# Patient Record
Sex: Male | Born: 1965 | Race: White | Hispanic: No | Marital: Married | Smoking: Never smoker
Health system: Southern US, Community
[De-identification: ages and names within clinical notes are randomized; demographics above are authoritative.]

## PROBLEM LIST (undated history)

## (undated) DIAGNOSIS — I10 Essential (primary) hypertension: Secondary | ICD-10-CM

## (undated) DIAGNOSIS — C6290 Malignant neoplasm of unspecified testis, unspecified whether descended or undescended: Secondary | ICD-10-CM

## (undated) DIAGNOSIS — N289 Disorder of kidney and ureter, unspecified: Secondary | ICD-10-CM

## (undated) DIAGNOSIS — I639 Cerebral infarction, unspecified: Secondary | ICD-10-CM

## (undated) DIAGNOSIS — I509 Heart failure, unspecified: Secondary | ICD-10-CM

## (undated) DIAGNOSIS — I4891 Unspecified atrial fibrillation: Secondary | ICD-10-CM

## (undated) HISTORY — PX: OTHER SURGICAL HISTORY: SHX169

## (undated) HISTORY — PX: CARDIAC ELECTROPHYSIOLOGY MAPPING AND ABLATION: SHX1292

---

## 2017-08-26 ENCOUNTER — Other Ambulatory Visit: Payer: Self-pay

## 2017-08-26 ENCOUNTER — Emergency Department
Admission: EM | Admit: 2017-08-26 | Discharge: 2017-08-26 | Disposition: A | Discharge status: Home / Self Care | Payer: No Typology Code available for payment source | Attending: Emergency Medicine | Admitting: Emergency Medicine

## 2017-08-26 ENCOUNTER — Emergency Department: Payer: No Typology Code available for payment source

## 2017-08-26 DIAGNOSIS — I482 Chronic atrial fibrillation: Secondary | ICD-10-CM | POA: Insufficient documentation

## 2017-08-26 DIAGNOSIS — I4821 Permanent atrial fibrillation: Secondary | ICD-10-CM

## 2017-08-26 DIAGNOSIS — Z8547 Personal history of malignant neoplasm of testis: Secondary | ICD-10-CM | POA: Insufficient documentation

## 2017-08-26 DIAGNOSIS — Z8673 Personal history of transient ischemic attack (TIA), and cerebral infarction without residual deficits: Secondary | ICD-10-CM | POA: Insufficient documentation

## 2017-08-26 DIAGNOSIS — I509 Heart failure, unspecified: Secondary | ICD-10-CM | POA: Diagnosis not present

## 2017-08-26 DIAGNOSIS — R110 Nausea: Secondary | ICD-10-CM | POA: Insufficient documentation

## 2017-08-26 DIAGNOSIS — R0602 Shortness of breath: Secondary | ICD-10-CM | POA: Diagnosis present

## 2017-08-26 DIAGNOSIS — R002 Palpitations: Secondary | ICD-10-CM

## 2017-08-26 DIAGNOSIS — I110 Hypertensive heart disease with heart failure: Secondary | ICD-10-CM | POA: Insufficient documentation

## 2017-08-26 DIAGNOSIS — R52 Pain, unspecified: Secondary | ICD-10-CM | POA: Diagnosis not present

## 2017-08-26 HISTORY — DX: Heart failure, unspecified: I50.9

## 2017-08-26 HISTORY — DX: Essential (primary) hypertension: I10

## 2017-08-26 HISTORY — DX: Malignant neoplasm of unspecified testis, unspecified whether descended or undescended: C62.90

## 2017-08-26 HISTORY — DX: Cerebral infarction, unspecified: I63.9

## 2017-08-26 HISTORY — DX: Unspecified atrial fibrillation: I48.91

## 2017-08-26 HISTORY — DX: Disorder of kidney and ureter, unspecified: N28.9

## 2017-08-26 LAB — CBC WITH DIFFERENTIAL/PLATELET
BASOS ABS: 0 10*3/uL (ref 0–0.1)
Basophils Relative: 1 %
EOS ABS: 0.1 10*3/uL (ref 0–0.7)
EOS PCT: 2 %
HCT: 45.3 % (ref 40.0–52.0)
HEMOGLOBIN: 15.1 g/dL (ref 13.0–18.0)
LYMPHS ABS: 0.9 10*3/uL — AB (ref 1.0–3.6)
Lymphocytes Relative: 19 %
MCH: 31 pg (ref 26.0–34.0)
MCHC: 33.4 g/dL (ref 32.0–36.0)
MCV: 92.8 fL (ref 80.0–100.0)
Monocytes Absolute: 0.5 10*3/uL (ref 0.2–1.0)
Monocytes Relative: 10 %
NEUTROS PCT: 68 %
Neutro Abs: 3.2 10*3/uL (ref 1.4–6.5)
Platelets: 137 10*3/uL — ABNORMAL LOW (ref 150–440)
RBC: 4.88 MIL/uL (ref 4.40–5.90)
RDW: 16.4 % — ABNORMAL HIGH (ref 11.5–14.5)
WBC: 4.6 10*3/uL (ref 3.8–10.6)

## 2017-08-26 LAB — COMPREHENSIVE METABOLIC PANEL
ALT: 62 U/L (ref 17–63)
ANION GAP: 12 (ref 5–15)
AST: 83 U/L — AB (ref 15–41)
Albumin: 4.6 g/dL (ref 3.5–5.0)
Alkaline Phosphatase: 87 U/L (ref 38–126)
BILIRUBIN TOTAL: 1.8 mg/dL — AB (ref 0.3–1.2)
BUN: 16 mg/dL (ref 6–20)
CHLORIDE: 106 mmol/L (ref 101–111)
CO2: 17 mmol/L — ABNORMAL LOW (ref 22–32)
Calcium: 9.6 mg/dL (ref 8.9–10.3)
Creatinine, Ser: 1.38 mg/dL — ABNORMAL HIGH (ref 0.61–1.24)
GFR calc Af Amer: >60 mL/min (ref >60)
GFR calc non Af Amer: 59 mL/min — ABNORMAL LOW (ref >60)
Glucose, Bld: 105 mg/dL — ABNORMAL HIGH (ref 65–99)
POTASSIUM: 5.2 mmol/L — AB (ref 3.5–5.1)
Sodium: 135 mmol/L (ref 135–145)
TOTAL PROTEIN: 8.1 g/dL (ref 6.5–8.1)

## 2017-08-26 LAB — BRAIN NATRIURETIC PEPTIDE: B NATRIURETIC PEPTIDE 5: 38 pg/mL (ref 0.0–100.0)

## 2017-08-26 LAB — TROPONIN I: Troponin I: <0.03 ng/mL (ref <0.03)

## 2017-08-26 LAB — LIPASE, BLOOD: Lipase: 34 U/L (ref 11–51)

## 2017-08-26 MED ORDER — ASPIRIN 81 MG PO CHEW
162.0000 mg | CHEWABLE_TABLET | Freq: Once | ORAL | Status: AC
Start: 2017-08-26 — End: 2017-08-26
  Administered 2017-08-26: 162 mg via ORAL
  Filled 2017-08-26: qty 2

## 2017-08-26 MED ORDER — PROCHLORPERAZINE EDISYLATE 10 MG/2ML IJ SOLN
10.0000 mg | Freq: Once | INTRAMUSCULAR | Status: AC
  Administered 2017-08-26: 10 mg via INTRAVENOUS
  Filled 2017-08-26: qty 2

## 2017-08-26 NOTE — ED Notes (Signed)
Pt stating he is feeling better, states "I'm ready to go home." MD informed. Pt appears more relaxed at this time.

## 2017-08-26 NOTE — ED Provider Notes (Signed)
[facility] Emergency Department Provider Note  ____________________________________________   First MD Initiated Contact with Patient 08/26/17 [phone]     (approximate)  I have reviewed the triage vital signs and the nursing notes.   HISTORY  Chief Complaint Dizziness and Nausea   HPI Dustin Wells is a 52 y.o. male who comes to the emergency department via EMS with shortness of breath, nausea, several episodes of emesis, and lightheadedness for the past several days.  Symptoms came on gradually and have been intermittent.  His dizziness is not "room spinning".  It is not completely constant.  It is somewhat worse when getting up and somewhat improved with sitting down and resting.  He has a long-standing history of atrial fibrillation and CHF and today he took his last dose of Eliquis and he took flecainide most recently 3 days ago.  He denies syncope.  His symptoms are intermittent and moderate in severity when they occur.  Past Medical History:  Diagnosis Date  . Atrial fibrillation ([place])   . CHF (congestive heart failure) ([place])   . Hypertension   . Renal disorder   . Stroke ([place])   . Testicular cancer ([place])     There are no active problems to display for this patient.   Past Surgical History:  Procedure Laterality Date  . cardiac albation    . CARDIAC ELECTROPHYSIOLOGY MAPPING AND ABLATION      Prior to Admission medications   Not on File    Allergies Patient has no known allergies.  History reviewed. No pertinent family history.  Social History Social History   Tobacco Use  . Smoking status: Never Smoker  Substance Use Topics  . Alcohol use: Yes  . Drug use: Not on file    Review of Systems Constitutional: No fever/chills Eyes: No visual changes. ENT: No sore throat. Cardiovascular: Denies chest pain. Respiratory: Positive for shortness of breath. Gastrointestinal: No abdominal pain.  Positive for nausea, positive for vomiting.   No diarrhea.  No constipation. Genitourinary: Negative for dysuria. Musculoskeletal: Negative for back pain. Skin: Negative for rash. Neurological: Negative for headaches, focal weakness or numbness.   ____________________________________________   PHYSICAL EXAM:  VITAL SIGNS: ED Triage Vitals [08/26/17 0944]  Enc Vitals Group     BP (!) 160/98     Pulse Rate 78     Resp 16     Temp 98 F (36.7 C)     Temp Source Oral     SpO2 95 %     Weight      Height      Head Circumference      Peak Flow      Pain Score      Pain Loc      Pain Edu?      Excl. in [place]?     Constitutional: Alert and oriented x4 although somewhat uncomfortable appearing Eyes: PERRL EOMI. no nystagmus Head: Atraumatic. Nose: No congestion/rhinnorhea. Mouth/Throat: No trismus Neck: No stridor.   Cardiovascular: Irregularly irregular and slightly bradycardic Respiratory: Lightly increased respiratory effort.  No retractions. Lungs CTAB and moving good air Gastrointestinal: Soft nondistended nontender no rebound or guarding no peritonitis Musculoskeletal: No lower extremity edema   Neurologic:  Normal speech and language. No gross focal neurologic deficits are appreciated. Skin:  Skin is warm, dry and intact. No rash noted. Psychiatric: Anxious appearing    ____________________________________________   DIFFERENTIAL includes but not limited to  Atrial fibrillation, rapid ventricular response, SVT, metabolic arrangement, ventricular  tachycardia, acute coronary syndrome, panic attack ____________________________________________   LABS (all labs ordered are listed, but only abnormal results are displayed)  Labs Reviewed  COMPREHENSIVE METABOLIC PANEL - Abnormal; Notable for the following components:      Result Value   Potassium 5.2 (*)    CO2 17 (*)    Glucose, Bld 105 (*)    Creatinine, Ser 1.38 (*)    AST 83 (*)    Total Bilirubin 1.8 (*)    GFR calc non Af Amer 59 (*)    All other  components within normal limits  CBC WITH DIFFERENTIAL/PLATELET - Abnormal; Notable for the following components:   RDW 16.4 (*)    Platelets 137 (*)    Lymphs Abs 0.9 (*)    All other components within normal limits  TROPONIN I  LIPASE, BLOOD  BRAIN NATRIURETIC PEPTIDE    Lab work reviewed by me with no acute disease and no signs of acute ischemia __________________________________________  EKG  ED ECG REPORT I, Darel Hong, the attending physician, personally viewed and interpreted this ECG.  Date: 08/26/2017 EKG Time:  Rate: 68 Rhythm: Atrial fibrillation QRS Axis: normal Intervals: normal ST/T Wave abnormalities: normal Narrative Interpretation: no evidence of acute ischemia  ____________________________________________  RADIOLOGY  Chest x-ray reviewed by me with no acute disease ____________________________________________   PROCEDURES  Procedure(s) performed: no  Procedures  Critical Care performed: no  Observation: no ____________________________________________   INITIAL IMPRESSION / ASSESSMENT AND PLAN / ED COURSE  Pertinent labs & imaging results that were available during my care of the patient were reviewed by me and considered in my medical decision making (see chart for details).     ----------------------------------------- 9:45 AM on 08/26/2017 -----------------------------------------  The patient arrives uncomfortable appearing with elevated respiratory rate although with clear lungs.  He is in atrial fibrillation although rate controlled at this point.  Broad work-up is pending including chest x-ray cardiac enzymes, electrolytes.  Will reevaluate thereafter.  The patient's work-up is fortunately reassuring.  He does not need refills of any of his medications as they are waiting for him at the pharmacy.  On further discussion the patient states he has had significant anxiety and he feels like he was having a panic attack earlier which  actually sounds about right.  At this point he is medically stable for outpatient management verbalizes understanding agreement the plan.  ____________________________________________   FINAL CLINICAL IMPRESSION(S) / ED DIAGNOSES  Final diagnoses:  Pain  Nausea  Palpitations  Permanent atrial fibrillation ([place])      NEW MEDICATIONS STARTED DURING THIS VISIT:  There are no discharge medications for this patient.    Note:  This document was prepared using Dragon voice recognition software and may include unintentional dictation errors.     Darel Hong, MD 08/28/17 1351

## 2017-08-26 NOTE — Discharge Instructions (Signed)
It was a pleasure to take care of you today, and thank you for coming to our emergency department.  If you have any questions or concerns before leaving please ask the nurse to grab me and I'm more than happy to go through your aftercare instructions again.  If you were prescribed any opioid pain medication today such as Norco, Vicodin, Percocet, morphine, hydrocodone, or oxycodone please make sure you do not drive when you are taking this medication as it can alter your ability to drive safely.  If you have any concerns once you are home that you are not improving or are in fact getting worse before you can make it to your follow-up appointment, please do not hesitate to call 911 and come back for further evaluation.  Darel Hong, MD  Results for orders placed or performed during the hospital encounter of 08/26/17  Comprehensive metabolic panel  Result Value Ref Range   Sodium 135 135 - 145 mmol/L   Potassium 5.2 (H) 3.5 - 5.1 mmol/L   Chloride 106 101 - 111 mmol/L   CO2 17 (L) 22 - 32 mmol/L   Glucose, Bld 105 (H) 65 - 99 mg/dL   BUN 16 6 - 20 mg/dL   Creatinine, Ser 1.38 (H) 0.61 - 1.24 mg/dL   Calcium 9.6 8.9 - 10.3 mg/dL   Total Protein 8.1 6.5 - 8.1 g/dL   Albumin 4.6 3.5 - 5.0 g/dL   AST 83 (H) 15 - 41 U/L   ALT 62 17 - 63 U/L   Alkaline Phosphatase 87 38 - 126 U/L   Total Bilirubin 1.8 (H) 0.3 - 1.2 mg/dL   GFR calc non Af Amer 59 (L) >60 mL/min   GFR calc Af Amer >60 >60 mL/min   Anion gap 12 5 - 15  Troponin I  Result Value Ref Range   Troponin I <0.03 <0.03 ng/mL  Lipase, blood  Result Value Ref Range   Lipase 34 11 - 51 U/L  Brain natriuretic peptide  Result Value Ref Range   B Natriuretic Peptide 38.0 0.0 - 100.0 pg/mL  CBC with Differential  Result Value Ref Range   WBC 4.6 3.8 - 10.6 K/uL   RBC 4.88 4.40 - 5.90 MIL/uL   Hemoglobin 15.1 13.0 - 18.0 g/dL   HCT 45.3 40.0 - 52.0 %   MCV 92.8 80.0 - 100.0 fL   MCH 31.0 26.0 - 34.0 pg   MCHC 33.4 32.0 - 36.0  g/dL   RDW 16.4 (H) 11.5 - 14.5 %   Platelets 137 (L) 150 - 440 K/uL   Neutrophils Relative % 68 %   Neutro Abs 3.2 1.4 - 6.5 K/uL   Lymphocytes Relative 19 %   Lymphs Abs 0.9 (L) 1.0 - 3.6 K/uL   Monocytes Relative 10 %   Monocytes Absolute 0.5 0.2 - 1.0 K/uL   Eosinophils Relative 2 %   Eosinophils Absolute 0.1 0 - 0.7 K/uL   Basophils Relative 1 %   Basophils Absolute 0.0 0 - 0.1 K/uL   Dg Chest Port 1 View  Result Date: 08/26/2017 CLINICAL DATA:  Shortness of breath EXAM: PORTABLE CHEST 1 VIEW COMPARISON:  None. FINDINGS: Mild elevation of the left diaphragm. The lungs are clear. Normal heart size and mediastinal contours. IMPRESSION: 1. No evidence of active disease. 2. Mild elevation of the left diaphragm. Electronically Signed   By: Monte Fantasia M.D.   On: 08/26/2017 10:54   US Abdomen Limited Ruq  Result Date: 08/26/2017  CLINICAL DATA:  Abdominal pain with nausea vomiting for 1 day EXAM: ULTRASOUND ABDOMEN LIMITED RIGHT UPPER QUADRANT COMPARISON:  None. FINDINGS: Gallbladder: No gallstones or wall thickening visualized. No sonographic Murphy sign noted by sonographer. Common bile duct: Diameter: 4 mm Liver: No focal lesion identified. Within normal limits in parenchymal echogenicity. Portal vein is patent on color Doppler imaging with normal direction of blood flow towards the liver. IMPRESSION: Negative for gallstones or biliary dilatation. No acute finding by ultrasound. Electronically Signed   By: Jerilynn Mages.  Shick M.D.   On: 08/26/2017 11:46

## 2017-08-26 NOTE — ED Notes (Signed)
Pt taken to US

## 2017-08-26 NOTE — ED Triage Notes (Signed)
Pt arrives to ED via [facility] for dizziness, SOB, N&V. Pt states hx of a fib and states he has been out of flecanide x few days. Hx stroke, takes eliquis. Denies taking ASA. Alert, oriented, shaking upon arrival. States symptoms began yesterday. Took ativan at home, when he woke up was feeling dizzy. EMS gave 4mg  zofran en route. Arrives 20 L hand. No pain per EMS. Pt had ablation at [facility] April 1st.

## 2019-06-11 ENCOUNTER — Telehealth (HOSPITAL_COMMUNITY): Payer: Self-pay | Admitting: Psychiatry

## 2019-06-11 NOTE — Telephone Encounter (Signed)
D:  Pt referred to CD-IOP per referral coordinator Jonelle Sidle).  A:  Placed call to pt to discuss CD-IOP.  Pt states he would prefer individual counseling instead.  Informed Jonelle Sidle.

## 2019-07-01 ENCOUNTER — Telehealth (HOSPITAL_COMMUNITY): Payer: Self-pay | Admitting: Licensed Clinical Social Worker

## 2019-07-01 ENCOUNTER — Encounter (HOSPITAL_COMMUNITY): Payer: Self-pay | Admitting: Licensed Clinical Social Worker

## 2019-07-01 ENCOUNTER — Other Ambulatory Visit: Payer: Self-pay

## 2019-07-01 ENCOUNTER — Ambulatory Visit (INDEPENDENT_AMBULATORY_CARE_PROVIDER_SITE_OTHER): Payer: 59 | Admitting: Licensed Clinical Social Worker

## 2019-07-01 DIAGNOSIS — F411 Generalized anxiety disorder: Secondary | ICD-10-CM

## 2019-07-01 DIAGNOSIS — F331 Major depressive disorder, recurrent, moderate: Secondary | ICD-10-CM | POA: Diagnosis not present

## 2019-07-01 DIAGNOSIS — F1020 Alcohol dependence, uncomplicated: Secondary | ICD-10-CM | POA: Diagnosis not present
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
## 2019-07-01 NOTE — Telephone Encounter (Signed)
Called patient suggesting he go to detox at [facility] and the social worker there will find him a cd-iop program. CD iop program at [facility] is full and cannot begin until 4/21 or 4/26 per K. Brone, facilitator. lisbeth mackenzie, LCAS

## 2019-07-02 ENCOUNTER — Telehealth (HOSPITAL_COMMUNITY): Payer: Self-pay | Admitting: Licensed Clinical Social Worker

## 2019-07-02 NOTE — Telephone Encounter (Signed)
See notes

## 2019-07-10 ENCOUNTER — Emergency Department
Admission: EM | Admit: 2019-07-10 | Discharge: 2019-07-10 | Disposition: A | Discharge status: Home / Self Care | Payer: 59 | Attending: Student | Admitting: Student

## 2019-07-10 ENCOUNTER — Other Ambulatory Visit: Payer: Self-pay

## 2019-07-10 DIAGNOSIS — I509 Heart failure, unspecified: Secondary | ICD-10-CM | POA: Insufficient documentation

## 2019-07-10 DIAGNOSIS — F10129 Alcohol abuse with intoxication, unspecified: Secondary | ICD-10-CM | POA: Insufficient documentation

## 2019-07-10 DIAGNOSIS — Z79899 Other long term (current) drug therapy: Secondary | ICD-10-CM | POA: Insufficient documentation

## 2019-07-10 DIAGNOSIS — F1010 Alcohol abuse, uncomplicated: Secondary | ICD-10-CM

## 2019-07-10 DIAGNOSIS — I110 Hypertensive heart disease with heart failure: Secondary | ICD-10-CM | POA: Insufficient documentation

## 2019-07-10 DIAGNOSIS — Y905 Blood alcohol level of 100-119 mg/100 ml: Secondary | ICD-10-CM | POA: Diagnosis not present

## 2019-07-10 LAB — CBC
HCT: 42 % (ref 39.0–52.0)
Hemoglobin: 14.2 g/dL (ref 13.0–17.0)
MCH: 34.6 pg — ABNORMAL HIGH (ref 26.0–34.0)
MCHC: 33.8 g/dL (ref 30.0–36.0)
MCV: 102.4 fL — ABNORMAL HIGH (ref 80.0–100.0)
Platelets: 172 10*3/uL (ref 150–400)
RBC: 4.1 MIL/uL — ABNORMAL LOW (ref 4.22–5.81)
RDW: 13.2 % (ref 11.5–15.5)
WBC: 3.5 10*3/uL — ABNORMAL LOW (ref 4.0–10.5)
nRBC: 0 % (ref 0.0–0.2)

## 2019-07-10 LAB — COMPREHENSIVE METABOLIC PANEL
ALT: 74 U/L — ABNORMAL HIGH (ref 0–44)
AST: 106 U/L — ABNORMAL HIGH (ref 15–41)
Albumin: 4 g/dL (ref 3.5–5.0)
Alkaline Phosphatase: 100 U/L (ref 38–126)
Anion gap: 12 (ref 5–15)
BUN: 18 mg/dL (ref 6–20)
CO2: 21 mmol/L — ABNORMAL LOW (ref 22–32)
Calcium: 9.4 mg/dL (ref 8.9–10.3)
Chloride: 105 mmol/L (ref 98–111)
Creatinine, Ser: 1.37 mg/dL — ABNORMAL HIGH (ref 0.61–1.24)
GFR calc Af Amer: >60 mL/min (ref >60)
GFR calc non Af Amer: 59 mL/min — ABNORMAL LOW (ref >60)
Glucose, Bld: 110 mg/dL — ABNORMAL HIGH (ref 70–99)
Potassium: 3.9 mmol/L (ref 3.5–5.1)
Sodium: 138 mmol/L (ref 135–145)
Total Bilirubin: 0.7 mg/dL (ref 0.3–1.2)
Total Protein: 7.9 g/dL (ref 6.5–8.1)

## 2019-07-10 LAB — URINE DRUG SCREEN, QUALITATIVE (ARMC ONLY)
Amphetamines, Ur Screen: NOT DETECTED
Barbiturates, Ur Screen: NOT DETECTED
Benzodiazepine, Ur Scrn: POSITIVE — AB
Cannabinoid 50 Ng, Ur ~~LOC~~: NOT DETECTED
Cocaine Metabolite,Ur ~~LOC~~: NOT DETECTED
MDMA (Ecstasy)Ur Screen: NOT DETECTED
Methadone Scn, Ur: NOT DETECTED
Opiate, Ur Screen: NOT DETECTED
Phencyclidine (PCP) Ur S: NOT DETECTED
Tricyclic, Ur Screen: POSITIVE — AB

## 2019-07-10 LAB — ETHANOL: Alcohol, Ethyl (B): 102 mg/dL — ABNORMAL HIGH (ref <10)

## 2019-07-10 MED ORDER — LORAZEPAM 2 MG PO TABS: 0.0000 mg | ORAL_TABLET | Freq: Four times a day (QID) | ORAL | Status: DC

## 2019-07-10 MED ORDER — FOLIC ACID 1 MG PO TABS
1.0000 mg | ORAL_TABLET | Freq: Once | ORAL | Status: AC
  Administered 2019-07-10: 1 mg via ORAL
  Filled 2019-07-10: qty 1

## 2019-07-10 MED ORDER — LACTATED RINGERS IV BOLUS
1000.0000 mL | Freq: Once | INTRAVENOUS | Status: AC
  Administered 2019-07-10: 1000 mL via INTRAVENOUS

## 2019-07-10 MED ORDER — LORAZEPAM 2 MG/ML IJ SOLN: 0.0000 mg | Freq: Two times a day (BID) | INTRAMUSCULAR | Status: DC

## 2019-07-10 MED ORDER — THIAMINE HCL 100 MG PO TABS
100.0000 mg | ORAL_TABLET | Freq: Every day | ORAL | Status: DC
  Administered 2019-07-10: 100 mg via ORAL
  Filled 2019-07-10: qty 1

## 2019-07-10 MED ORDER — LORAZEPAM 2 MG/ML IJ SOLN: 0.0000 mg | Freq: Four times a day (QID) | INTRAMUSCULAR | Status: DC

## 2019-07-10 MED ORDER — LORAZEPAM 2 MG PO TABS: 0.0000 mg | ORAL_TABLET | Freq: Two times a day (BID) | ORAL | Status: DC

## 2019-07-10 MED ORDER — THIAMINE HCL 100 MG/ML IJ SOLN: 100.0000 mg | Freq: Every day | INTRAMUSCULAR | Status: DC

## 2019-07-10 MED ORDER — ONDANSETRON HCL 4 MG/2ML IJ SOLN
4.0000 mg | Freq: Once | INTRAMUSCULAR | Status: AC
  Administered 2019-07-10: 4 mg via INTRAVENOUS
  Filled 2019-07-10: qty 2

## 2019-07-10 MED ORDER — CHLORDIAZEPOXIDE HCL 25 MG PO CAPS: ORAL_CAPSULE | ORAL | 0 refills | Status: DC

## 2019-07-10 MED ORDER — ADULT MULTIVITAMIN W/MINERALS CH
1.0000 | ORAL_TABLET | Freq: Once | ORAL | Status: AC
  Administered 2019-07-10: 1 via ORAL
  Filled 2019-07-10: qty 1

## 2019-07-10 NOTE — Discharge Instructions (Signed)
Thank you for letting us take care of you in the emergency department today.   New medications we have prescribed:  Librium - if/when you are ready to stop drinking alcohol please fill and take this prescription to help with your symptoms.  As we discussed, if you return to drinking alcohol it is extremely important that you stop taking this medication.  Please follow up with: The resources provided to you for outpatient detox.  Good luck!  Please return to the ER for any new or worsening symptoms.

## 2019-07-10 NOTE — ED Notes (Signed)
Pt reports last drink at 1900 yesterday. Pt states that he drinks 4-5 drinks of tequila a day. Pt seeking help for detox.

## 2019-07-10 NOTE — ED Triage Notes (Signed)
Pt states he is her for alcohol detox ,states he drink about 5-6 tequilla drinks a day. Last drink was yesterday at 7pm.

## 2019-07-10 NOTE — BH Assessment (Addendum)
Assessment Note  Dustin Wells is an 54 y.o. male who presented to [facility] ED voluntarily for treatment. Per triage note, Pt stated he is here for alcohol detox, states he drink about 5-6 tequila drinks a day and his last drink to be yesterday at 7pm.  During TTS assessment today, pt presented oriented x 3. Pt was observed to have tremors and shakes with a coherent thought process. Pt confirmed the information provided in the triage note and identified his primary complaint to be detox. Pt reported a history of substance abuse (Alcohol) due to depression and anxiety. During attempt to explore pt triggers for depression and anxiety pt stated "No specific trigger I'm just an alcoholic". Pt reported drinking 5-6 drinks of Tequila "On the rocks" daily and denied using any other substances. Pt reported his first drink for the day to be 7am and stated "I just drink whatever is in sight for the rest of the day". Pt reported his last drink of tequila to be last night around 7pm. Pt reports an inpatient and outpatient history of substance abuse treatment through [facility] and [place] stated "as soon as I am done I go right back to drinking".  Pt denies any SI/HI or hallucinations. Pt reports plans to follow up with [facility] detox program after he is released today and was also provided other resources to explore as well. Pt was able to confirm his safety upon discharge and provided his wife as a collateral contact.   Collateral contact with wife Dustin Wells, [phone]): Dustin Wells identified her main concern to be pt withdraws from alcohol and his need for professional support to stop drinking safely. Dustin Wells denied any concerns with pt endorsing any SI/HI or hallucinations. Dustin Wells confirmed pt ability to return home and was provided his current disposition.   TTS provided Pt with SA/MH resources with the recommendation to follow up with resources upon discharge   Diagnosis: Alcohol use disorder,  severe   Past Medical History:  Past Medical History:  Diagnosis Date  . Atrial fibrillation ([place])   . CHF (congestive heart failure) ([place])   . Hypertension   . Renal disorder   . Stroke ([place])   . Testicular cancer [facility])     Past Surgical History:  Procedure Laterality Date  . cardiac albation    . CARDIAC ELECTROPHYSIOLOGY MAPPING AND ABLATION      Family History: No family history on file.  Social History:  reports that he has never smoked. He has never used smokeless tobacco. He reports current alcohol use of about 28.0 standard drinks of alcohol per week. He reports previous drug use.  Additional Social History:  Alcohol / Drug Use Pain Medications: see mar Prescriptions: see mar Over the Counter: see mar History of alcohol / drug use?: Yes Substance #1 Name of Substance 1: Alcohol 1 - Amount (size/oz): 5-6 drinks of tequlia straight 1 - Frequency: daily 1 - Last Use / Amount: 7pm last night  CIWA: CIWA-Ar BP: (!) 160/108 Pulse Rate: (!) 103 Nausea and Vomiting: no nausea and no vomiting Tactile Disturbances: none Tremor: no tremor Auditory Disturbances: not present Paroxysmal Sweats: no sweat visible Visual Disturbances: not present Anxiety: no anxiety, at ease Headache, Fullness in Head: none present Agitation: normal activity Orientation and Clouding of Sensorium: oriented and can do serial additions CIWA-Ar Total: 0 COWS:    Allergies: No Known Allergies  Home Medications: (Not in a hospital admission)   OB/GYN Status:  No LMP for male  patient.  General Assessment Data Location of Assessment: [facility] ED TTS Assessment: In system Is this a Tele or Face-to-Face Assessment?: Face-to-Face Is this an Initial Assessment or a Re-assessment for this encounter?: Initial Assessment Patient Accompanied by:: N/A Language Other than English: No Living Arrangements: Other (Comment)(Private Home ) What gender do you identify as?: Male Marital status:  Married Living Arrangements: Spouse/significant other Can pt return to current living arrangement?: Yes Admission Status: Voluntary Is patient capable of signing voluntary admission?: Yes Referral Source: Self/Family/Friend Insurance type: Landscape [employer] Exam ([place]) Medical Exam completed: Yes  Crisis Care Plan Living Arrangements: Spouse/significant other  Education Status Is patient currently in school?: No  Risk to self with the past 6 months Suicidal Ideation: No Has patient been a risk to self within the past 6 months prior to admission? : No Suicidal Intent: No Has patient had any suicidal intent within the past 6 months prior to admission? : No Is patient at risk for suicide?: No Suicidal Plan?: No Has patient had any suicidal plan within the past 6 months prior to admission? : No Access to Means: No What has been your use of drugs/alcohol within the last 12 months?: Alcohol  Previous Attempts/Gestures: No Triggers for Past Attempts: None known Intentional Self Injurious Behavior: None Family Suicide History: Unknown Recent stressful life event(s): Other (Comment)(DUI ) Persecutory voices/beliefs?: No Depression: Yes Depression Symptoms: Feeling worthless/self pity, Guilt Substance abuse history and/or treatment for substance abuse?: Yes Suicide prevention information given to non-admitted patients: Yes  Risk to Others within the past 6 months Homicidal Ideation: No Does patient have any lifetime risk of violence toward others beyond the six months prior to admission? : No Thoughts of Harm to Others: No Current Homicidal Intent: No Current Homicidal Plan: No Access to Homicidal Means: No History of harm to others?: No Assessment of Violence: None Noted Does patient have access to weapons?: No Criminal Charges Pending?: Yes(DUI) Describe Pending Criminal Charges: DUI Does patient have a court date: Yes Court Date: 10/14/19 Is patient  on probation?: Unknown  Psychosis Hallucinations: None noted Delusions: None noted  Mental Status Report Appearance/Hygiene: In scrubs Eye Contact: Good Motor Activity: Unremarkable Speech: Logical/coherent Level of Consciousness: Alert Mood: Depressed, Anxious Affect: Anxious, Depressed Anxiety Level: Moderate Thought Processes: Coherent Judgement: Partial Orientation: Person, Place, Time, Situation     ADLScreening [facility] Assessment Services) Patient's cognitive ability adequate to safely complete daily activities?: Yes Patient able to express need for assistance with ADLs?: Yes Independently performs ADLs?: Yes (appropriate for developmental age)  Prior Inpatient Therapy Prior Inpatient Therapy: Yes Prior Therapy Dates: May 2019 Prior Therapy Facilty/Provider(s): [facility] Reason for Treatment: alcohol   Prior Outpatient Therapy Prior Outpatient Therapy: Yes Prior Therapy Dates: June-july 2020 Prior Therapy Facilty/Provider(s): [facility]  Reason for Treatment: alcohol Does patient have an ACCT team?: No Does patient have Intensive In-House Services?  : No Does patient have [facility] services? : No Does patient have P4CC services?: No  ADL Screening (condition at time of admission) Patient's cognitive ability adequate to safely complete daily activities?: Yes Is the patient deaf or have difficulty hearing?: No Does the patient have difficulty seeing, even when wearing glasses/contacts?: No Does the patient have difficulty concentrating, remembering, or making decisions?: No Patient able to express need for assistance with ADLs?: Yes Does the patient have difficulty dressing or bathing?: No Independently performs ADLs?: Yes (appropriate for developmental age) Does the patient have difficulty walking or  climbing stairs?: No Weakness of Legs: None Weakness of Arms/Hands: None  Home Assistive Devices/Equipment Home Assistive  Devices/Equipment: None  Therapy Consults (therapy consults require a physician order) PT Evaluation Needed: No OT Evalulation Needed: No SLP Evaluation Needed: No Abuse/Neglect Assessment (Assessment to be complete while patient is alone) Abuse/Neglect Assessment Can Be Completed: Yes Physical Abuse: Denies Verbal Abuse: Denies Sexual Abuse: Denies Exploitation of patient/patient's resources: Denies Self-Neglect: Denies Values / Beliefs Cultural Requests During Hospitalization: None Spiritual Requests During Hospitalization: None Consults Spiritual Care Consult Needed: No Transition of Care Team Consult Needed: No Advance Directives (For Healthcare) Does Patient Have a Medical Advance Directive?: No Would patient like information on creating a medical advance directive?: No - Patient declined          Disposition:  Disposition Initial Assessment Completed for this Encounter: Yes Patient referred to: Other (Comment)(provided resources for substance abuse )  On Site Evaluation by:   Reviewed with Physician:    Shanon Ace 07/10/2019 1:51 PM

## 2019-07-10 NOTE — TOC Initial Note (Addendum)
Transition of Care [facility]) - Initial/Assessment Note    Patient Details  Name: Krieg Busic MRN: [id number] Date of Birth: 04-Nov-1965  Transition [facility]) CM/SW Contact:    Ova Freshwater Phone Number: [phone] 07/10/2019, 9:43 AM  Clinical Narrative:                  Patient presents in the [facility]/ED seeking alcohol detox.  TOC is aware of this consult. Reaching out to in-patient and out patient rehab facilities for availability.       Patient Goals and CMS Choice        Expected Discharge Plan and Services                                                Prior Living Arrangements/Services                       Activities of Daily Living      Permission Sought/Granted                  Emotional Assessment              Admission diagnosis:  detox Patient Active Problem List   Diagnosis Date Noted  . Alcohol use disorder, severe, dependence ([place]) 07/01/2019  . MDD (major depressive disorder), recurrent episode, moderate ([place]) 07/01/2019  . GAD (generalized anxiety disorder) 07/01/2019   PCP:  Leonel Ramsay, MD Pharmacy:  No Pharmacies Listed    Social Determinants of Health (SDOH) Interventions    Readmission Risk Interventions No flowsheet data found.

## 2019-07-10 NOTE — ED Provider Notes (Signed)
[facility] Emergency Department Provider Note  ____________________________________________   First MD Initiated Contact with Patient 07/10/19 [phone]     (approximate)  I have reviewed the triage vital signs and the nursing notes.  History  Chief Complaint Alcohol Problem    HPI Dustin Wells is a 54 y.o. male with hx of AF, HF, HTN, stroke, testicular cancer, alcohol use disorder, depression, GAD who presents for alcohol detox.   Patient states he typically drinks 4-5 tequila drinks daily. He denies any history of withdrawal seizures, but does state in the mornings he does get a little bit shaky, clammy, and nauseous. At present he feels slightly shaky since waking up this AM but otherwise denies any other symptoms. Last drink was last night around 1900.  He was recently seen by behavioral health outpatient therapy at [facility], who recommend he present to the emergency department for clearance and social work referral for an intensive outpatient program.  Confirmed this on chart review.   Past Medical Hx Past Medical History:  Diagnosis Date  . Atrial fibrillation ([place])   . CHF (congestive heart failure) ([place])   . Hypertension   . Renal disorder   . Stroke ([place])   . Testicular cancer [facility])     Problem List Patient Active Problem List   Diagnosis Date Noted  . Alcohol use disorder, severe, dependence ([place]) 07/01/2019  . MDD (major depressive disorder), recurrent episode, moderate ([place]) 07/01/2019  . GAD (generalized anxiety disorder) 07/01/2019    Past Surgical Hx Past Surgical History:  Procedure Laterality Date  . cardiac albation    . CARDIAC ELECTROPHYSIOLOGY MAPPING AND ABLATION      Medications Prior to Admission medications   Medication Sig Start Date End Date Taking? Authorizing Provider  escitalopram (LEXAPRO) 10 MG tablet Take 10 mg by mouth daily.    [provider]  LORazepam (ATIVAN) 0.5 MG tablet Take 0.5 mg by mouth  every 8 (eight) hours.    [provider]    Allergies Patient has no known allergies.  Family Hx No family history on file.  Social Hx Social History   Tobacco Use  . Smoking status: Never Smoker  . Smokeless tobacco: Never Used  Substance Use Topics  . Alcohol use: Yes    Alcohol/week: 28.0 standard drinks    Types: 28 Standard drinks or equivalent per week  . Drug use: Not Currently     Review of Systems  Constitutional: Negative for fever. Negative for chills. + shaky Eyes: Negative for visual changes. ENT: Negative for sore throat. Cardiovascular: Negative for chest pain. Respiratory: Negative for shortness of breath. Gastrointestinal: Negative for nausea. Negative for vomiting.  Genitourinary: Negative for dysuria. Musculoskeletal: Negative for leg swelling. Skin: Negative for rash. Neurological: Negative for headaches.   Physical Exam  Vital Signs: ED Triage Vitals  Enc Vitals Group     BP 07/10/19 0713 (!) 148/119     Pulse Rate 07/10/19 0713 (!) 115     Resp 07/10/19 0713 18     Temp 07/10/19 0717 98 F (36.7 C)     Temp Source 07/10/19 0713 Oral     SpO2 07/10/19 0713 94 %     Weight 07/10/19 0714 235 lb (106.6 kg)     Height 07/10/19 0714 6' (1.829 m)     Head Circumference --      Peak Flow --      Pain Score 07/10/19 0714 0     Pain Loc --  Pain Edu? --      Excl. in [place]? --     Constitutional: Alert and oriented. Well appearing. NAD.  Head: Normocephalic. Atraumatic. Eyes: Conjunctivae clear. Sclera anicteric. Pupils equal and symmetric. Nose: No masses or lesions. No congestion or rhinorrhea. Mouth/Throat: Wearing mask.  Neck: No stridor. Trachea midline.  Cardiovascular: Tachycardic, regular rhythm. Extremities well perfused. Respiratory: Normal respiratory effort.  Lungs CTAB. Gastrointestinal: Soft. Non-distended. Non-tender.  Genitourinary: Deferred. Musculoskeletal: No lower extremity edema. No  deformities. Neurologic:  Normal speech and language. No gross focal or lateralizing neurologic deficits are appreciated. Very mild tremors of hands.  Skin: Skin is warm, dry and intact. No rash noted. Psychiatric: Mood and affect are appropriate for situation.  EKG  N/A    Radiology  N/A   Procedures  Procedure(s) performed (including critical care):  Procedures   Initial Impression / Assessment and Plan / MDM / ED Course  54 y.o. male who presents to the ED for assistance with alcohol detox and intensive outpatient program referral  Ddx: alcohol abuse disorder, alcohol dependence, alcohol withdrawal.   Will plan for labs, fluids, thiamine/folic acid, CIWA protocol, and consult TTS and SW/CM for assistance.  AST and ALT mildly elevated and pattern consistent with alcohol use.  MCV mildly elevated also consistent with alcohol use.  Alcohol 102.  Patient has been seen by TTS and provided outpatient resources.  He has remained stable throughout his observation.  In the ED.  Has not required any Ativan.  Will provide an Rx for Librium taper once/if the patient elects to commit to alcohol cessation.  Discussed strict adherence to the taper as well as strict avoidance of alcohol while on the taper.  If he resumes drinking he should stop taking the Librium.  He voices clear understanding of this.  Advise follow-up with the resources provided and given return precautions.  _______________________________   As part of my medical decision making I have reviewed available labs, reviewed old records/performed chart review, and discussed with consultants (TS/case management).     Final Clinical Impression(s) / ED Diagnosis  Final diagnoses:  Alcohol abuse       Note:  This document was prepared using Dragon voice recognition software and may include unintentional dictation errors.   Lilia Pro., MD 07/11/19 [phone]

## 2019-07-11 ENCOUNTER — Telehealth (HOSPITAL_COMMUNITY): Payer: Self-pay | Admitting: Licensed Clinical Social Worker

## 2019-07-19 ENCOUNTER — Other Ambulatory Visit: Payer: Self-pay

## 2019-07-19 ENCOUNTER — Ambulatory Visit (INDEPENDENT_AMBULATORY_CARE_PROVIDER_SITE_OTHER): Payer: 59 | Admitting: Licensed Clinical Social Worker

## 2019-07-19 DIAGNOSIS — F330 Major depressive disorder, recurrent, mild: Secondary | ICD-10-CM

## 2019-07-19 DIAGNOSIS — F1010 Alcohol abuse, uncomplicated: Secondary | ICD-10-CM

## 2019-07-19 DIAGNOSIS — F1994 Other psychoactive substance use, unspecified with psychoactive substance-induced mood disorder: Secondary | ICD-10-CM
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
## 2019-10-03 IMAGING — DX DG CHEST 1V PORT
1 series · 1 of 1 positions shown · non-contrast
Comparison: None.

CLINICAL DATA: Shortness of breath

EXAM:
PORTABLE CHEST 1 VIEW

[chest ap]
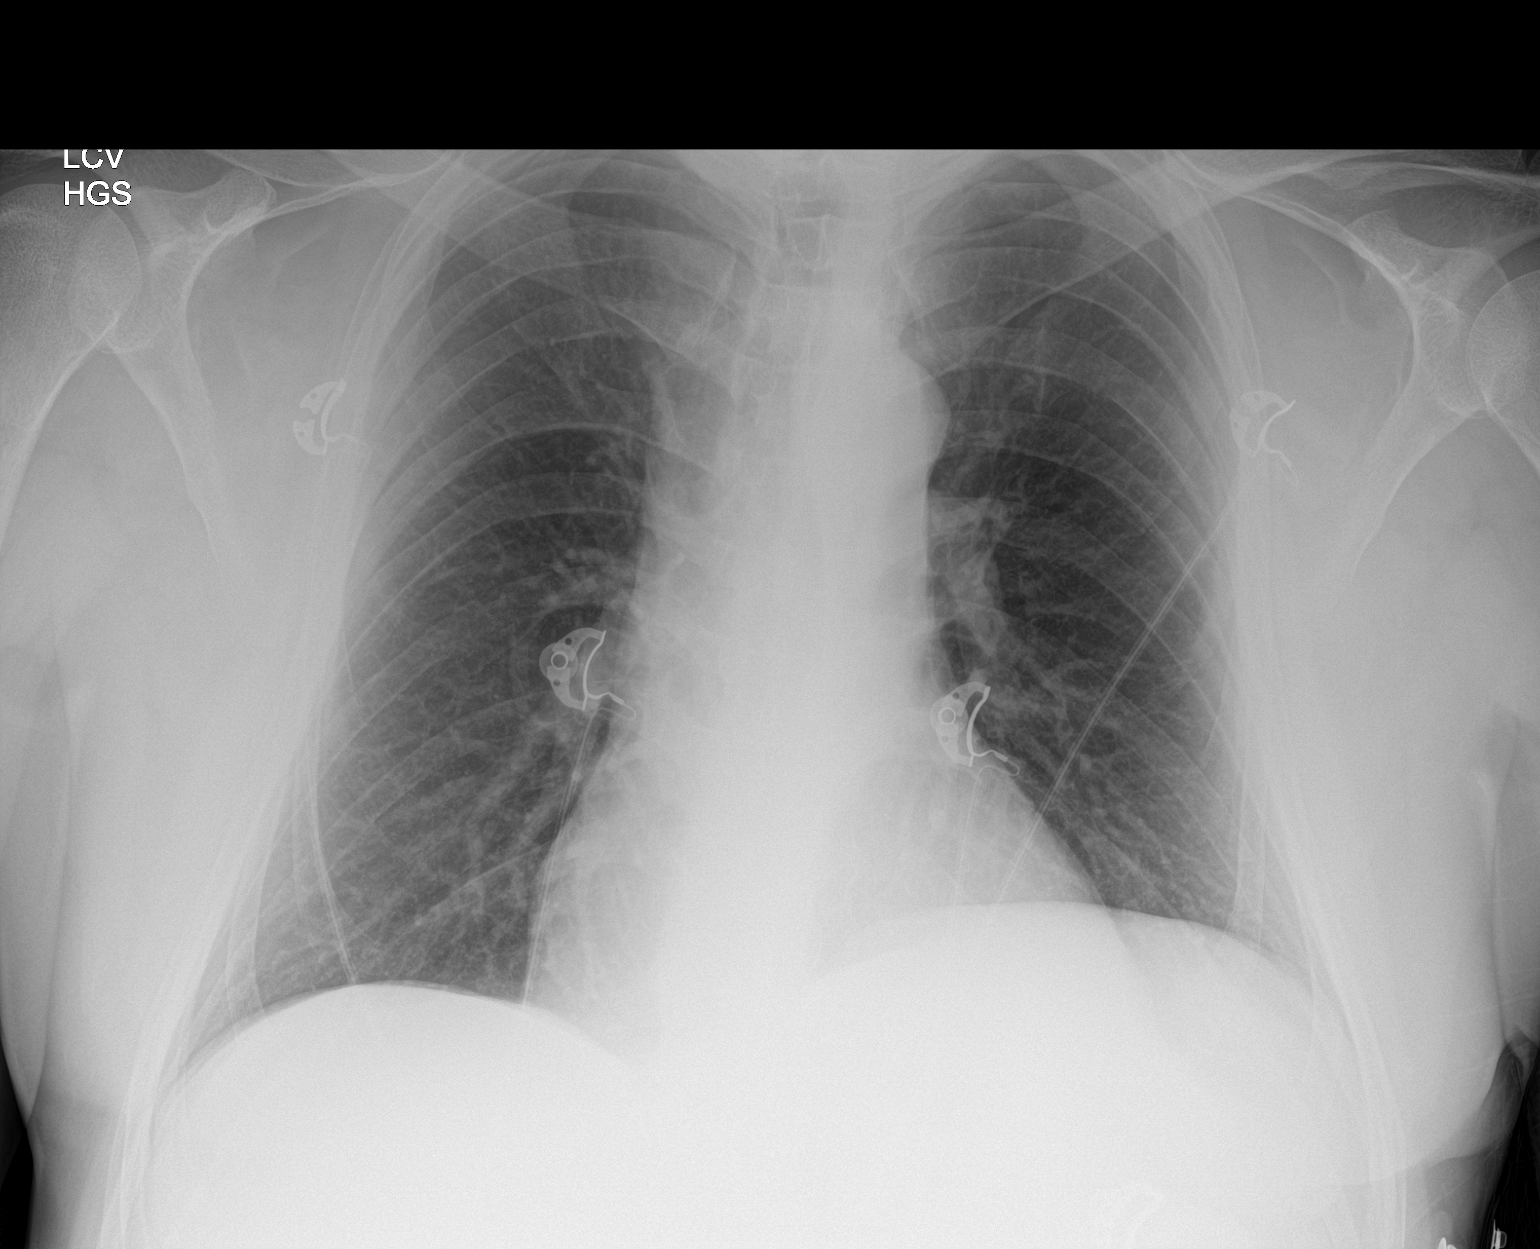

[1 of 1 positions shown; findings below may reference images not displayed]

FINDINGS: Mild elevation of the left diaphragm. The lungs are clear. Normal
heart size and mediastinal contours.
IMPRESSION: 1. No evidence of active disease.
2. Mild elevation of the left diaphragm.

## 2020-01-16 ENCOUNTER — Other Ambulatory Visit: Payer: Self-pay | Admitting: Acute Care

## 2020-01-16 DIAGNOSIS — I639 Cerebral infarction, unspecified: Secondary | ICD-10-CM

## 2020-01-31 ENCOUNTER — Other Ambulatory Visit: Payer: Self-pay

## 2020-01-31 ENCOUNTER — Ambulatory Visit
Admission: RE | Admit: 2020-01-31 | Discharge: 2020-01-31 | Disposition: A | Discharge status: Home / Self Care | Payer: 59 | Source: Ambulatory Visit | Attending: Acute Care | Admitting: Acute Care

## 2020-01-31 DIAGNOSIS — I639 Cerebral infarction, unspecified: Secondary | ICD-10-CM | POA: Diagnosis not present

## 2021-01-08 ENCOUNTER — Encounter: Payer: 59 | Attending: Infectious Diseases | Admitting: Dietician

## 2021-01-08 ENCOUNTER — Encounter: Payer: Self-pay | Admitting: Dietician

## 2021-01-08 ENCOUNTER — Other Ambulatory Visit: Payer: Self-pay

## 2021-01-08 VITALS — Ht 72.0 in | Wt 279.0 lb

## 2021-01-08 DIAGNOSIS — R7303 Prediabetes: Secondary | ICD-10-CM | POA: Diagnosis present

## 2021-01-08 NOTE — Progress Notes (Signed)
Medical Nutrition Therapy: Visit start time: 0845  end time: 0955  Assessment:  Diagnosis: obesity Past medical history: hypertension, hyperlipidemia, CHF, Afib, CVA, testicular CA, GERD, CKD stage 3, pre-diabetes Psychosocial issues/ stress concerns: alcohol use disorder, anxiety, depression  Preferred learning method:  Hands-on   Current weight: 279.0lbs with shoes  Height: 6'0" BMI: 37.84 Medications, supplements: reconciled list in medical record  Progress and evaluation:  Patient reports gradual weight gain for past year; since participating in detox program. His personal goal is to lose at least 40lbs.  Wife is participating in [employer], so meals at home are healthier, balanced Reports intolerance to milk, GI distress along with strong dislike. Does eat cheese. Renal lab results indicate GFR 46 (10/07/20); 53 04/28/20; sodium, potassium normal Recent HbA1C at 5.9% (10/07/20)  Physical activity: no regular exercise; light activity throughout work shift 3 days a week  Dietary Intake:  Usual eating pattern includes 2-3 meals and 2-3 snacks per day. Dining out frequency: 3 meals per week.  Breakfast: eats 1-2x a week (depends on work schedule) eggs,bacon, toast Snack: chips; cheese; salami; nuts almonds, cashews Lunch: if at work -- [employer]; occ salad; food from [employer] ie fried chicken, salad, variety Snack: same as am Supper: 10/20 brisket mac and cheese, collard greens; lasagna; meat loaf; spaghetti; fish once a week Snack: same as am, or ice cream sandwich or drumstick Beverages: water, occ soda, unsweetened tea, alcohol -- 3 shots Tequila daily  Nutrition Care Education: Topics covered:  Basic nutrition: basic food groups, appropriate nutrient balance, appropriate meal and snack schedule, general nutrition guidelines    Weight control: importance of low sugar and low fat choices, portion control strategies including slow eating, using smaller plates,  increasing low carb veg and/or fruits to fill plate, increasing fluid intake during meals, choosing high fiber foods; estimated energy needs for weight loss at 1700-1800 kcal, provided guidance for 45% CHO, 25% pro, 30% fat Pre-Diabetes:  appropriate meal and snack schedule; appropriate carb intake and balance, healthy carb choices Hypertension: identifying high sodium foods, identifying food sources of potassium, magnesium Other lifestyle changes:  alcohol use, caloric content of [place]   Nutritional Diagnosis:  [place]-2.2 Altered nutrition-related laboratory As related to CKD, pre-diabetes.  As evidenced by low GFR, elevated HbA1C. [place]-3.3 Overweight/obesity As related to excess calories, inadequate physical activity.  As evidenced by patient with current BMI of 37.84. NI-4.3 Excessive alcohol intake As related to alcohol use disorder.  As evidenced by consumption of 3 shots hard liquor daily.  Intervention:  Instruction and discussion as noted above. Patient has been working on dietary and lifestyle improvements, with support from spouse. He is motivated to continue.  Established nutrition goals with direction from patient. He would like a visit with [facility] counselor as part of the MNT program. Counseling staff will contact patient to schedule.  Education Materials given:  Museum/gallery conservator with food lists, sample meal pattern Sample menus Visit summary with goals/ instructions   Learner/ who was taught:  Patient    Level of understanding: Verbalizes/ demonstrates competency   Demonstrated degree of understanding via:   Teach back Learning barriers: None  Willingness to learn/ readiness for change: Eager, change in progress   Monitoring and Evaluation:  Dietary intake, exercise, alcohol intake, and body weight      follow up:  03/05/21 at 9:15am

## 2021-01-08 NOTE — Patient Instructions (Addendum)
Decrease portions of starchy foods and meats; increase veg and fruits to feel full enough after meals. Keep starch portions to the size of a fist, meat portions to palm-size (average) Incorporate "tricks" as needed to help with eating less and feeling full -- use smaller plates, eat slowly, drink water before and/or during meals. Eat a meal or snack about avery 3-5 hours in the day. Wait at least 2-3 hours after a meal before eating a snack; avoid stretches longer than 5-6 hours without any food.

## 2021-03-05 ENCOUNTER — Ambulatory Visit: Payer: 59 | Admitting: Dietician

## 2021-04-19 ENCOUNTER — Encounter: Payer: Self-pay | Admitting: Dietician

## 2021-04-19 NOTE — Progress Notes (Signed)
Have not heard back from patient to reschedule his cancelled appointment from 03/05/21. Sent notification to referring provider.

## 2021-06-29 ENCOUNTER — Other Ambulatory Visit: Payer: Self-pay | Admitting: Gastroenterology

## 2021-06-29 DIAGNOSIS — R748 Abnormal levels of other serum enzymes: Secondary | ICD-10-CM

## 2021-07-07 ENCOUNTER — Ambulatory Visit
Admission: RE | Admit: 2021-07-07 | Discharge: 2021-07-07 | Disposition: A | Discharge status: Home / Self Care | Payer: 59 | Source: Ambulatory Visit | Attending: Gastroenterology | Admitting: Gastroenterology

## 2021-07-07 DIAGNOSIS — R748 Abnormal levels of other serum enzymes: Secondary | ICD-10-CM | POA: Insufficient documentation

## 2021-07-08 ENCOUNTER — Other Ambulatory Visit: Payer: 59

## 2021-09-29 ENCOUNTER — Encounter: Payer: Self-pay | Admitting: Urology

## 2021-09-29 ENCOUNTER — Ambulatory Visit (INDEPENDENT_AMBULATORY_CARE_PROVIDER_SITE_OTHER): Payer: 59 | Admitting: Urology

## 2021-09-29 VITALS — BP 126/90 | HR 73 | Ht 72.0 in | Wt 265.0 lb

## 2021-09-29 DIAGNOSIS — Z8547 Personal history of malignant neoplasm of testis: Secondary | ICD-10-CM | POA: Diagnosis not present

## 2021-09-29 DIAGNOSIS — E291 Testicular hypofunction: Secondary | ICD-10-CM

## 2021-09-29 DIAGNOSIS — N529 Male erectile dysfunction, unspecified: Secondary | ICD-10-CM

## 2021-09-29 MED ORDER — CLOMIPHENE CITRATE 50 MG PO TABS: ORAL_TABLET | ORAL | 3 refills | Status: DC

## 2021-09-29 MED ORDER — SILDENAFIL CITRATE 20 MG PO TABS: ORAL_TABLET | ORAL | 3 refills | Status: DC

## 2021-09-29 NOTE — Progress Notes (Signed)
09/29/21 10:12 AM   Dustin Wells March 28, 1965 [id number]  Referring provider:  Leonel Ramsay, MD [place],  [place] [address] Chief Complaint  Patient presents with   Hypogonadism      HPI: Dustin Wells is a 56 y.o.male who presents today for further evaluation of testicular hypofunction.   He  has a personal history of hypertensive chronic kidney disease, benign, with chronic kidney disease stage I through stage IV, or unspecified  and is followed by nephrology. He also has a personal history of testicular cancer in 2013.   His most recent PSA was 0.60 on 07/05/2021. He had a testosterone checked on 08/06/2021 that was 246.2.   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  This was in [place], records are being requested today for documentation purposes.  He reports that his erections ans testosterone symptoms have been ongoing for the last 5 years. He reports that his symptoms consist of lack of energy. He has not had an adequate erection and has not tried any medications for this.  He was noted to have a borderline low testosterone of 242 on 08/06/2021.  PSA 0.6 on 06/2021.  Labs reviewed in care everywhere.  He reports that he has two alcoholic beverages a day.    Androgen Deficiency in the Aging Male     Dustin Wells 09/29/21 0900         Androgen Deficiency in the Aging Male   Do you have a decrease in libido (sex drive) Yes     Do you have lack of energy Yes     Do you have a decrease in strength and/or endurance Yes     Have you lost height No     Have you noticed a decreased "enjoyment of life" Yes     Are you sad and/or grumpy Yes     Are your erections less strong Yes     Have you noticed a recent deterioration in your ability to play sports No     Are you falling asleep after dinner Yes     Has there been a recent deterioration in your work performance No               PMH: Past  Medical History:  Diagnosis Date   Atrial fibrillation ([place])    CHF (congestive heart failure) ([place])    Hypertension    Renal disorder    Stroke [facility])    Testicular cancer [facility])     Surgical History: Past Surgical History:  Procedure Laterality Date   cardiac albation     CARDIAC ELECTROPHYSIOLOGY MAPPING AND ABLATION      Home Medications:  Allergies as of 09/29/2021       Reactions   Rivaroxaban Diarrhea, Nausea Only, Other (See Comments)   Nausea, diarrhea, abdominal pain Nausea, diarrhea, abdominal pain Nausea, diarrhea, abdominal pain   Sulfamethoxazole-trimethoprim Rash        Medication List        Accurate as of September 29, 2021 10:12 AM. If you have any questions, ask your nurse or doctor.          acetaminophen 325 MG tablet Commonly known as: TYLENOL Take by mouth.   allopurinol 300 MG tablet Commonly known as: ZYLOPRIM Take 300 mg by mouth daily.   amLODipine 5 MG tablet Commonly known as: NORVASC Take by mouth.   atorvastatin 40 MG tablet Commonly known as: LIPITOR  Take by mouth.   chlordiazePOXIDE 25 MG capsule Commonly known as: LIBRIUM Day 1: take 50 mg (2 capsules) in AM, afternoon, and evening.  Day 2: take 50 mg (2 capsules) AM and PM.  Day 3: take 50 mg (2 capsules) in AM   Cholecalciferol 25 MCG (1000 UT) tablet Take by mouth.   clomiPHENE 50 MG tablet Commonly known as: CLOMID Take 1/2 tablet daily Started by: Hollice Espy, MD   cyanocobalamin 1000 MCG tablet Take by mouth.   diclofenac Sodium 1 % Gel Commonly known as: VOLTAREN Apply topically.   doxepin 50 MG capsule Commonly known as: SINEQUAN TAKE 1 CAPSULE BY MOUTH EVERY DAY NIGHTLY   Eliquis 5 MG Tabs tablet Generic drug: apixaban 1 tab(s)   ergocalciferol 1.25 MG (50000 UT) capsule Commonly known as: VITAMIN D2 Take by mouth.   escitalopram 10 MG tablet Commonly known as: LEXAPRO Take 10 mg by mouth daily.   flecainide 100 MG tablet Commonly known  as: TAMBOCOR Take 1 tablet by mouth every 12 (twelve) hours.   folic acid 1 MG tablet Commonly known as: FOLVITE Take by mouth.   gabapentin 300 MG capsule Commonly known as: NEURONTIN Take 1-3 tabs at night   LORazepam 0.5 MG tablet Commonly known as: ATIVAN Take 0.5 mg by mouth every 8 (eight) hours.   losartan 100 MG tablet Commonly known as: COZAAR Take by mouth.   magnesium oxide 400 MG tablet Commonly known as: MAG-OX Take 1 tablet by mouth 2 (two) times daily.   metoprolol succinate 100 MG 24 hr tablet Commonly known as: TOPROL-XL Take by mouth.   pantoprazole 40 MG tablet Commonly known as: PROTONIX Take 1 tablet by mouth daily.   sildenafil 20 MG tablet Commonly known as: REVATIO Take 3-5 tablets 1 hr prior to intercourse as needed Started by: Hollice Espy, MD   sodium bicarbonate 650 MG tablet 1 tablet by mouth 2 times a day   torsemide 10 MG tablet Commonly known as: DEMADEX Take by mouth.        Allergies:  Allergies  Allergen Reactions   Rivaroxaban Diarrhea, Nausea Only and Other (See Comments)    Nausea, diarrhea, abdominal pain Nausea, diarrhea, abdominal pain Nausea, diarrhea, abdominal pain    Sulfamethoxazole-Trimethoprim Rash    Family History: No family history on file.  Social History:  reports that he has never smoked. He has never used smokeless tobacco. He reports current alcohol use of about 21.0 standard drinks of alcohol per week. He reports that he does not currently use drugs.   Physical Exam: BP 126/90   Pulse 73   Ht 6' (1.829 m)   Wt 265 lb (120.2 kg)   BMI 35.94 kg/m   Constitutional:  Alert and oriented, No acute distress. HEENT: [place] AT, moist mucus membranes.  Trachea midline, no masses. Cardiovascular: No clubbing, cyanosis, or edema. Respiratory: Normal respiratory effort, no increased work of breathing. GU: Left surgically absent; right testicle is slightly atrophic   Rectal: Normal sphincter tone,  30   CC prostate, smooth no nodules Skin: No rashes, bruises or suspicious lesions. Neurologic: Grossly intact, no focal deficits, moving all 4 extremities. Psychiatric: Normal mood and affect.  Laboratory Data:  Lab Results  Component Value Date   CREATININE 1.37 (H) 07/10/2019     Assessment & Plan:    Hypogonadism  - We discussed the pathophysiology of hypogonadism. We discussed signs of hypogonadism including low libido and fatigue.  - We discussed the risk and  benefits of treating low testosterone. Benefits include improving symptoms discussed today. We discussed treatments with gels versus injections versus Clomid and the side effects such as testicular atrophy, fertility, and if he decided to stop testosterone he would have a period of very low testosterone. Discussed how it can take months to years for the body to replenish testosterone on its own after testosterone therapy.  - He is most interested in Clomid; prescribed - Will have him undergo labs; LH, FSH, prolactin, and testosterone.  -Recheck symptoms, labs, and LFTs in 3 months  2. Erectile dysfunction  - We discussed the pathophysiology of erectile dysfunction today along with possible contributing factors. Discussed possible treatment options including PDE 5 inhibitors, vacuum erectile device, intracavernosal injection, MUSE, and placement of the inflatable or malleable penile prosthesis for refractory cases. - In terms of PDE 5 inhibitors, we discussed contraindications for this medication as well as common side effects. Patient was counseled on optimal use. All of his questions were answered in detail. - Sildenafil; prescribed   3. History of testicular cancer  - Records release signed for records in [place].   Return in about 3 months (around 12/30/2021) for With Labs prior .  Conley Rolls as a Education administrator for Hollice Espy, MD.,have documented all relevant documentation on the behalf of Hollice Espy,  MD,as directed by  Hollice Espy, MD while in the presence of Hollice Espy, MD.  I have reviewed the above documentation for accuracy and completeness, and I agree with the above.   Hollice Espy, MD    [facility] [address], [place], [place] [address] [phone]

## 2021-09-30 LAB — TESTOSTERONE: Testosterone: 189 ng/dL — ABNORMAL LOW (ref 264–916)

## 2021-09-30 LAB — HEPATIC FUNCTION PANEL
ALT: 30 IU/L (ref 0–44)
AST: 31 IU/L (ref 0–40)
Albumin: 4.8 g/dL (ref 3.8–4.9)
Alkaline Phosphatase: 158 IU/L — ABNORMAL HIGH (ref 44–121)
Bilirubin Total: 0.9 mg/dL (ref 0.0–1.2)
Bilirubin, Direct: 0.24 mg/dL (ref 0.00–0.40)
Total Protein: 7.6 g/dL (ref 6.0–8.5)

## 2021-09-30 LAB — URINALYSIS, COMPLETE
Bilirubin, UA: NEGATIVE
Glucose, UA: NEGATIVE
Ketones, UA: NEGATIVE
Leukocytes,UA: NEGATIVE
Nitrite, UA: NEGATIVE
Protein,UA: NEGATIVE
Specific Gravity, UA: 1.015 (ref 1.005–1.030)
Urobilinogen, Ur: 0.2 mg/dL (ref 0.2–1.0)
pH, UA: 5.5 (ref 5.0–7.5)

## 2021-09-30 LAB — MICROSCOPIC EXAMINATION: Bacteria, UA: NONE SEEN

## 2021-09-30 LAB — PROLACTIN: Prolactin: 17.3 ng/mL — ABNORMAL HIGH (ref 4.0–15.2)

## 2021-09-30 LAB — FSH/LH
FSH: 18.3 m[IU]/mL — ABNORMAL HIGH (ref 1.5–12.4)
LH: 11.9 m[IU]/mL — ABNORMAL HIGH (ref 1.7–8.6)

## 2021-10-13 ENCOUNTER — Emergency Department: Payer: 59

## 2021-10-13 ENCOUNTER — Observation Stay (HOSPITAL_BASED_OUTPATIENT_CLINIC_OR_DEPARTMENT_OTHER)
Admission: EM | Admit: 2021-10-13 | Discharge: 2021-10-14 | Disposition: A | Discharge status: Home / Self Care | Payer: 59 | Source: Home / Self Care | Attending: Internal Medicine | Admitting: Internal Medicine

## 2021-10-13 ENCOUNTER — Encounter: Payer: Self-pay | Admitting: Internal Medicine

## 2021-10-13 ENCOUNTER — Other Ambulatory Visit: Payer: Self-pay

## 2021-10-13 DIAGNOSIS — Z20822 Contact with and (suspected) exposure to covid-19: Secondary | ICD-10-CM | POA: Insufficient documentation

## 2021-10-13 DIAGNOSIS — F411 Generalized anxiety disorder: Secondary | ICD-10-CM

## 2021-10-13 DIAGNOSIS — A4189 Other specified sepsis: Secondary | ICD-10-CM | POA: Diagnosis not present

## 2021-10-13 DIAGNOSIS — Z8673 Personal history of transient ischemic attack (TIA), and cerebral infarction without residual deficits: Secondary | ICD-10-CM | POA: Insufficient documentation

## 2021-10-13 DIAGNOSIS — F331 Major depressive disorder, recurrent, moderate: Secondary | ICD-10-CM

## 2021-10-13 DIAGNOSIS — Z8547 Personal history of malignant neoplasm of testis: Secondary | ICD-10-CM | POA: Insufficient documentation

## 2021-10-13 DIAGNOSIS — G4733 Obstructive sleep apnea (adult) (pediatric): Secondary | ICD-10-CM | POA: Diagnosis present

## 2021-10-13 DIAGNOSIS — Z7901 Long term (current) use of anticoagulants: Secondary | ICD-10-CM | POA: Insufficient documentation

## 2021-10-13 DIAGNOSIS — N179 Acute kidney failure, unspecified: Secondary | ICD-10-CM | POA: Insufficient documentation

## 2021-10-13 DIAGNOSIS — I4891 Unspecified atrial fibrillation: Secondary | ICD-10-CM | POA: Diagnosis present

## 2021-10-13 DIAGNOSIS — F419 Anxiety disorder, unspecified: Secondary | ICD-10-CM | POA: Insufficient documentation

## 2021-10-13 DIAGNOSIS — F1020 Alcohol dependence, uncomplicated: Secondary | ICD-10-CM | POA: Diagnosis present

## 2021-10-13 DIAGNOSIS — G9341 Metabolic encephalopathy: Secondary | ICD-10-CM | POA: Diagnosis not present

## 2021-10-13 DIAGNOSIS — I130 Hypertensive heart and chronic kidney disease with heart failure and stage 1 through stage 4 chronic kidney disease, or unspecified chronic kidney disease: Secondary | ICD-10-CM | POA: Insufficient documentation

## 2021-10-13 DIAGNOSIS — I959 Hypotension, unspecified: Secondary | ICD-10-CM | POA: Insufficient documentation

## 2021-10-13 DIAGNOSIS — F321 Major depressive disorder, single episode, moderate: Secondary | ICD-10-CM | POA: Insufficient documentation

## 2021-10-13 DIAGNOSIS — Z79899 Other long term (current) drug therapy: Secondary | ICD-10-CM | POA: Insufficient documentation

## 2021-10-13 DIAGNOSIS — R55 Syncope and collapse: Secondary | ICD-10-CM

## 2021-10-13 DIAGNOSIS — I509 Heart failure, unspecified: Secondary | ICD-10-CM | POA: Insufficient documentation

## 2021-10-13 DIAGNOSIS — N1832 Chronic kidney disease, stage 3b: Secondary | ICD-10-CM | POA: Insufficient documentation

## 2021-10-13 LAB — URINALYSIS, ROUTINE W REFLEX MICROSCOPIC
Bilirubin Urine: NEGATIVE
Glucose, UA: NEGATIVE mg/dL
Ketones, ur: NEGATIVE mg/dL
Leukocytes,Ua: NEGATIVE
Nitrite: NEGATIVE
Protein, ur: NEGATIVE mg/dL
Specific Gravity, Urine: 1.009 (ref 1.005–1.030)
pH: 5 (ref 5.0–8.0)

## 2021-10-13 LAB — CBC WITH DIFFERENTIAL/PLATELET
Abs Immature Granulocytes: 0.03 10*3/uL (ref 0.00–0.07)
Basophils Absolute: 0 10*3/uL (ref 0.0–0.1)
Basophils Relative: 0 %
Eosinophils Absolute: 0 10*3/uL (ref 0.0–0.5)
Eosinophils Relative: 1 %
HCT: 43.7 % (ref 39.0–52.0)
Hemoglobin: 14.5 g/dL (ref 13.0–17.0)
Immature Granulocytes: 1 %
Lymphocytes Relative: 11 %
Lymphs Abs: 0.7 10*3/uL (ref 0.7–4.0)
MCH: 31.1 pg (ref 26.0–34.0)
MCHC: 33.2 g/dL (ref 30.0–36.0)
MCV: 93.8 fL (ref 80.0–100.0)
Monocytes Absolute: 0.7 10*3/uL (ref 0.1–1.0)
Monocytes Relative: 12 %
Neutro Abs: 4.4 10*3/uL (ref 1.7–7.7)
Neutrophils Relative %: 75 %
Platelets: 101 10*3/uL — ABNORMAL LOW (ref 150–400)
RBC: 4.66 MIL/uL (ref 4.22–5.81)
RDW: 14 % (ref 11.5–15.5)
WBC: 5.9 10*3/uL (ref 4.0–10.5)
nRBC: 0 % (ref 0.0–0.2)

## 2021-10-13 LAB — COMPREHENSIVE METABOLIC PANEL
ALT: 41 U/L (ref 0–44)
AST: 62 U/L — ABNORMAL HIGH (ref 15–41)
Albumin: 3.7 g/dL (ref 3.5–5.0)
Alkaline Phosphatase: 78 U/L (ref 38–126)
Anion gap: 13 (ref 5–15)
BUN: 47 mg/dL — ABNORMAL HIGH (ref 6–20)
CO2: 24 mmol/L (ref 22–32)
Calcium: 9.2 mg/dL (ref 8.9–10.3)
Chloride: 99 mmol/L (ref 98–111)
Creatinine, Ser: 4.17 mg/dL — ABNORMAL HIGH (ref 0.61–1.24)
GFR, Estimated: 16 mL/min — ABNORMAL LOW (ref >60)
Glucose, Bld: 138 mg/dL — ABNORMAL HIGH (ref 70–99)
Potassium: 3.8 mmol/L (ref 3.5–5.1)
Sodium: 136 mmol/L (ref 135–145)
Total Bilirubin: 1.2 mg/dL (ref 0.3–1.2)
Total Protein: 7.8 g/dL (ref 6.5–8.1)

## 2021-10-13 LAB — ETHANOL: Alcohol, Ethyl (B): <10 mg/dL (ref <10)

## 2021-10-13 LAB — MAGNESIUM: Magnesium: 1.9 mg/dL (ref 1.7–2.4)

## 2021-10-13 LAB — SARS CORONAVIRUS 2 BY RT PCR: SARS Coronavirus 2 by RT PCR: NEGATIVE

## 2021-10-13 LAB — CK: Total CK: 381 U/L (ref 49–397)

## 2021-10-13 LAB — TROPONIN I (HIGH SENSITIVITY)
Troponin I (High Sensitivity): 21 ng/L — ABNORMAL HIGH (ref <18)
Troponin I (High Sensitivity): 17 ng/L (ref <18)

## 2021-10-13 LAB — LACTIC ACID, PLASMA
Lactic Acid, Venous: 2.3 mmol/L (ref 0.5–1.9)
Lactic Acid, Venous: 1.1 mmol/L (ref 0.5–1.9)

## 2021-10-13 LAB — LIPASE, BLOOD: Lipase: 88 U/L — ABNORMAL HIGH (ref 11–51)

## 2021-10-13 MED ORDER — LORAZEPAM 1 MG PO TABS
1.0000 mg | ORAL_TABLET | ORAL | Status: DC | PRN
  Administered 2021-10-14: 1 mg via ORAL
  Filled 2021-10-13: qty 1

## 2021-10-13 MED ORDER — FLECAINIDE ACETATE 100 MG PO TABS
100.0000 mg | ORAL_TABLET | Freq: Two times a day (BID) | ORAL | Status: DC
  Administered 2021-10-13 – 2021-10-14 (×2): 100 mg via ORAL
  Filled 2021-10-13 (×2): qty 1

## 2021-10-13 MED ORDER — GADOBUTROL 1 MMOL/ML IV SOLN
10.0000 mL | Freq: Once | INTRAVENOUS | Status: AC | PRN
  Administered 2021-10-13: 10 mL via INTRAVENOUS

## 2021-10-13 MED ORDER — LORAZEPAM 2 MG/ML IJ SOLN: 1.0000 mg | INTRAMUSCULAR | Status: DC | PRN

## 2021-10-13 MED ORDER — ACETAMINOPHEN 650 MG RE SUPP: 650.0000 mg | Freq: Four times a day (QID) | RECTAL | Status: DC | PRN

## 2021-10-13 MED ORDER — SODIUM CHLORIDE 0.9% FLUSH: 3.0000 mL | Freq: Two times a day (BID) | INTRAVENOUS | Status: DC

## 2021-10-13 MED ORDER — SODIUM CHLORIDE 0.9 % IV SOLN: INTRAVENOUS | Status: DC

## 2021-10-13 MED ORDER — LORAZEPAM 0.5 MG PO TABS: 0.5000 mg | ORAL_TABLET | Freq: Three times a day (TID) | ORAL | Status: DC | PRN

## 2021-10-13 MED ORDER — LACTATED RINGERS IV BOLUS
1000.0000 mL | Freq: Once | INTRAVENOUS | Status: AC
  Administered 2021-10-13: 1000 mL via INTRAVENOUS

## 2021-10-13 MED ORDER — THIAMINE HCL 100 MG/ML IJ SOLN
100.0000 mg | Freq: Every day | INTRAMUSCULAR | Status: DC
Start: 2021-10-13 — End: 2021-10-14

## 2021-10-13 MED ORDER — LORAZEPAM 2 MG/ML IJ SOLN
2.0000 mg | INTRAMUSCULAR | Status: DC | PRN
Start: 2021-10-13 — End: 2021-10-13

## 2021-10-13 MED ORDER — PANTOPRAZOLE SODIUM 40 MG PO TBEC
40.0000 mg | DELAYED_RELEASE_TABLET | Freq: Every day | ORAL | Status: DC
Start: 2021-10-13 — End: 2021-10-14
  Administered 2021-10-13 – 2021-10-14 (×2): 40 mg via ORAL
  Filled 2021-10-13 (×2): qty 1

## 2021-10-13 MED ORDER — THIAMINE HCL 100 MG PO TABS
100.0000 mg | ORAL_TABLET | Freq: Every day | ORAL | Status: DC
Start: 2021-10-13 — End: 2021-10-14
  Administered 2021-10-13 – 2021-10-14 (×2): 100 mg via ORAL
  Filled 2021-10-13 (×2): qty 1

## 2021-10-13 MED ORDER — ONDANSETRON HCL 4 MG/2ML IJ SOLN: 4.0000 mg | Freq: Four times a day (QID) | INTRAMUSCULAR | Status: DC | PRN

## 2021-10-13 MED ORDER — APIXABAN 5 MG PO TABS
5.0000 mg | ORAL_TABLET | Freq: Two times a day (BID) | ORAL | Status: DC
  Administered 2021-10-13 – 2021-10-14 (×2): 5 mg via ORAL
  Filled 2021-10-13 (×2): qty 1

## 2021-10-13 MED ORDER — ESCITALOPRAM OXALATE 10 MG PO TABS: 10.0000 mg | ORAL_TABLET | Freq: Every day | ORAL | Status: DC

## 2021-10-13 MED ORDER — SENNOSIDES-DOCUSATE SODIUM 8.6-50 MG PO TABS: 1.0000 | ORAL_TABLET | Freq: Every evening | ORAL | Status: DC | PRN

## 2021-10-13 MED ORDER — ATORVASTATIN CALCIUM 20 MG PO TABS
40.0000 mg | ORAL_TABLET | Freq: Every day | ORAL | Status: DC
  Administered 2021-10-14: 40 mg via ORAL
  Filled 2021-10-13: qty 2

## 2021-10-13 MED ORDER — ACETAMINOPHEN 325 MG PO TABS
650.0000 mg | ORAL_TABLET | Freq: Four times a day (QID) | ORAL | Status: DC | PRN
  Administered 2021-10-14: 650 mg via ORAL
  Filled 2021-10-13: qty 2

## 2021-10-13 MED ORDER — ADULT MULTIVITAMIN W/MINERALS CH
1.0000 | ORAL_TABLET | Freq: Every day | ORAL | Status: DC
  Administered 2021-10-13 – 2021-10-14 (×2): 1 via ORAL
  Filled 2021-10-13 (×2): qty 1

## 2021-10-13 MED ORDER — ONDANSETRON HCL 4 MG PO TABS: 4.0000 mg | ORAL_TABLET | Freq: Four times a day (QID) | ORAL | Status: DC | PRN

## 2021-10-13 MED ORDER — METOPROLOL TARTRATE 5 MG/5ML IV SOLN: 5.0000 mg | INTRAVENOUS | Status: DC | PRN

## 2021-10-13 MED ORDER — SODIUM CHLORIDE 0.9 % IV BOLUS
1000.0000 mL | Freq: Once | INTRAVENOUS | Status: AC
  Administered 2021-10-13: 1000 mL via INTRAVENOUS

## 2021-10-13 MED ORDER — GABAPENTIN 100 MG PO CAPS
100.0000 mg | ORAL_CAPSULE | Freq: Every day | ORAL | Status: DC
  Administered 2021-10-13: 100 mg via ORAL
  Filled 2021-10-13: qty 1

## 2021-10-13 MED ORDER — FOLIC ACID 1 MG PO TABS
1.0000 mg | ORAL_TABLET | Freq: Every day | ORAL | Status: DC
Start: 2021-10-13 — End: 2021-10-14
  Administered 2021-10-13 – 2021-10-14 (×2): 1 mg via ORAL
  Filled 2021-10-13 (×2): qty 1

## 2021-10-13 NOTE — Assessment & Plan Note (Signed)
-   Resumed home flecainide and apixaban 5 mg p.o. twice daily

## 2021-10-13 NOTE — ED Triage Notes (Signed)
First Nurse Note:  Pt via EMS from [facility]. Pt c/o syncopal episode. Pt was working states he had a pain to the back of his neck and back. Then states he started to feel lightheadedness and felt like he was about to pass out. Pt states he started a new heart medication. Pt is A&Ox4 and NAD.   20G L hand LBBB per EMS  95/57 initial, EMS gave NS bolus  96/67  70 HR  95% on RA

## 2021-10-13 NOTE — H&P (Addendum)
History and Physical   Fabricio Wells ZOX:[id number] DOB: Mar 16, 1966 DOA: 10/13/2021  PCP: Leonel Ramsay, MD  Outpatient Specialists: Dr. Debbe Mounts, [facility] Patient coming from: [facility] via EMS  I have personally briefly reviewed patient's old medical records in [place].  Chief Concern: Syncope  HPI: Mr. Dustin Wells is a 56 year old male with history of hyperlipidemia, hypertension, alcohol use disorder, obesity, OSA, who presents emergency department from work for chief concerns of syncope.  Initial vitals in the emergency department show temperature of 97.9, respiration rate of 16, heart rate of 70, initial blood pressure 70/39, improved to 106/72, SPO2 100% on room air.  Serum sodium is 136, potassium 3.8, chloride 99, bicarb 24, BUN of 47, serum creatinine of 4.17, GFR 16, nonfasting blood glucose 138, WBC 5.9, hemoglobin 14.5, platelets of 101.  Lactic acid was initially elevated at 2.3 and improved to 1.1.  High sensitive troponin was 21 and improved to 17.  COVID PCR was negative.  UA was negative for leukocytes and nitrates.  ED treatment: LR 2 L bolus, sodium chloride 1 L bolus.  At bedside, he is able to tell me his name, age, current year and current location.   He reports he did not loose consciousness at work. He states he felt weak and his legs gave out on him and he slid downwards hitting the back of his head lightly.  He reports that on Friday, he felt right sided flank pain that has since resolved. He reprots that on Saturday, he had urinary incontinence and couldn't make it to the bathroom and dribbled on himself and the floor on his way to the toilet. He also had an accident at work.  He denies chest pain, shortness of breath, abdominal pain, diarrhea.  He endorses   He is currently not wearing his cpap mask. He has been referred to pulmonologist/sleep specialisit for a sleep study as the last study was 10 years ago.   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  He reports he has not been out in the sun while at work.  He reports that he last had water a.m. on day of presentation enough to just wash down his medications.  He reports that he had approximately 20 to 24 ounces of water overnight and infrequently drinks water during the day.  He reports that he just drinks enough water during the day to take his medications.-  Social history: He lives at home with his wife. Patient drinks EtOH daily.  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  ROS: Constitutional: no weight change, no fever ENT/Mouth: no sore throat, no rhinorrhea Eyes: no eye pain, no vision changes Cardiovascular: no chest pain, no dyspnea,  no edema, no palpitations Respiratory: no cough, no sputum, no wheezing Gastrointestinal: no nausea, no vomiting, no diarrhea, no constipation Genitourinary: no urinary incontinence, + dysuria, no hematuria Musculoskeletal: no arthralgias, no myalgias Skin: no skin lesions, no pruritus, Neuro: + weakness, no loss of consciousness, no syncope Psych: no anxiety, no depression, no decrease appetite Heme/Lymph: no bruising, no bleeding  ED Course: Discussed with emergency medicine provider, patient requiring hospitalization for chief concerns of syncope.  Assessment/Plan  Principal Problem:   Syncope Active Problems:   Alcohol use disorder, severe, dependence (HCC)   MDD (major depressive disorder), recurrent episode, moderate (HCC)   GAD (generalized anxiety disorder)   Morbid obesity (HCC)   OSA (obstructive sleep apnea)  AKI (acute kidney injury) ([place])   Stage 3b chronic kidney disease (CKD) (HCC)   A-fib (HCC)   Assessment and Plan:  * Syncope - Etiology work-up in progress, query hypovolemia with hypotension and patient response to aggressive IV fluid - Status post lactated ringer 2 L bolus and sodium chloride 1 L bolus  per EDP - MRI of the brain without contrast has been ordered however patient received contrast, therefore MRI of the brain without contrast has been deferred for 24 hrs - Sodium chloride 150 mL/h, 1 day ordered - We will check TSH, complete echo - Neurovascular checks every 4 hours - Admit to telemetry cardiac, observation  A-fib ([place]) - Resumed home flecainide and apixaban 5 mg p.o. twice daily  Stage 3b chronic kidney disease (CKD) ([place]) - Baseline serum creatinine is 2.1-2.2/ eGFR 31-33, over the last 6 months - BMP in the a.m.  AKI (acute kidney injury) ([place]) - On CKD 3B - Check stat/urgent CK level  OSA (obstructive sleep apnea) - CPAP mask qhs ordered - Continue outpatient follow-up with sleep specialist for outpatient sleep study  Morbid obesity ([place]) - This meets criteria for morbid obesity based on the presence of 1 or more chronic comorbidities. Patient has hypertension. This complicates overall care and prognosis.   GAD (generalized anxiety disorder) - Resumed home Ativan 0.5 mg p.o. every 8 hours as needed for anxiety - Ativan 2 mg IV as needed for anxiety, seizure, 2 doses ordered  MDD (major depressive disorder), recurrent episode, moderate ([place]) - Patient reports that he is no longer taking home escitalopram  Alcohol use disorder, severe, dependence ([place]) - CIWA precaution ordered - Patient is unlikely to be within the window for withdrawal - Patient's last drink was evening of 10/12/2021  Chart reviewed.   DVT prophylaxis: Apixaban Code Status: Full code Diet: Heart healthy Family Communication: A phone call was offered, patient declined stating his wife already knows he is here Disposition Plan: Pending clinical course Consults called: None at this time Admission status: Telemetry cardiac, observation  Past Medical History:  Diagnosis Date   Atrial fibrillation ([place])    CHF (congestive heart failure) ([place])    Hypertension    Renal disorder     Stroke [facility])    Testicular cancer [facility])    Past Surgical History:  Procedure Laterality Date   cardiac albation     CARDIAC ELECTROPHYSIOLOGY MAPPING AND ABLATION     Social History:  reports that he has never smoked. He has never used smokeless tobacco. He reports current alcohol use of about 21.0 standard drinks of alcohol per week. He reports that he does not currently use drugs.  Allergies  Allergen Reactions   Rivaroxaban Diarrhea, Nausea Only and Other (See Comments)    Nausea, diarrhea, abdominal pain Nausea, diarrhea, abdominal pain Nausea, diarrhea, abdominal pain    Sulfamethoxazole-Trimethoprim Rash   History reviewed. No pertinent family history. Family history: Family history reviewed and not pertinent  Prior to Admission medications   Medication Sig Start Date End Date Taking? Authorizing Provider  acetaminophen (TYLENOL) 325 MG tablet Take by mouth.    [provider]  allopurinol (ZYLOPRIM) 300 MG tablet Take 300 mg by mouth daily.    [provider]  amLODipine (NORVASC) 5 MG tablet Take by mouth. 08/04/20   [provider]  apixaban (ELIQUIS) 5 MG TABS tablet 1 tab(s) 03/27/19   [provider]  atorvastatin (LIPITOR) 40 MG tablet Take by mouth. 02/05/19   [provider]  chlordiazePOXIDE (LIBRIUM) 25 MG capsule Day 1: take 50 mg (2 capsules) in AM, afternoon, and evening.  Day 2: take 50 mg (2 capsules) AM and PM.  Day 3: take 50 mg (2 capsules) in AM 07/10/19   Lilia Pro., MD  Cholecalciferol 25 MCG (1000 UT) tablet Take by mouth.    [provider]  clomiPHENE (CLOMID) 50 MG tablet Take 1/2 tablet daily 09/29/21   Hollice Espy, MD  cyanocobalamin 1000 MCG tablet Take by mouth.    [provider]  diclofenac Sodium (VOLTAREN) 1 % GEL Apply topically.    [provider]  doxepin (SINEQUAN) 50 MG capsule TAKE 1 CAPSULE BY MOUTH EVERY DAY NIGHTLY 02/27/19   [provider]   ergocalciferol (VITAMIN D2) 1.25 MG (50000 UT) capsule Take by mouth. 07/24/20   [provider]  escitalopram (LEXAPRO) 10 MG tablet Take 10 mg by mouth daily.    [provider]  flecainide (TAMBOCOR) 100 MG tablet Take 1 tablet by mouth every 12 (twelve) hours. 02/27/19   [provider]  folic acid (FOLVITE) 1 MG tablet Take by mouth. 03/12/19   [provider]  gabapentin (NEURONTIN) 300 MG capsule Take 1-3 tabs at night 01/05/21   [provider]  LORazepam (ATIVAN) 0.5 MG tablet Take 0.5 mg by mouth every 8 (eight) hours.    [provider]  losartan (COZAAR) 100 MG tablet Take by mouth. 03/25/19   [provider]  magnesium oxide (MAG-OX) 400 MG tablet Take 1 tablet by mouth 2 (two) times daily. 04/30/19   [provider]  metoprolol succinate (TOPROL-XL) 100 MG 24 hr tablet Take by mouth. 04/30/19   [provider]  pantoprazole (PROTONIX) 40 MG tablet Take 1 tablet by mouth daily. 11/17/20   [provider]  sildenafil (REVATIO) 20 MG tablet Take 3-5 tablets 1 hr prior to intercourse as needed 09/29/21   Hollice Espy, MD  sodium bicarbonate 650 MG tablet 1 tablet by mouth 2 times a day 05/15/19   [provider]  torsemide (DEMADEX) 10 MG tablet Take by mouth. 12/24/19   [provider]   Physical Exam: Vitals:   10/13/21 1100 10/13/21 1115 10/13/21 1613 10/13/21 1909  BP: (!) 70/39 (!) 78/50 106/72   Pulse: 70 71    Resp: 16 20    Temp:    98 F (36.7 C)  TempSrc:    Oral  SpO2: 100% 96%    Weight:      Height:       Constitutional: appears older than chronological age, NAD, calm, comfortable Eyes: PERRL, lids and conjunctivae normal ENMT: Mucous membranes are moist. Posterior pharynx clear of any exudate or lesions. Age-appropriate dentition. Hearing appropriate Neck: normal, supple, no masses, no thyromegaly Respiratory: clear to auscultation bilaterally, no wheezing, no  crackles. Normal respiratory effort. No accessory muscle use.  Cardiovascular: Regular rate and rhythm, no murmurs / rubs / gallops. No extremity edema. 2+ pedal pulses. No carotid bruits.  Abdomen: Morbidly obese abdomen, no tenderness, no masses palpated, no hepatosplenomegaly. Bowel sounds positive.  Musculoskeletal: no clubbing / cyanosis. No joint deformity upper and lower extremities. Good ROM, no contractures, no atrophy. Normal muscle tone.  Skin: no rashes, lesions, ulcers. No induration Neurologic: Sensation intact. Strength 5/5 in all 4.  Psychiatric: Normal judgment and insight. Alert and oriented x 3. Normal mood.   EKG: independently reviewed, showing sinus rhythm with rate of 72, QTc 567  Chest x-ray  on Admission: I personally reviewed and I agree with radiologist reading as below.  MR ANGIO CHEST W WO CONTRAST  Result Date: 10/13/2021 CLINICAL DATA:  Syncope, chest pain.  Evaluate for dissection EXAM: MRA CHEST WITH OR WITHOUT CONTRAST TECHNIQUE: Angiographic images of the chest were obtained using MRA technique without and with intravenous contrast. CONTRAST:  36m GADAVIST GADOBUTROL 1 MMOL/ML IV SOLN COMPARISON:  None Available. FINDINGS: VASCULAR Aorta: Conventional 3 vessel arch anatomy. No evidence dissection aneurysm. Heart: Normal in size.  No pericardial effusion. Pulmonary Arteries: Normal in size. No signal abnormality to suggest pulmonary embolism. Other: None. NON-VASCULAR No focal signal abnormality or abnormal enhancement within the lungs, mediastinum, visualized upper abdomen or musculoskeletal soft tissues. IMPRESSION: 1. No evidence of aortic dissection, aneurysm or other acute arterial abnormality. 2. No acute cardiopulmonary process. Electronically Signed   By: HJacqulynn CadetM.D.   On: 10/13/2021 12:57   CT HEAD WO CONTRAST (5MM)  Result Date: 10/13/2021 CLINICAL DATA:  Woke up with weakness and dizziness and fell. EXAM: CT HEAD WITHOUT CONTRAST TECHNIQUE:  Contiguous axial images were obtained from the base of the skull through the vertex without intravenous contrast. RADIATION DOSE REDUCTION: This exam was performed according to the departmental dose-optimization program which includes automated exposure control, adjustment of the mA and/or kV according to patient size and/or use of iterative reconstruction technique. 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 No acute intracranial findings such as hemispheric infarction or intracranial hemorrhage. Stable patchy periventricular white matter disease. No extra-axial fluid collections are identified. No mass lesions. The brainstem and cerebellum are grossly normal. Vascular: Scattered vascular calcifications. No aneurysm or hyperdense vessels. Skull: No skull fracture or bone lesions. Sinuses/Orbits: The paranasal sinuses and mastoid air cells are clear. The globes are intact. Other: No scalp lesions or scalp hematoma. IMPRESSION: 1. 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 2. No acute intracranial findings or mass lesions. Electronically Signed   By: PMarijo SanesM.D.   On: 10/13/2021 11:13   CT Cervical Spine Wo Contrast  Result Date: 10/13/2021 CLINICAL DATA:  Neck trauma. Dangers injury mechanism. Weakness and dizziness. Trauma to the back of the head. EXAM: CT CERVICAL SPINE WITHOUT CONTRAST TECHNIQUE: Multidetector CT imaging of the cervical spine was performed without intravenous contrast. Multiplanar CT image reconstructions were also generated. RADIATION DOSE REDUCTION: This exam was performed according to the departmental dose-optimization program which includes automated exposure control, adjustment of the mA and/or kV according to patient size and/or use of iterative reconstruction technique. COMPARISON:  None FINDINGS:  Alignment: Normal Skull base and vertebrae: No fracture or focal bone lesion. Soft tissues and spinal canal: No traumatic soft tissue finding. Disc levels: Ordinary spondylosis at C6-7 with small endplate osteophytes but no bony stenosis of the canal or foramina. Facet osteoarthritis on the left at C7-T1. Upper chest: Negative Other: None IMPRESSION: No acute or traumatic finding.  Mild cervical degenerative changes. Electronically Signed   By: MNelson ChimesM.D.   On: 10/13/2021 11:03   DG Chest Portable 1 View  Result Date: 10/13/2021 CLINICAL DATA:  Syncopal episode. EXAM: PORTABLE CHEST 1 VIEW COMPARISON:  Radiographs 08/26/2017. FINDINGS: 1038 hours. The heart size appears stable at the upper limits of normal for portable AP technique. Probable mild atelectasis or scarring at both lung bases. No confluent airspace opacity, edema, pneumothorax  or significant pleural effusion. No acute osseous findings are evident. Telemetry leads overlie the chest. IMPRESSION: No evidence of active cardiopulmonary process. Electronically Signed   By: Richardean Sale M.D.   On: 10/13/2021 10:47    Labs on Admission: I have personally reviewed following labs  CBC: Recent Labs  Lab 10/13/21 1026  WBC 5.9  NEUTROABS 4.4  HGB 14.5  HCT 43.7  MCV 93.8  PLT 629*   Basic Metabolic Panel: Recent Labs  Lab 10/13/21 1026  NA 136  K 3.8  CL 99  CO2 24  GLUCOSE 138*  BUN 47*  CREATININE 4.17*  CALCIUM 9.2  MG 1.9   GFR: Estimated Creatinine Clearance: 27.4 mL/min (A) (by C-G formula based on SCr of 4.17 mg/dL (H)).  Liver Function Tests: Recent Labs  Lab 10/13/21 1026  AST 62*  ALT 41  ALKPHOS 78  BILITOT 1.2  PROT 7.8  ALBUMIN 3.7   Recent Labs  Lab 10/13/21 1026  LIPASE 88*   Cardiac Enzymes: Recent Labs  Lab 10/13/21 1026  CKTOTAL 381   Urine analysis:    Component Value Date/Time   COLORURINE YELLOW (A) 10/13/2021 1629   APPEARANCEUR HAZY (A) 10/13/2021 1629   APPEARANCEUR  Clear 09/29/2021 0912   LABSPEC 1.009 10/13/2021 1629   PHURINE 5.0 10/13/2021 1629   GLUCOSEU NEGATIVE 10/13/2021 1629   HGBUR SMALL (A) 10/13/2021 1629   BILIRUBINUR NEGATIVE 10/13/2021 1629   BILIRUBINUR Negative 09/29/2021 0912   KETONESUR NEGATIVE 10/13/2021 1629   PROTEINUR NEGATIVE 10/13/2021 1629   NITRITE NEGATIVE 10/13/2021 1629   LEUKOCYTESUR NEGATIVE 10/13/2021 1629   Dr. Tobie Poet [facility]  If 7PM-7AM, please contact overnight-coverage provider If 7AM-7PM, please contact day coverage provider [email or web address]  10/13/2021, 8:06 PM

## 2021-10-13 NOTE — Assessment & Plan Note (Addendum)
-   Resumed home Ativan 0.5 mg p.o. every 8 hours as needed for anxiety - Ativan 2 mg IV as needed for anxiety, seizure, 2 doses ordered

## 2021-10-13 NOTE — Assessment & Plan Note (Addendum)
-   CPAP mask qhs ordered - Continue outpatient follow-up with sleep specialist for outpatient sleep study

## 2021-10-13 NOTE — Assessment & Plan Note (Signed)
-   On CKD 3B - Check stat/urgent CK level

## 2021-10-13 NOTE — Assessment & Plan Note (Addendum)
-   CIWA precaution ordered - Patient is unlikely to be within the window for withdrawal - Patient's last drink was evening of 10/12/2021

## 2021-10-13 NOTE — ED Provider Notes (Signed)
[facility] Provider Note    Event Date/Time   First MD Initiated Contact with Patient 10/13/21 1011     (approximate)   History   Back Pain   HPI  Dustin Wells is a 56 y.o. male with atrial fibrillation status post multiple cardioversions previously on amiodarone status post catheter ablation in 2019 on flecainide, metoprolol and Eliquis, CKD who comes in with back pain.  Patient reportedly had a syncopal episode when at [employer].  Patient was working stating he had a lot of pain in the back of his neck and back and he felt lightheaded like he was going to pass out.  Patient was hypotensive and given fluids with EMS.  Patient reports that he was started on a new medication for concern for low testosterone called clomid but is not on any testosterone pills.  He reports that after starting that he did feel little bit off the past few days.  Patient does think he hit his head from the fall.  He is on a blood thinner and reports being compliant with it.  He reports he did not have a headache before the fall it was just that he was having pain in his neck that went down to the mid back.  Physical Exam   Triage Vital Signs: ED Triage Vitals  Enc Vitals Group     BP 10/13/21 1001 (!) 66/38     Pulse Rate 10/13/21 1001 71     Resp 10/13/21 1001 18     Temp 10/13/21 1001 97.9 F (36.6 C)     Temp Source 10/13/21 1001 Oral     SpO2 10/13/21 1001 96 %     Weight 10/13/21 1008 265 lb (120.2 kg)     Height 10/13/21 1008 6' (1.829 m)     Head Circumference --      Peak Flow --      Pain Score 10/13/21 1008 8     Pain Loc --      Pain Edu? --      Excl. in [place]? --     Most recent vital signs: Vitals:   10/13/21 1001  BP: (!) 66/38  Pulse: 71  Resp: 18  Temp: 97.9 F (36.6 C)  SpO2: 96%     General: Awake, no distress.  CV:  Good peripheral perfusion.  Resp:  Normal effort.  Abd:  No distention.  Other:  Upper back pain from the cervical area down to the  mid thoracic area.  Good distal pulses although patient does feel little cold and clammy.   ED Results / Procedures / Treatments   Labs (all labs ordered are listed, but only abnormal results are displayed) Labs Reviewed  CBC WITH DIFFERENTIAL/PLATELET - Abnormal; Notable for the following components:      Result Value   Platelets 101 (*)    All other components within normal limits  COMPREHENSIVE METABOLIC PANEL - Abnormal; Notable for the following components:   Glucose, Bld 138 (*)    BUN 47 (*)    Creatinine, Ser 4.17 (*)    AST 62 (*)    GFR, Estimated 16 (*)    All other components within normal limits  LIPASE, BLOOD - Abnormal; Notable for the following components:   Lipase 88 (*)    All other components within normal limits  TROPONIN I (HIGH SENSITIVITY) - Abnormal; Notable for the following components:   Troponin I (High Sensitivity) 21 (*)    All other  components within normal limits  MAGNESIUM  ETHANOL     EKG  My interpretation of EKG:  Normal sinus rate of 77 without any ST elevation or T wave inversions, normal intervals  RADIOLOGY I have reviewed the CT head personally interpreted and there is an old infarct   PROCEDURES:  Critical Care performed: No  .1-3 Lead EKG Interpretation  Performed by: Vanessa Byars, MD Authorized by: Vanessa [place], MD     Interpretation: normal     ECG rate:  70   ECG rate assessment: normal     Rhythm: sinus rhythm     Ectopy: none     Conduction: normal      MEDICATIONS ORDERED IN ED: Medications  sodium chloride 0.9 % bolus 1,000 mL (1,000 mLs Intravenous New Bag/Given 10/13/21 1230)  gadobutrol (GADAVIST) 1 MMOL/ML injection 10 mL (10 mLs Intravenous Contrast Given 10/13/21 1222)  lactated ringers bolus 1,000 mL (0 mLs Intravenous Stopped 10/13/21 1239)     IMPRESSION / MDM / ASSESSMENT AND PLAN / ED COURSE  I reviewed the triage vital signs and the nursing notes.   Patient's presentation is most  consistent with acute presentation with potential threat to life or bodily function.   Differential includes hypotension from dehydration, intracranial hemorrhage, cervical fracture and concern about possible dissection patient has known kidney disease so we will hold off on CTA to get his kidney function back and we will start off with fluid hydration.  CBC shows stable hemoglobin.  CMP shows creatinine up to 4.17.  Troponin is slightly elevated mag normal EtOH negative.  Patient does reportedly stop drinking alcohol a few days ago and has not been eating and drinking as much this could just all be from dehydration but given the continued upper back pain I am also concerned about possible dissection  CT head and neck are negative.  Reevaluated patient he continues to have pain in his upper back down,.  His abdomen is soft and nontender I have lower suspicion for abdominal aneurysm.  If he has any type of dissection it would be in the upper part.  I discussed with MRI negative and MRI angio of the chest and if they notice an abnormality extend down into the abdomen.  I discussed with patient the concerns of doing the CTA with with his kidney function versus doing the MRI angio of the chest and they would like to proceed with the MRI angio of the chest.  We will hang another liter of fluid and patient goes down he is mentating well over the past 2 hours.  MRI is reassuring  Patient's postvoid bladder scan is normal  Patient still hypotensive on his third liter of fluid.  Discussed the ICU team wanted me to add on a lactate and COVID and they will come evaluate patient.  Patient is mentating well and otherwise looks well.  I do not want him to start pressors unless necessary.  I do not feel that he is septic he has no fever no other infectious symptoms so holding off on antibiotics at this point.  I suspect the hypotension could be related to his medications with his worsening AKI.  Patient off to  oncoming team pending the rest of results and discussion with ICU   The patient is on the cardiac monitor to evaluate for evidence of arrhythmia and/or significant heart rate changes.     FINAL CLINICAL IMPRESSION(S) / ED DIAGNOSES   Final diagnoses:  AKI (acute kidney  injury) ([place])  Hypotension, unspecified hypotension type     Rx / DC Orders   ED Discharge Orders     None        Note:  This document was prepared using Dragon voice recognition software and may include unintentional dictation errors.   Vanessa [place], MD 10/13/21 [phone]

## 2021-10-13 NOTE — Hospital Course (Signed)
Mr. Morley Gaumer is a 56 year old male with history of hyperlipidemia, hypertension, alcohol use disorder, obesity, OSA, who presents emergency department from work for chief concerns of syncope.  Initial vitals in the emergency department show temperature of 97.9, respiration rate of 16, heart rate of 70, initial blood pressure 70/39, improved to 106/72, SPO2 100% on room air.  Serum sodium is 136, potassium 3.8, chloride 99, bicarb 24, BUN of 47, serum creatinine of 4.17, GFR 16, nonfasting blood glucose 138, WBC 5.9, hemoglobin 14.5, platelets of 101.  Lactic acid was initially elevated at 2.3 and improved to 1.1.  High sensitive troponin was 21 and improved to 17.  COVID PCR was negative.  UA was negative for leukocytes and nitrates.  ED treatment: LR 2 L bolus, sodium chloride 1 L bolus.  All imaging which included CT head and MRI brain was negative.  CTA was negative for any acute abnormality or PE. Patient was also found to have AKI with history of CKD stage IIIb, renal functions with some improvement with IV fluid. Blood pressure responded well to fluid resuscitation. Most likely hypovolemic and he continues to take losartan and torsemide. We held his home losartan and torsemide until he sees his nephrologist and they can restart once renal function at baseline.  Echocardiogram was normal.  Patient is being discharged as he wants to go home and he will follow-up with his doctors.  Patient will continue with rest of his home medications and instructed to keep himself well-hydrated.  Patient need to have a repeat renal function in next 2 to 3 days. Patient need to follow-up with his providers for further management.

## 2021-10-13 NOTE — ED Notes (Signed)
Pt ate a dinner plate.

## 2021-10-13 NOTE — ED Notes (Signed)
Bladder scan shows 67m.

## 2021-10-13 NOTE — ED Triage Notes (Signed)
Pt  woke up with co weakness and dizziness. He stood up prior to coming in and fell due to weakness, no loc. States did hit back of head. Co neck pain that radiates to to back.

## 2021-10-13 NOTE — Assessment & Plan Note (Signed)
-   This meets criteria for morbid obesity based on the presence of 1 or more chronic comorbidities. Patient has hypertension. This complicates overall care and prognosis.  

## 2021-10-13 NOTE — Assessment & Plan Note (Addendum)
-  Baseline serum creatinine is 2.1-2.2/ eGFR 31-33, over the last 6 months - BMP in the a.m.

## 2021-10-13 NOTE — Assessment & Plan Note (Signed)
-   Patient reports that he is no longer taking home escitalopram

## 2021-10-13 NOTE — Assessment & Plan Note (Addendum)
-   Etiology work-up in progress, query hypovolemia with hypotension and patient response to aggressive IV fluid - Status post lactated ringer 2 L bolus and sodium chloride 1 L bolus per EDP - MRI of the brain without contrast has been ordered however patient received contrast, therefore MRI of the brain without contrast has been deferred for 24 hrs - Sodium chloride 150 mL/h, 1 day ordered - We will check TSH, complete echo - Neurovascular checks every 4 hours - Admit to telemetry cardiac, observation

## 2021-10-14 ENCOUNTER — Observation Stay: Payer: 59

## 2021-10-14 ENCOUNTER — Observation Stay (HOSPITAL_BASED_OUTPATIENT_CLINIC_OR_DEPARTMENT_OTHER)
Admit: 2021-10-14 | Discharge: 2021-10-14 | Disposition: A | Discharge status: Home / Self Care | Payer: 59 | Attending: Internal Medicine | Admitting: Internal Medicine

## 2021-10-14 DIAGNOSIS — R55 Syncope and collapse: Secondary | ICD-10-CM | POA: Diagnosis not present

## 2021-10-14 LAB — BASIC METABOLIC PANEL
Anion gap: 10 (ref 5–15)
BUN: 45 mg/dL — ABNORMAL HIGH (ref 6–20)
CO2: 22 mmol/L (ref 22–32)
Calcium: 9.2 mg/dL (ref 8.9–10.3)
Chloride: 105 mmol/L (ref 98–111)
Creatinine, Ser: 3.19 mg/dL — ABNORMAL HIGH (ref 0.61–1.24)
GFR, Estimated: 22 mL/min — ABNORMAL LOW (ref >60)
Glucose, Bld: 105 mg/dL — ABNORMAL HIGH (ref 70–99)
Potassium: 3.8 mmol/L (ref 3.5–5.1)
Sodium: 137 mmol/L (ref 135–145)

## 2021-10-14 LAB — ECHOCARDIOGRAM COMPLETE
AR max vel: 2.2 cm2
AV Area VTI: 2.23 cm2
AV Area mean vel: 2.19 cm2
AV Mean grad: 6 mmHg
AV Peak grad: 9.5 mmHg
Ao pk vel: 1.54 m/s
Area-P 1/2: 4.15 cm2
Height: 72 in
S' Lateral: 3.3 cm
Weight: 4240 oz

## 2021-10-14 LAB — CBC
HCT: 37.4 % — ABNORMAL LOW (ref 39.0–52.0)
Hemoglobin: 12.7 g/dL — ABNORMAL LOW (ref 13.0–17.0)
MCH: 31.3 pg (ref 26.0–34.0)
MCHC: 34 g/dL (ref 30.0–36.0)
MCV: 92.1 fL (ref 80.0–100.0)
Platelets: 91 10*3/uL — ABNORMAL LOW (ref 150–400)
RBC: 4.06 MIL/uL — ABNORMAL LOW (ref 4.22–5.81)
RDW: 14 % (ref 11.5–15.5)
WBC: 4.4 10*3/uL (ref 4.0–10.5)
nRBC: 0 % (ref 0.0–0.2)

## 2021-10-14 LAB — TSH: TSH: 1.752 u[IU]/mL (ref 0.350–4.500)

## 2021-10-14 LAB — HIV ANTIBODY (ROUTINE TESTING W REFLEX): HIV Screen 4th Generation wRfx: NONREACTIVE

## 2021-10-14 LAB — CK: Total CK: 303 U/L (ref 49–397)

## 2021-10-14 MED ORDER — TORSEMIDE 10 MG PO TABS: 10.0000 mg | ORAL_TABLET | Freq: Every day | ORAL | Status: DC

## 2021-10-14 MED ORDER — LOSARTAN POTASSIUM 100 MG PO TABS: 100.0000 mg | ORAL_TABLET | Freq: Every day | ORAL | Status: DC

## 2021-10-14 NOTE — Progress Notes (Signed)
IV removed from patient. Discharge instructions given to patient. Verbalized understanding. No acute distress at this time. Wife to transport patient home.

## 2021-10-14 NOTE — Progress Notes (Signed)
Lab states they will be here after inpatient floors to collect CPOD labs

## 2021-10-14 NOTE — Progress Notes (Signed)
*  PRELIMINARY RESULTS* Echocardiogram 2D Echocardiogram has been performed.  Dustin Wells 10/14/2021, 1:43 PM

## 2021-10-14 NOTE — Progress Notes (Signed)
Admission profile updated. ?

## 2021-10-14 NOTE — Discharge Summary (Signed)
Physician Discharge Summary   Patient: Dustin Wells MRN: [id number] DOB: 1966/03/13  Admit date:     10/13/2021  Discharge date: 10/14/21  Discharge Physician: Lorella Nimrod   PCP: Leonel Ramsay, MD   Recommendations at discharge:  Please obtain renal function in the next 2 to 3 days Restart losartan and torsemide if renal function at baseline and clinically appropriate. Follow-up with nephrology within the next few days Follow-up with PCP within a week  Discharge Diagnoses: Principal Problem:   Syncope Active Problems:   Alcohol use disorder, severe, dependence (HCC)   MDD (major depressive disorder), recurrent episode, moderate (HCC)   GAD (generalized anxiety disorder)   Morbid obesity ([place])   OSA (obstructive sleep apnea)   AKI (acute kidney injury) ([place])   Stage 3b chronic kidney disease (CKD) ([place])   A-fib [facility])   Hospital Course: Mr. Dustin Wells is a 56 year old male with history of hyperlipidemia, hypertension, alcohol use disorder, obesity, OSA, who presents emergency department from work for chief concerns of syncope.  Initial vitals in the emergency department show temperature of 97.9, respiration rate of 16, heart rate of 70, initial blood pressure 70/39, improved to 106/72, SPO2 100% on room air.  Serum sodium is 136, potassium 3.8, chloride 99, bicarb 24, BUN of 47, serum creatinine of 4.17, GFR 16, nonfasting blood glucose 138, WBC 5.9, hemoglobin 14.5, platelets of 101.  Lactic acid was initially elevated at 2.3 and improved to 1.1.  High sensitive troponin was 21 and improved to 17.  COVID PCR was negative.  UA was negative for leukocytes and nitrates.  ED treatment: LR 2 L bolus, sodium chloride 1 L bolus.  All imaging which included CT head and MRI brain was negative.  CTA was negative for any acute abnormality or PE. Patient was also found to have AKI with history of CKD stage IIIb, renal functions with some improvement with IV fluid. Blood pressure  responded well to fluid resuscitation. Most likely hypovolemic and he continues to take losartan and torsemide. We held his home losartan and torsemide until he sees his nephrologist and they can restart once renal function at baseline.  Echocardiogram was normal.  Patient is being discharged as he wants to go home and he will follow-up with his doctors.  Patient will continue with rest of his home medications and instructed to keep himself well-hydrated.  Patient need to have a repeat renal function in next 2 to 3 days. Patient need to follow-up with his providers for further management.  Assessment and Plan: * Syncope - Etiology work-up in progress, query hypovolemia with hypotension and patient response to aggressive IV fluid - Status post lactated ringer 2 L bolus and sodium chloride 1 L bolus per EDP - MRI of the brain without contrast has been ordered however patient received contrast, therefore MRI of the brain without contrast has been deferred for 24 hrs - Sodium chloride 150 mL/h, 1 day ordered - We will check TSH, complete echo - Neurovascular checks every 4 hours - Admit to telemetry cardiac, observation  A-fib ([place]) - Resumed home flecainide and apixaban 5 mg p.o. twice daily  Stage 3b chronic kidney disease (CKD) ([place]) - Baseline serum creatinine is 2.1-2.2/ eGFR 31-33, over the last 6 months - BMP in the a.m.  AKI (acute kidney injury) ([place]) - On CKD 3B - Check stat/urgent CK level  OSA (obstructive sleep apnea) - CPAP mask qhs ordered - Continue outpatient follow-up with sleep specialist for outpatient sleep study  Morbid obesity ([place]) - This meets criteria for morbid obesity based on the presence of 1 or more chronic comorbidities. Patient has hypertension. This complicates overall care and prognosis.   GAD (generalized anxiety disorder) - Resumed home Ativan 0.5 mg p.o. every 8 hours as needed for anxiety - Ativan 2 mg IV as needed for anxiety, seizure, 2  doses ordered  MDD (major depressive disorder), recurrent episode, moderate ([place]) - Patient reports that he is no longer taking home escitalopram  Alcohol use disorder, severe, dependence ([place]) - CIWA precaution ordered - Patient is unlikely to be within the window for withdrawal - Patient's last drink was evening of 10/12/2021   Consultants: None Procedures performed: None Disposition: Home Diet recommendation:  Discharge Diet Orders (From admission, onward)     Start     Ordered   10/14/21 0000  Diet - low sodium heart healthy        10/14/21 1429           Cardiac and Carb modified diet DISCHARGE MEDICATION: Allergies as of 10/14/2021       Reactions   Rivaroxaban Diarrhea, Nausea Only, Other (See Comments)   Nausea, diarrhea, abdominal pain Nausea, diarrhea, abdominal pain Nausea, diarrhea, abdominal pain   Sulfamethoxazole-trimethoprim Rash        Medication List     STOP taking these medications    chlordiazePOXIDE 25 MG capsule Commonly known as: LIBRIUM   doxepin 50 MG capsule Commonly known as: SINEQUAN   ergocalciferol 1.25 MG (50000 UT) capsule Commonly known as: VITAMIN D2   escitalopram 10 MG tablet Commonly known as: LEXAPRO       TAKE these medications    acetaminophen 325 MG tablet Commonly known as: TYLENOL Take 650 mg by mouth every 6 (six) hours as needed for mild pain.   allopurinol 300 MG tablet Commonly known as: ZYLOPRIM Take 300 mg by mouth daily.   amLODipine 5 MG tablet Commonly known as: NORVASC Take 5 mg by mouth daily.   atorvastatin 40 MG tablet Commonly known as: LIPITOR Take 40 mg by mouth daily.   Cholecalciferol 25 MCG (1000 UT) tablet Take 1,000 Units by mouth daily.   clomiPHENE 50 MG tablet Commonly known as: CLOMID Take 1/2 tablet daily What changed:  how much to take how to take this when to take this   cyanocobalamin 1000 MCG tablet Take 1,000 mcg by mouth daily.   diclofenac Sodium 1 %  Gel Commonly known as: VOLTAREN Apply topically.   Eliquis 5 MG Tabs tablet Generic drug: apixaban Take 5 mg by mouth 2 (two) times daily.   flecainide 100 MG tablet Commonly known as: TAMBOCOR Take 100 mg by mouth every 12 (twelve) hours.   folic acid 1 MG tablet Commonly known as: FOLVITE Take by mouth.   gabapentin 300 MG capsule Commonly known as: NEURONTIN Take 1-3 tabs at night   LORazepam 2 MG tablet Commonly known as: ATIVAN Take 2 mg by mouth at bedtime as needed. What changed: Another medication with the same name was removed. Continue taking this medication, and follow the directions you see here.   losartan 100 MG tablet Commonly known as: COZAAR Take 1 tablet (100 mg total) by mouth daily. Hold until you see your nephrologist What changed: additional instructions   magnesium oxide 400 (240 Mg) MG tablet Commonly known as: MAG-OX Take 1 tablet by mouth 2 (two) times daily.   metoprolol succinate 100 MG 24 hr tablet Commonly known as: TOPROL-XL Take  100 mg by mouth daily.   Ozempic (2 MG/DOSE) 8 MG/3ML Sopn Generic drug: Semaglutide (2 MG/DOSE) Inject 2 mg into the skin once a week. Monday   pantoprazole 40 MG tablet Commonly known as: PROTONIX Take 40 mg by mouth daily.   sildenafil 20 MG tablet Commonly known as: REVATIO Take 3-5 tablets 1 hr prior to intercourse as needed What changed:  how much to take how to take this when to take this reasons to take this   sodium bicarbonate 650 MG tablet Take 650 mg by mouth 2 (two) times daily.   torsemide 10 MG tablet Commonly known as: DEMADEX Take 1 tablet (10 mg total) by mouth daily. Hold until you see your nephrologist What changed: additional instructions        Follow-up Information     Leonel Ramsay, MD. Schedule an appointment as soon as possible for a visit in 1 week(s).   Specialty: Infectious Diseases Contact information: [place]  [address] [phone]                Discharge Exam: Danley Danker Weights   10/13/21 1008  Weight: 120.2 kg   General.  Obese gentleman, in no acute distress. Pulmonary.  Lungs clear bilaterally, normal respiratory effort. CV.  Regular rate and rhythm, no JVD, rub or murmur. Abdomen.  Soft, nontender, nondistended, BS positive. CNS.  Alert and oriented .  No focal neurologic deficit. Extremities.  No edema, no cyanosis, pulses intact and symmetrical. Psychiatry.  Judgment and insight appears normal.   Condition at discharge: stable  The results of significant diagnostics from this hospitalization (including imaging, microbiology, ancillary and laboratory) are listed below for reference.   Imaging Studies: ECHOCARDIOGRAM COMPLETE  Result Date: 10/14/2021    ECHOCARDIOGRAM REPORT   Patient Name:   Dustin Wells Date of Exam: 10/14/2021 Medical Rec #:  [id number]  Height:       72.0 in Accession #:    [id number] Weight:       265.0 lb Date of Birth:  31-Oct-1965  BSA:          2.401 m Patient Age:    64 years   BP:           131/92 mmHg Patient Gender: M          HR:           89 bpm. Exam Location:  [facility] Procedure: 2D Echo, Cardiac Doppler and Color Doppler Indications:     Syncope R55  History:         Patient has no prior history of Echocardiogram examinations.                  CHF, Arrythmias:Atrial Fibrillation; Risk Factors:Hypertension.  Sonographer:     Sherrie Sport Referring Phys:  [id number] AMY N COX Diagnosing Phys: Ida Rogue MD  Sonographer Comments: Technically difficult study due to poor echo windows, suboptimal parasternal window and suboptimal apical window. IMPRESSIONS  1. Left ventricular ejection fraction, by estimation, is 55 to 60%. The left ventricle has normal function. The left ventricle has no regional wall motion abnormalities. There is mild left ventricular hypertrophy. Left ventricular diastolic parameters were normal.  2. Right ventricular systolic function is normal. The  right ventricular size is normal.  3. Left atrial size was mildly dilated.  4. The mitral valve was not well visualized. No evidence of mitral valve regurgitation. No evidence of mitral stenosis.  5. The aortic valve was not  well visualized. Aortic valve regurgitation is not visualized. No aortic stenosis is present.  6. The inferior vena cava is normal in size with greater than 50% respiratory variability, suggesting right atrial pressure of 3 mmHg. FINDINGS  Left Ventricle: Left ventricular ejection fraction, by estimation, is 55 to 60%. The left ventricle has normal function. The left ventricle has no regional wall motion abnormalities. The left ventricular internal cavity size was normal in size. There is  mild left ventricular hypertrophy. Left ventricular diastolic parameters were normal. Right Ventricle: The right ventricular size is normal. No increase in right ventricular wall thickness. Right ventricular systolic function is normal. Left Atrium: Left atrial size was mildly dilated. Right Atrium: Right atrial size was normal in size. Pericardium: There is no evidence of pericardial effusion. Mitral Valve: The mitral valve was not well visualized. No evidence of mitral valve regurgitation. No evidence of mitral valve stenosis. Tricuspid Valve: The tricuspid valve is not well visualized. Tricuspid valve regurgitation is trivial. No evidence of tricuspid stenosis. Aortic Valve: The aortic valve was not well visualized. Aortic valve regurgitation is not visualized. No aortic stenosis is present. Aortic valve mean gradient measures 6.0 mmHg. Aortic valve peak gradient measures 9.5 mmHg. Aortic valve area, by VTI measures 2.23 cm. Pulmonic Valve: The pulmonic valve was not well visualized. Pulmonic valve regurgitation is not visualized. No evidence of pulmonic stenosis. Aorta: The aortic root is normal in size and structure. Venous: The inferior vena cava is normal in size with greater than 50% respiratory  variability, suggesting right atrial pressure of 3 mmHg. IAS/Shunts: No atrial level shunt detected by color flow Doppler.  LEFT VENTRICLE PLAX 2D LVIDd:         4.50 cm   Diastology LVIDs:         3.30 cm   LV e' medial:   6.64 cm/s LV PW:         1.70 cm   LV E/e' medial: 10.8 LV IVS:        1.50 cm LVOT diam:     2.00 cm LV SV:         51 LV SV Index:   21 LVOT Area:     3.14 cm  RIGHT VENTRICLE RV Basal diam:  4.30 cm RV S prime:     14.60 cm/s TAPSE (M-mode): 2.0 cm LEFT ATRIUM             Index        RIGHT ATRIUM           Index LA diam:        4.00 cm 1.67 cm/m   RA Area:     16.90 cm LA Vol (A2C):   81.4 ml 33.90 ml/m  RA Volume:   43.80 ml  18.24 ml/m LA Vol (A4C):   56.8 ml 23.65 ml/m LA Biplane Vol: 71.8 ml 29.90 ml/m  AORTIC VALVE AV Area (Vmax):    2.20 cm AV Area (Vmean):   2.19 cm AV Area (VTI):     2.23 cm AV Vmax:           154.00 cm/s AV Vmean:          111.000 cm/s AV VTI:            0.227 m AV Peak Grad:      9.5 mmHg AV Mean Grad:      6.0 mmHg LVOT Vmax:         108.00 cm/s LVOT Vmean:  77.500 cm/s LVOT VTI:          0.161 m LVOT/AV VTI ratio: 0.71  AORTA Ao Root diam: 3.05 cm MITRAL VALVE               TRICUSPID VALVE MV Area (PHT): 4.15 cm    TR Peak grad:   7.0 mmHg MV Decel Time: 183 msec    TR Vmax:        132.00 cm/s MV E velocity: 72.00 cm/s MV A velocity: 50.60 cm/s  SHUNTS MV E/A ratio:  1.42        Systemic VTI:  0.16 m                            Systemic Diam: 2.00 cm Ida Rogue MD Electronically signed by Ida Rogue MD Signature Date/Time: 10/14/2021/1:53:41 PM    Final    MR BRAIN WO CONTRAST  Result Date: 10/14/2021 CLINICAL DATA:  56 year old male with altered mental status. Renal insufficiency. Administered MRI contrast yesterday for chest MRA. EXAM: MRI HEAD WITHOUT CONTRAST TECHNIQUE: Multiplanar, multiecho pulse sequences of the brain and surrounding structures were obtained without intravenous contrast. COMPARISON:  Head and cervical spine CT  yesterday. Brain MRI 01/31/2020. FINDINGS: Brain: Chronic encephalomalacia in the right hemisphere, posterior right MCA territory. Chronic ex vacuo enlargement of the atrium of the right lateral ventricle. FLAIR hyperintensity in the cystic areas of encephalomalacia and adjacent sulci probably is artifact related to the recent gadolinium administration. And there is similar mild increased T1 signal there also. But stable appearance of mild underlying hemosiderin on SWI. No restricted diffusion to suggest acute infarction. No midline shift, mass effect, evidence of mass lesion, ventriculomegaly, extra-axial collection or acute intracranial hemorrhage. Cervicomedullary junction and pituitary are within normal limits. Stable gray and white matter signal elsewhere since 2021, scattered nonspecific bilateral white matter T2 and FLAIR hyperintense foci. No other cortical encephalomalacia or chronic cerebral blood products identified. Deep gray matter nuclei, brainstem and cerebellum remain within normal limits. Vascular: Major intracranial vascular flow voids are stable since 2021, dominant appearing distal right vertebral artery. Skull and upper cervical spine: Negative visible cervical spine. Visualized bone marrow signal is within normal limits. Sinuses/Orbits: Stable and negative orbits. Minimal paranasal sinus mucosal thickening or retention cysts have regressed since 2021. Other: Trace mastoid fluid has not significantly changed. Grossly normal visible internal auditory structures otherwise. Negative visible scalp and face. IMPRESSION: 1. No acute intracranial abnormality. 2. Chronic Right MCA territory encephalomalacia. Other mild for age nonspecific white matter changes. Electronically Signed   By: Genevie Ann M.D.   On: 10/14/2021 10:52   US RENAL  Result Date: 10/14/2021 CLINICAL DATA:  Acute kidney injury. EXAM: RENAL / URINARY TRACT ULTRASOUND COMPLETE COMPARISON:  Abdominal ultrasound 07/07/2021 FINDINGS:  Right Kidney: Renal measurements: 11.2 x 5.8 x 5.4 cm = volume: 185 mL. Echogenicity within normal limits. Right renal midpole 1.2 x 0.8 x 0.5 cm simple cyst appears not significantly changed from prior when measured in a similar manner. Additional mid to lower pole 1.1 x 0.8 x 0.6 cm simple cyst. Left Kidney: Renal measurements: 11.6 x 5.5 x 5.7 cm = volume: 189 mL. Echogenicity within normal limits. Left midpole 1.1 x 1.1 x 0.7 cm benign simple cyst. Bladder: Appears normal for degree of bladder distention. Other: None. IMPRESSION: 1. No hydronephrosis within either kidney. 2. Benign bilateral renal simple cysts measuring up to 1.2 cm on the right. Electronically Signed  By: Yvonne Kendall M.D.   On: 10/14/2021 10:02   MR ANGIO CHEST W WO CONTRAST  Result Date: 10/13/2021 CLINICAL DATA:  Syncope, chest pain.  Evaluate for dissection EXAM: MRA CHEST WITH OR WITHOUT CONTRAST TECHNIQUE: Angiographic images of the chest were obtained using MRA technique without and with intravenous contrast. CONTRAST:  42m GADAVIST GADOBUTROL 1 MMOL/ML IV SOLN COMPARISON:  None Available. FINDINGS: VASCULAR Aorta: Conventional 3 vessel arch anatomy. No evidence dissection aneurysm. Heart: Normal in size.  No pericardial effusion. Pulmonary Arteries: Normal in size. No signal abnormality to suggest pulmonary embolism. Other: None. NON-VASCULAR No focal signal abnormality or abnormal enhancement within the lungs, mediastinum, visualized upper abdomen or musculoskeletal soft tissues. IMPRESSION: 1. No evidence of aortic dissection, aneurysm or other acute arterial abnormality. 2. No acute cardiopulmonary process. Electronically Signed   By: HJacqulynn CadetM.D.   On: 10/13/2021 12:57   CT HEAD WO CONTRAST (5MM)  Result Date: 10/13/2021 CLINICAL DATA:  Woke up with weakness and dizziness and fell. EXAM: CT HEAD WITHOUT CONTRAST TECHNIQUE: Contiguous axial images were obtained from the base of the skull through the vertex  without intravenous contrast. RADIATION DOSE REDUCTION: This exam was performed according to the departmental dose-optimization program which includes automated exposure control, adjustment of the mA and/or kV according to patient size and/or use of iterative reconstruction technique. 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 No acute intracranial findings such as hemispheric infarction or intracranial hemorrhage. Stable patchy periventricular white matter disease. No extra-axial fluid collections are identified. No mass lesions. The brainstem and cerebellum are grossly normal. Vascular: Scattered vascular calcifications. No aneurysm or hyperdense vessels. Skull: No skull fracture or bone lesions. Sinuses/Orbits: The paranasal sinuses and mastoid air cells are clear. The globes are intact. Other: No scalp lesions or scalp hematoma. IMPRESSION: 1. 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 2. No acute intracranial findings or mass lesions. Electronically Signed   By: PMarijo SanesM.D.   On: 10/13/2021 11:13   CT Cervical Spine Wo Contrast  Result Date: 10/13/2021 CLINICAL DATA:  Neck trauma. Dangers injury mechanism. Weakness and dizziness. Trauma to the back of the head. EXAM: CT CERVICAL SPINE WITHOUT CONTRAST TECHNIQUE: Multidetector CT imaging of the cervical spine was performed without intravenous contrast. Multiplanar CT image reconstructions were also generated. RADIATION DOSE REDUCTION: This exam was performed according to the departmental dose-optimization program which includes automated exposure control, adjustment of the mA and/or kV according to patient size and/or use of iterative reconstruction technique. COMPARISON:  None FINDINGS: Alignment: Normal Skull base and vertebrae: No fracture or focal bone lesion. Soft  tissues and spinal canal: No traumatic soft tissue finding. Disc levels: Ordinary spondylosis at C6-7 with small endplate osteophytes but no bony stenosis of the canal or foramina. Facet osteoarthritis on the left at C7-T1. Upper chest: Negative Other: None IMPRESSION: No acute or traumatic finding.  Mild cervical degenerative changes. Electronically Signed   By: MNelson ChimesM.D.   On: 10/13/2021 11:03   DG Chest Portable 1 View  Result Date: 10/13/2021 CLINICAL DATA:  Syncopal episode. EXAM: PORTABLE CHEST 1 VIEW COMPARISON:  Radiographs 08/26/2017. FINDINGS: 1038 hours. The heart size appears stable at the upper limits of normal for portable AP technique. Probable mild atelectasis or scarring at both lung bases. No confluent airspace opacity, edema, pneumothorax or significant  pleural effusion. No acute osseous findings are evident. Telemetry leads overlie the chest. IMPRESSION: No evidence of active cardiopulmonary process. Electronically Signed   By: Richardean Sale M.D.   On: 10/13/2021 10:47    Microbiology: Results for orders placed or performed during the hospital encounter of 10/13/21  SARS Coronavirus 2 by RT PCR (hospital order, performed in [facility] hospital lab) *cepheid single result test* Urine, Clean Catch     Status: None   Collection Time: 10/13/21  4:29 PM   Specimen: Urine, Clean Catch; Nasal Swab  Result Value Ref Range Status   SARS Coronavirus 2 by RT PCR NEGATIVE NEGATIVE Final    Comment: (NOTE) SARS-CoV-2 target nucleic acids are NOT DETECTED.  The SARS-CoV-2 RNA is generally detectable in upper and lower respiratory specimens during the acute phase of infection. The lowest concentration of SARS-CoV-2 viral copies this assay can detect is 250 copies / mL. A negative result does not preclude SARS-CoV-2 infection and should not be used as the sole basis for treatment or other patient management decisions.  A negative result may occur with improper specimen collection  / handling, submission of specimen other than nasopharyngeal swab, presence of viral mutation(s) within the areas targeted by this assay, and inadequate number of viral copies (<250 copies / mL). A negative result must be combined with clinical observations, patient history, and epidemiological information.  Fact Sheet for Patients:   https://www.patel.info/  Fact Sheet for Healthcare Providers: https://hall.com/  This test is not yet approved or  cleared by the [place] FDA and has been authorized for detection and/or diagnosis of SARS-CoV-2 by FDA under an Emergency Use Authorization (EUA).  This EUA will remain in effect (meaning this test can be used) for the duration of the COVID-19 declaration under Section 564(b)(1) of the Act, 21 U.S.C. section 360bbb-3(b)(1), unless the authorization is terminated or revoked sooner.  Performed at [facility], [address] [place], [place] [address]     Labs: CBC: Recent Labs  Lab 10/13/21 1026 10/14/21 0652  WBC 5.9 4.4  NEUTROABS 4.4  --   HGB 14.5 12.7*  HCT 43.7 37.4*  MCV 93.8 92.1  PLT 101* 91*   Basic Metabolic Panel: Recent Labs  Lab 10/13/21 1026 10/14/21 0652  NA 136 137  K 3.8 3.8  CL 99 105  CO2 24 22  GLUCOSE 138* 105*  BUN 47* 45*  CREATININE 4.17* 3.19*  CALCIUM 9.2 9.2  MG 1.9  --    Liver Function Tests: Recent Labs  Lab 10/13/21 1026  AST 62*  ALT 41  ALKPHOS 78  BILITOT 1.2  PROT 7.8  ALBUMIN 3.7   CBG: No results for input(s): "GLUCAP" in the last 168 hours.  Discharge time spent: greater than 30 minutes.  This record has been created using Systems analyst. Errors have been sought and corrected,but may not always be located. Such creation errors do not reflect on the standard of care.   Signed: Lorella Nimrod, MD [facility] 10/14/2021

## 2021-10-15 ENCOUNTER — Emergency Department: Payer: 59

## 2021-10-15 ENCOUNTER — Other Ambulatory Visit: Payer: Self-pay

## 2021-10-15 ENCOUNTER — Emergency Department
Admission: EM | Admit: 2021-10-15 | Discharge: 2021-10-15 | Discharge status: Absent without leave | Payer: 59 | Source: Home / Self Care | Attending: Emergency Medicine | Admitting: Emergency Medicine

## 2021-10-15 ENCOUNTER — Inpatient Hospital Stay: Payer: 59

## 2021-10-15 ENCOUNTER — Encounter: Payer: Self-pay | Admitting: Internal Medicine

## 2021-10-15 ENCOUNTER — Inpatient Hospital Stay
Admission: EM | Admit: 2021-10-15 | Discharge: 2021-10-20 | DRG: 871 | Disposition: A | Discharge status: Other Acute Inpatient Hospital | Payer: 59 | Attending: Family Medicine | Admitting: Family Medicine

## 2021-10-15 DIAGNOSIS — R7303 Prediabetes: Secondary | ICD-10-CM | POA: Diagnosis present

## 2021-10-15 DIAGNOSIS — D6959 Other secondary thrombocytopenia: Secondary | ICD-10-CM | POA: Diagnosis present

## 2021-10-15 DIAGNOSIS — R6510 Systemic inflammatory response syndrome (SIRS) of non-infectious origin without acute organ dysfunction: Secondary | ICD-10-CM | POA: Diagnosis not present

## 2021-10-15 DIAGNOSIS — I482 Chronic atrial fibrillation, unspecified: Secondary | ICD-10-CM | POA: Insufficient documentation

## 2021-10-15 DIAGNOSIS — F1020 Alcohol dependence, uncomplicated: Secondary | ICD-10-CM | POA: Diagnosis not present

## 2021-10-15 DIAGNOSIS — N1832 Chronic kidney disease, stage 3b: Secondary | ICD-10-CM

## 2021-10-15 DIAGNOSIS — Z8673 Personal history of transient ischemic attack (TIA), and cerebral infarction without residual deficits: Secondary | ICD-10-CM

## 2021-10-15 DIAGNOSIS — R4182 Altered mental status, unspecified: Secondary | ICD-10-CM | POA: Diagnosis not present

## 2021-10-15 DIAGNOSIS — Z6835 Body mass index (BMI) 35.0-35.9, adult: Secondary | ICD-10-CM

## 2021-10-15 DIAGNOSIS — F10239 Alcohol dependence with withdrawal, unspecified: Secondary | ICD-10-CM | POA: Diagnosis present

## 2021-10-15 DIAGNOSIS — I130 Hypertensive heart and chronic kidney disease with heart failure and stage 1 through stage 4 chronic kidney disease, or unspecified chronic kidney disease: Secondary | ICD-10-CM | POA: Diagnosis present

## 2021-10-15 DIAGNOSIS — R252 Cramp and spasm: Secondary | ICD-10-CM | POA: Diagnosis not present

## 2021-10-15 DIAGNOSIS — E669 Obesity, unspecified: Secondary | ICD-10-CM

## 2021-10-15 DIAGNOSIS — I5032 Chronic diastolic (congestive) heart failure: Secondary | ICD-10-CM

## 2021-10-15 DIAGNOSIS — R414 Neurologic neglect syndrome: Secondary | ICD-10-CM | POA: Diagnosis not present

## 2021-10-15 DIAGNOSIS — I959 Hypotension, unspecified: Secondary | ICD-10-CM | POA: Diagnosis present

## 2021-10-15 DIAGNOSIS — G0490 Encephalitis and encephalomyelitis, unspecified: Secondary | ICD-10-CM | POA: Insufficient documentation

## 2021-10-15 DIAGNOSIS — A419 Sepsis, unspecified organism: Secondary | ICD-10-CM | POA: Diagnosis present

## 2021-10-15 DIAGNOSIS — R161 Splenomegaly, not elsewhere classified: Secondary | ICD-10-CM | POA: Diagnosis present

## 2021-10-15 DIAGNOSIS — R55 Syncope and collapse: Secondary | ICD-10-CM | POA: Diagnosis present

## 2021-10-15 DIAGNOSIS — N179 Acute kidney failure, unspecified: Secondary | ICD-10-CM | POA: Diagnosis present

## 2021-10-15 DIAGNOSIS — J9601 Acute respiratory failure with hypoxia: Secondary | ICD-10-CM | POA: Insufficient documentation

## 2021-10-15 DIAGNOSIS — I10 Essential (primary) hypertension: Secondary | ICD-10-CM

## 2021-10-15 DIAGNOSIS — Z20822 Contact with and (suspected) exposure to covid-19: Secondary | ICD-10-CM | POA: Diagnosis present

## 2021-10-15 DIAGNOSIS — Z8249 Family history of ischemic heart disease and other diseases of the circulatory system: Secondary | ICD-10-CM

## 2021-10-15 DIAGNOSIS — Z888 Allergy status to other drugs, medicaments and biological substances status: Secondary | ICD-10-CM

## 2021-10-15 DIAGNOSIS — G40101 Localization-related (focal) (partial) symptomatic epilepsy and epileptic syndromes with simple partial seizures, not intractable, with status epilepticus: Secondary | ICD-10-CM | POA: Diagnosis not present

## 2021-10-15 DIAGNOSIS — F411 Generalized anxiety disorder: Secondary | ICD-10-CM | POA: Diagnosis present

## 2021-10-15 DIAGNOSIS — F331 Major depressive disorder, recurrent, moderate: Secondary | ICD-10-CM | POA: Diagnosis present

## 2021-10-15 DIAGNOSIS — K449 Diaphragmatic hernia without obstruction or gangrene: Secondary | ICD-10-CM | POA: Diagnosis present

## 2021-10-15 DIAGNOSIS — A4189 Other specified sepsis: Secondary | ICD-10-CM | POA: Diagnosis present

## 2021-10-15 DIAGNOSIS — E860 Dehydration: Secondary | ICD-10-CM | POA: Diagnosis present

## 2021-10-15 DIAGNOSIS — Z923 Personal history of irradiation: Secondary | ICD-10-CM

## 2021-10-15 DIAGNOSIS — D696 Thrombocytopenia, unspecified: Secondary | ICD-10-CM | POA: Insufficient documentation

## 2021-10-15 DIAGNOSIS — C6290 Malignant neoplasm of unspecified testis, unspecified whether descended or undescended: Secondary | ICD-10-CM | POA: Diagnosis not present

## 2021-10-15 DIAGNOSIS — Z7901 Long term (current) use of anticoagulants: Secondary | ICD-10-CM

## 2021-10-15 DIAGNOSIS — E785 Hyperlipidemia, unspecified: Secondary | ICD-10-CM | POA: Diagnosis present

## 2021-10-15 DIAGNOSIS — R531 Weakness: Secondary | ICD-10-CM

## 2021-10-15 DIAGNOSIS — F1010 Alcohol abuse, uncomplicated: Secondary | ICD-10-CM | POA: Insufficient documentation

## 2021-10-15 DIAGNOSIS — G4733 Obstructive sleep apnea (adult) (pediatric): Secondary | ICD-10-CM | POA: Diagnosis present

## 2021-10-15 DIAGNOSIS — F418 Other specified anxiety disorders: Secondary | ICD-10-CM | POA: Diagnosis not present

## 2021-10-15 DIAGNOSIS — F329 Major depressive disorder, single episode, unspecified: Secondary | ICD-10-CM | POA: Insufficient documentation

## 2021-10-15 DIAGNOSIS — G9341 Metabolic encephalopathy: Secondary | ICD-10-CM | POA: Diagnosis present

## 2021-10-15 DIAGNOSIS — D649 Anemia, unspecified: Secondary | ICD-10-CM | POA: Diagnosis present

## 2021-10-15 DIAGNOSIS — B9789 Other viral agents as the cause of diseases classified elsewhere: Secondary | ICD-10-CM | POA: Diagnosis present

## 2021-10-15 DIAGNOSIS — Z882 Allergy status to sulfonamides status: Secondary | ICD-10-CM

## 2021-10-15 DIAGNOSIS — G9340 Encephalopathy, unspecified: Secondary | ICD-10-CM | POA: Diagnosis present

## 2021-10-15 DIAGNOSIS — R933 Abnormal findings on diagnostic imaging of other parts of digestive tract: Secondary | ICD-10-CM | POA: Diagnosis present

## 2021-10-15 DIAGNOSIS — R32 Unspecified urinary incontinence: Secondary | ICD-10-CM | POA: Diagnosis present

## 2021-10-15 DIAGNOSIS — G40001 Localization-related (focal) (partial) idiopathic epilepsy and epileptic syndromes with seizures of localized onset, not intractable, with status epilepticus: Secondary | ICD-10-CM | POA: Diagnosis not present

## 2021-10-15 DIAGNOSIS — D849 Immunodeficiency, unspecified: Secondary | ICD-10-CM | POA: Diagnosis present

## 2021-10-15 DIAGNOSIS — R7989 Other specified abnormal findings of blood chemistry: Secondary | ICD-10-CM

## 2021-10-15 DIAGNOSIS — B1001 Human herpesvirus 6 encephalitis: Secondary | ICD-10-CM | POA: Diagnosis present

## 2021-10-15 DIAGNOSIS — R140 Abdominal distension (gaseous): Secondary | ICD-10-CM | POA: Diagnosis not present

## 2021-10-15 DIAGNOSIS — Z8547 Personal history of malignant neoplasm of testis: Secondary | ICD-10-CM

## 2021-10-15 DIAGNOSIS — R569 Unspecified convulsions: Secondary | ICD-10-CM | POA: Diagnosis not present

## 2021-10-15 DIAGNOSIS — K5730 Diverticulosis of large intestine without perforation or abscess without bleeding: Secondary | ICD-10-CM | POA: Diagnosis present

## 2021-10-15 DIAGNOSIS — R319 Hematuria, unspecified: Secondary | ICD-10-CM | POA: Diagnosis not present

## 2021-10-15 DIAGNOSIS — M109 Gout, unspecified: Secondary | ICD-10-CM | POA: Diagnosis present

## 2021-10-15 DIAGNOSIS — K219 Gastro-esophageal reflux disease without esophagitis: Secondary | ICD-10-CM | POA: Diagnosis present

## 2021-10-15 LAB — APTT
aPTT: 37 seconds — ABNORMAL HIGH (ref 24–36)
aPTT: 37 seconds — ABNORMAL HIGH (ref 24–36)

## 2021-10-15 LAB — COMPREHENSIVE METABOLIC PANEL
ALT: 35 U/L (ref 0–44)
AST: 60 U/L — ABNORMAL HIGH (ref 15–41)
Albumin: 3.4 g/dL — ABNORMAL LOW (ref 3.5–5.0)
Alkaline Phosphatase: 68 U/L (ref 38–126)
Anion gap: 9 (ref 5–15)
BUN: 24 mg/dL — ABNORMAL HIGH (ref 6–20)
CO2: 22 mmol/L (ref 22–32)
Calcium: 8.8 mg/dL — ABNORMAL LOW (ref 8.9–10.3)
Chloride: 105 mmol/L (ref 98–111)
Creatinine, Ser: 2.05 mg/dL — ABNORMAL HIGH (ref 0.61–1.24)
GFR, Estimated: 38 mL/min — ABNORMAL LOW (ref >60)
Glucose, Bld: 134 mg/dL — ABNORMAL HIGH (ref 70–99)
Potassium: 3.9 mmol/L (ref 3.5–5.1)
Sodium: 136 mmol/L (ref 135–145)
Total Bilirubin: 1.6 mg/dL — ABNORMAL HIGH (ref 0.3–1.2)
Total Protein: 7.2 g/dL (ref 6.5–8.1)

## 2021-10-15 LAB — CBC WITH DIFFERENTIAL/PLATELET
Abs Immature Granulocytes: 0.27 10*3/uL — ABNORMAL HIGH (ref 0.00–0.07)
Abs Immature Granulocytes: 0.2 10*3/uL — ABNORMAL HIGH (ref 0.00–0.07)
Basophils Absolute: 0.1 10*3/uL (ref 0.0–0.1)
Basophils Absolute: 0.1 10*3/uL (ref 0.0–0.1)
Basophils Relative: 1 %
Basophils Relative: 1 %
Eosinophils Absolute: 0 10*3/uL (ref 0.0–0.5)
Eosinophils Absolute: 0 10*3/uL (ref 0.0–0.5)
Eosinophils Relative: 0 %
Eosinophils Relative: 0 %
HCT: 40.2 % (ref 39.0–52.0)
HCT: 37.8 % — ABNORMAL LOW (ref 39.0–52.0)
Hemoglobin: 13.5 g/dL (ref 13.0–17.0)
Hemoglobin: 13.1 g/dL (ref 13.0–17.0)
Immature Granulocytes: 3 %
Immature Granulocytes: 2 %
Lymphocytes Relative: 10 %
Lymphocytes Relative: 8 %
Lymphs Abs: 0.8 10*3/uL (ref 0.7–4.0)
Lymphs Abs: 0.7 10*3/uL (ref 0.7–4.0)
MCH: 30.7 pg (ref 26.0–34.0)
MCH: 31.3 pg (ref 26.0–34.0)
MCHC: 33.6 g/dL (ref 30.0–36.0)
MCHC: 34.7 g/dL (ref 30.0–36.0)
MCV: 91.4 fL (ref 80.0–100.0)
MCV: 90.2 fL (ref 80.0–100.0)
Monocytes Absolute: 1.3 10*3/uL — ABNORMAL HIGH (ref 0.1–1.0)
Monocytes Absolute: 0.7 10*3/uL (ref 0.1–1.0)
Monocytes Relative: 15 %
Monocytes Relative: 8 %
Neutro Abs: 6.2 10*3/uL (ref 1.7–7.7)
Neutro Abs: 7 10*3/uL (ref 1.7–7.7)
Neutrophils Relative %: 71 %
Neutrophils Relative %: 81 %
Platelets: 128 10*3/uL — ABNORMAL LOW (ref 150–400)
Platelets: 107 10*3/uL — ABNORMAL LOW (ref 150–400)
RBC: 4.4 MIL/uL (ref 4.22–5.81)
RBC: 4.19 MIL/uL — ABNORMAL LOW (ref 4.22–5.81)
RDW: 14 % (ref 11.5–15.5)
RDW: 14.1 % (ref 11.5–15.5)
WBC: 8.7 10*3/uL (ref 4.0–10.5)
WBC: 8.6 10*3/uL (ref 4.0–10.5)
nRBC: 0 % (ref 0.0–0.2)
nRBC: 0 % (ref 0.0–0.2)

## 2021-10-15 LAB — CSF CELL COUNT WITH DIFFERENTIAL
Eosinophils, CSF: 0 %
Lymphs, CSF: 19 %
Monocyte-Macrophage-Spinal Fluid: 6 %
RBC Count, CSF: 12 /mm3 — ABNORMAL HIGH (ref 0–3)
Segmented Neutrophils-CSF: 75 %
Tube #: 3
WBC, CSF: 804 /mm3 (ref 0–5)

## 2021-10-15 LAB — LACTIC ACID, PLASMA
Lactic Acid, Venous: 1.6 mmol/L (ref 0.5–1.9)
Lactic Acid, Venous: 1.1 mmol/L (ref 0.5–1.9)
Lactic Acid, Venous: 0.9 mmol/L (ref 0.5–1.9)

## 2021-10-15 LAB — LIPASE, BLOOD: Lipase: 78 U/L — ABNORMAL HIGH (ref 11–51)

## 2021-10-15 LAB — URINE DRUG SCREEN, QUALITATIVE (ARMC ONLY)
Amphetamines, Ur Screen: NOT DETECTED
Barbiturates, Ur Screen: NOT DETECTED
Benzodiazepine, Ur Scrn: NOT DETECTED
Cannabinoid 50 Ng, Ur ~~LOC~~: NOT DETECTED
Cocaine Metabolite,Ur ~~LOC~~: NOT DETECTED
MDMA (Ecstasy)Ur Screen: NOT DETECTED
Methadone Scn, Ur: NOT DETECTED
Opiate, Ur Screen: NOT DETECTED
Phencyclidine (PCP) Ur S: NOT DETECTED
Tricyclic, Ur Screen: NOT DETECTED

## 2021-10-15 LAB — CRYPTOCOCCAL ANTIGEN, CSF: Crypto Ag: NEGATIVE

## 2021-10-15 LAB — BLOOD GAS, ARTERIAL
Acid-base deficit: 0.9 mmol/L (ref 0.0–2.0)
Bicarbonate: 21.3 mmol/L (ref 20.0–28.0)
O2 Content: 4 L/min
O2 Saturation: 99.5 %
Patient temperature: 37
pCO2 arterial: 28 mmHg — ABNORMAL LOW (ref 32–48)
pH, Arterial: 7.49 — ABNORMAL HIGH (ref 7.35–7.45)
pO2, Arterial: 93 mmHg (ref 83–108)

## 2021-10-15 LAB — PROTEIN AND GLUCOSE, CSF
Glucose, CSF: 54 mg/dL (ref 40–70)
Total  Protein, CSF: 103 mg/dL — ABNORMAL HIGH (ref 15–45)

## 2021-10-15 LAB — PROTIME-INR
INR: 1.5 — ABNORMAL HIGH (ref 0.8–1.2)
INR: 1.5 — ABNORMAL HIGH (ref 0.8–1.2)
Prothrombin Time: 17.8 seconds — ABNORMAL HIGH (ref 11.4–15.2)
Prothrombin Time: 18.1 seconds — ABNORMAL HIGH (ref 11.4–15.2)

## 2021-10-15 LAB — URINALYSIS, COMPLETE (UACMP) WITH MICROSCOPIC
Bacteria, UA: NONE SEEN
Bilirubin Urine: NEGATIVE
Glucose, UA: 50 mg/dL — AB
Ketones, ur: NEGATIVE mg/dL
Leukocytes,Ua: NEGATIVE
Nitrite: NEGATIVE
Protein, ur: 30 mg/dL — AB
Specific Gravity, Urine: 1.012 (ref 1.005–1.030)
pH: 6 (ref 5.0–8.0)

## 2021-10-15 LAB — RESP PANEL BY RT-PCR (FLU A&B, COVID) ARPGX2
Influenza A by PCR: NEGATIVE
Influenza B by PCR: NEGATIVE
SARS Coronavirus 2 by RT PCR: NEGATIVE

## 2021-10-15 LAB — PROCALCITONIN: Procalcitonin: 1.22 ng/mL

## 2021-10-15 LAB — CBG MONITORING, ED: Glucose-Capillary: 117 mg/dL — ABNORMAL HIGH (ref 70–99)

## 2021-10-15 LAB — BRAIN NATRIURETIC PEPTIDE: B Natriuretic Peptide: 256.2 pg/mL — ABNORMAL HIGH (ref 0.0–100.0)

## 2021-10-15 LAB — AMMONIA: Ammonia: 32 umol/L (ref 9–35)

## 2021-10-15 LAB — GLUCOSE, CAPILLARY: Glucose-Capillary: 161 mg/dL — ABNORMAL HIGH (ref 70–99)

## 2021-10-15 MED ORDER — ONDANSETRON HCL 4 MG/2ML IJ SOLN: 4.0000 mg | Freq: Three times a day (TID) | INTRAMUSCULAR | Status: DC | PRN

## 2021-10-15 MED ORDER — LACTATED RINGERS IV BOLUS (SEPSIS)
1000.0000 mL | Freq: Once | INTRAVENOUS | Status: AC
Start: 2021-10-15 — End: 2021-10-15
  Administered 2021-10-15: 1000 mL via INTRAVENOUS

## 2021-10-15 MED ORDER — APIXABAN 5 MG PO TABS
5.0000 mg | ORAL_TABLET | Freq: Two times a day (BID) | ORAL | Status: DC
  Administered 2021-10-16 – 2021-10-19 (×8): 5 mg via ORAL
  Filled 2021-10-15 (×8): qty 1

## 2021-10-15 MED ORDER — LIDOCAINE HCL (PF) 1 % IJ SOLN
5.0000 mL | Freq: Once | INTRAMUSCULAR | Status: AC
  Administered 2021-10-15: 3 mL via INTRADERMAL

## 2021-10-15 MED ORDER — PANTOPRAZOLE SODIUM 40 MG PO TBEC
40.0000 mg | DELAYED_RELEASE_TABLET | Freq: Every day | ORAL | Status: DC
  Administered 2021-10-16 – 2021-10-20 (×5): 40 mg via ORAL
  Filled 2021-10-15 (×5): qty 1

## 2021-10-15 MED ORDER — LORAZEPAM 2 MG/ML IJ SOLN: 0.0000 mg | Freq: Four times a day (QID) | INTRAMUSCULAR | Status: AC

## 2021-10-15 MED ORDER — ALBUTEROL SULFATE (2.5 MG/3ML) 0.083% IN NEBU: 2.5000 mg | INHALATION_SOLUTION | RESPIRATORY_TRACT | Status: DC | PRN

## 2021-10-15 MED ORDER — ALLOPURINOL 100 MG PO TABS
300.0000 mg | ORAL_TABLET | Freq: Every day | ORAL | Status: DC
  Administered 2021-10-16 – 2021-10-20 (×5): 300 mg via ORAL
  Filled 2021-10-15 (×5): qty 3

## 2021-10-15 MED ORDER — DEXAMETHASONE SODIUM PHOSPHATE 10 MG/ML IJ SOLN
10.0000 mg | Freq: Once | INTRAMUSCULAR | Status: AC
  Administered 2021-10-15: 10 mg via INTRAVENOUS
  Filled 2021-10-15: qty 1

## 2021-10-15 MED ORDER — LACTATED RINGERS IV BOLUS (SEPSIS): 1000.0000 mL | Freq: Once | INTRAVENOUS | Status: DC

## 2021-10-15 MED ORDER — SODIUM CHLORIDE 0.9 % IV SOLN
1.0000 g | Freq: Once | INTRAVENOUS | Status: AC
  Administered 2021-10-15: 1 g via INTRAVENOUS
  Filled 2021-10-15: qty 10

## 2021-10-15 MED ORDER — VANCOMYCIN HCL 1250 MG/250ML IV SOLN
1250.0000 mg | INTRAVENOUS | Status: DC
  Filled 2021-10-15: qty 250

## 2021-10-15 MED ORDER — ACETAMINOPHEN 325 MG PO TABS
650.0000 mg | ORAL_TABLET | Freq: Four times a day (QID) | ORAL | Status: DC | PRN
  Administered 2021-10-19: 650 mg via ORAL
  Filled 2021-10-15: qty 2

## 2021-10-15 MED ORDER — SODIUM CHLORIDE 0.9 % IV SOLN
3.0000 g | Freq: Four times a day (QID) | INTRAVENOUS | Status: DC
  Administered 2021-10-15 – 2021-10-16 (×3): 3 g via INTRAVENOUS
  Filled 2021-10-15 (×5): qty 8

## 2021-10-15 MED ORDER — LACTATED RINGERS IV BOLUS (SEPSIS)
1000.0000 mL | Freq: Once | INTRAVENOUS | Status: AC
  Administered 2021-10-15: 1000 mL via INTRAVENOUS

## 2021-10-15 MED ORDER — CLOMIPHENE CITRATE 50 MG PO TABS: 25.0000 mg | ORAL_TABLET | Freq: Every day | ORAL | Status: DC

## 2021-10-15 MED ORDER — LORAZEPAM 2 MG/ML IJ SOLN: 1.0000 mg | INTRAMUSCULAR | Status: AC | PRN

## 2021-10-15 MED ORDER — LACTATED RINGERS IV SOLN: INTRAVENOUS | Status: DC

## 2021-10-15 MED ORDER — SODIUM CHLORIDE 0.9 % IV SOLN: 2.0000 g | Freq: Two times a day (BID) | INTRAVENOUS | Status: DC

## 2021-10-15 MED ORDER — HYDRALAZINE HCL 20 MG/ML IJ SOLN
5.0000 mg | INTRAMUSCULAR | Status: DC | PRN
Start: 2021-10-15 — End: 2021-10-20

## 2021-10-15 MED ORDER — INSULIN ASPART 100 UNIT/ML IJ SOLN: 0.0000 [IU] | Freq: Every day | INTRAMUSCULAR | Status: DC

## 2021-10-15 MED ORDER — THIAMINE HCL 100 MG/ML IJ SOLN: 100.0000 mg | Freq: Every day | INTRAMUSCULAR | Status: DC

## 2021-10-15 MED ORDER — VITAMIN B-12 1000 MCG PO TABS
1000.0000 ug | ORAL_TABLET | Freq: Every day | ORAL | Status: DC
  Administered 2021-10-15 – 2021-10-20 (×6): 1000 ug via ORAL
  Filled 2021-10-15 (×6): qty 1

## 2021-10-15 MED ORDER — LORAZEPAM 2 MG PO TABS
2.0000 mg | ORAL_TABLET | Freq: Every evening | ORAL | Status: DC | PRN
Start: 2021-10-15 — End: 2021-10-20

## 2021-10-15 MED ORDER — INSULIN ASPART 100 UNIT/ML IJ SOLN
0.0000 [IU] | Freq: Three times a day (TID) | INTRAMUSCULAR | Status: DC
  Administered 2021-10-16: 2 [IU] via SUBCUTANEOUS
  Administered 2021-10-16: 3 [IU] via SUBCUTANEOUS
  Administered 2021-10-16 – 2021-10-17 (×4): 2 [IU] via SUBCUTANEOUS
  Filled 2021-10-15 (×5): qty 1

## 2021-10-15 MED ORDER — SODIUM CHLORIDE 0.9 % IV SOLN: 2.0000 g | Freq: Once | INTRAVENOUS | Status: DC

## 2021-10-15 MED ORDER — VITAMIN D 25 MCG (1000 UNIT) PO TABS
1000.0000 [IU] | ORAL_TABLET | Freq: Every day | ORAL | Status: DC
  Administered 2021-10-15 – 2021-10-20 (×6): 1000 [IU] via ORAL
  Filled 2021-10-15 (×6): qty 1

## 2021-10-15 MED ORDER — SODIUM CHLORIDE 0.9 % IV SOLN
2.0000 g | Freq: Two times a day (BID) | INTRAVENOUS | Status: DC
  Administered 2021-10-16: 2 g via INTRAVENOUS
  Filled 2021-10-15 (×2): qty 20

## 2021-10-15 MED ORDER — ADULT MULTIVITAMIN W/MINERALS CH
1.0000 | ORAL_TABLET | Freq: Every day | ORAL | Status: DC
Start: 2021-10-15 — End: 2021-10-20
  Administered 2021-10-15 – 2021-10-20 (×6): 1 via ORAL
  Filled 2021-10-15 (×6): qty 1

## 2021-10-15 MED ORDER — LORAZEPAM 1 MG PO TABS: 1.0000 mg | ORAL_TABLET | ORAL | Status: AC | PRN

## 2021-10-15 MED ORDER — FUROSEMIDE 10 MG/ML IJ SOLN
40.0000 mg | Freq: Two times a day (BID) | INTRAMUSCULAR | Status: DC
  Administered 2021-10-15 – 2021-10-17 (×4): 40 mg via INTRAVENOUS
  Filled 2021-10-15 (×4): qty 4

## 2021-10-15 MED ORDER — VANCOMYCIN HCL 10 G IV SOLR
2500.0000 mg | Freq: Once | INTRAVENOUS | Status: AC
  Administered 2021-10-15: 2500 mg via INTRAVENOUS
  Filled 2021-10-15: qty 25

## 2021-10-15 MED ORDER — SODIUM BICARBONATE 650 MG PO TABS
650.0000 mg | ORAL_TABLET | Freq: Two times a day (BID) | ORAL | Status: DC
  Administered 2021-10-15 – 2021-10-20 (×10): 650 mg via ORAL
  Filled 2021-10-15 (×11): qty 1

## 2021-10-15 MED ORDER — SODIUM CHLORIDE 0.9 % IV BOLUS
1000.0000 mL | Freq: Once | INTRAVENOUS | Status: AC
  Administered 2021-10-15: 1000 mL via INTRAVENOUS

## 2021-10-15 MED ORDER — SODIUM CHLORIDE 0.9 % IV BOLUS: 2000.0000 mL | Freq: Once | INTRAVENOUS | Status: DC

## 2021-10-15 MED ORDER — DM-GUAIFENESIN ER 30-600 MG PO TB12: 1.0000 | ORAL_TABLET | Freq: Two times a day (BID) | ORAL | Status: DC | PRN

## 2021-10-15 MED ORDER — LORAZEPAM 2 MG/ML IJ SOLN: 0.0000 mg | Freq: Two times a day (BID) | INTRAMUSCULAR | Status: AC

## 2021-10-15 MED ORDER — FLECAINIDE ACETATE 50 MG PO TABS
100.0000 mg | ORAL_TABLET | Freq: Two times a day (BID) | ORAL | Status: DC
  Administered 2021-10-15 – 2021-10-20 (×10): 100 mg via ORAL
  Filled 2021-10-15 (×4): qty 2
  Filled 2021-10-15: qty 1
  Filled 2021-10-15 (×6): qty 2

## 2021-10-15 MED ORDER — ATORVASTATIN CALCIUM 20 MG PO TABS
40.0000 mg | ORAL_TABLET | Freq: Every day | ORAL | Status: DC
  Administered 2021-10-15 – 2021-10-20 (×5): 40 mg via ORAL
  Filled 2021-10-15 (×6): qty 2

## 2021-10-15 MED ORDER — METOPROLOL SUCCINATE ER 50 MG PO TB24
100.0000 mg | ORAL_TABLET | Freq: Every day | ORAL | Status: DC
  Administered 2021-10-15: 100 mg via ORAL
  Filled 2021-10-15: qty 2

## 2021-10-15 MED ORDER — FOLIC ACID 1 MG PO TABS
1.0000 mg | ORAL_TABLET | Freq: Every day | ORAL | Status: DC
Start: 2021-10-15 — End: 2021-10-20
  Administered 2021-10-15 – 2021-10-20 (×6): 1 mg via ORAL
  Filled 2021-10-15 (×6): qty 1

## 2021-10-15 MED ORDER — THIAMINE HCL 100 MG PO TABS
100.0000 mg | ORAL_TABLET | Freq: Every day | ORAL | Status: DC
Start: 2021-10-15 — End: 2021-10-20
  Administered 2021-10-15 – 2021-10-20 (×6): 100 mg via ORAL
  Filled 2021-10-15 (×7): qty 1

## 2021-10-15 MED ORDER — DEXTROSE 5 % IV SOLN
10.0000 mg/kg | Freq: Two times a day (BID) | INTRAVENOUS | Status: DC
  Administered 2021-10-15 – 2021-10-16 (×2): 945 mg via INTRAVENOUS
  Filled 2021-10-15 (×4): qty 18.9

## 2021-10-15 NOTE — Assessment & Plan Note (Signed)
Patient has mild abnormal liver function with AST 78, ALT 64, total bilirubin 1.7, ALP 66.  Likely due to alcohol abuse and ongoing infection. -Judicious use Tylenol (patient cannot use NSAID for fever due to CKD)

## 2021-10-15 NOTE — Assessment & Plan Note (Addendum)
Resolved.  Patient is now on room air. Etiology is not clear.  May be secondary to sepsis and encephalitis.  Chest x-ray negative for effusion.  COVID PCR negative.  Patient is taking Eliquis, less likely to have PE.  Patient has history of diastolic CHF, but due to obesity, it is very difficult to assess volume status.  BNP at 256.  Patient did received IV fluid per sepsis protocol. -Continue Lasix 40 mg bid -Bronchodilators

## 2021-10-15 NOTE — Assessment & Plan Note (Signed)
Patient had worsening kidney function in recent admission, creatinine was up to 4.17 on 10/13/2021, which improved to 3.19 at discharge.  Today his creatinine is at 2.67, BUN 29, improving. -Follow-up renal function by BMP

## 2021-10-15 NOTE — Assessment & Plan Note (Signed)
-   CIWA protocol

## 2021-10-15 NOTE — ED Triage Notes (Signed)
Arrives via [facility]  Seen and treated for dehydration yesterday.  EMS called to home, patient c/o weakness, CBG:  196.  Initial OX sat 86%, titrated oxygen upto 4l/ [place], sats improved to 96%.  Patient with history of diarrhea and urinary incontinence.   Hx Afib, but currently 110 ST.  110/50.  Per report, patient unable to stand with assistance, confused, poorly following commands.  Wife told EMS that since yesterday, mental status has continued to decline.  20g right hand.  Received 300 cc NS PTA

## 2021-10-15 NOTE — Sepsis Progress Note (Signed)
Elink monitoring code sepsis 

## 2021-10-15 NOTE — ED Notes (Signed)
Pt transported to radiology.

## 2021-10-15 NOTE — Assessment & Plan Note (Signed)
-  on clomiphene

## 2021-10-15 NOTE — ED Provider Notes (Signed)
[facility] Provider Note   Event Date/Time   First MD Initiated Contact with Patient 10/15/21 1007     (approximate) History  No chief complaint on file.  HPI Dustin Wells is a 56 y.o. male with a past medical history of prediabetes, atrial fibrillation on apixaban, and recently admitted for acute kidney injury on chronic kidney disease who presents via EMS for significant weakness this morning.  Patient was just discharged on 10/14/2021 after being admitted for acute on chronic kidney disease and syncope.  Patient is a poor historian and only states that he "does not feel good".  EMS noted on transport that patient is slow to answer questions and has also been leaning to the right during transport.  EMS also noted patient to be 85% on room air and placed on 4 L nasal cannula with resolution to 100%   Physical Exam  Triage Vital Signs: ED Triage Vitals  Enc Vitals Group     BP      Pulse      Resp      Temp      Temp src      SpO2      Weight      Height      Head Circumference      Peak Flow      Pain Score      Pain Loc      Pain Edu?      Excl. in [place]?    Most recent vital signs: Vitals:   10/15/21 1300 10/15/21 1330  BP: 135/79 (!) 147/83  Pulse: 96 97  Resp: (!) 22 (!) 30  Temp:    SpO2: 100% 97%   General: Awake, cooperative.  Slow to answer questions but oriented x3 CV:  Good peripheral perfusion.  Resp:  Increased effort.  Abd:  No distention.  Other:  Middle-aged obese Caucasian male laying in bed with oxygen in place ED Results / Procedures / Treatments  Labs (all labs ordered are listed, but only abnormal results are displayed) Labs Reviewed  URINALYSIS, COMPLETE (UACMP) WITH MICROSCOPIC - Abnormal; Notable for the following components:      Result Value   Color, Urine YELLOW (*)    APPearance CLEAR (*)    Glucose, UA 50 (*)    Hgb urine dipstick MODERATE (*)    Protein, ur 30 (*)    All other components within normal limits   BLOOD GAS, ARTERIAL - Abnormal; Notable for the following components:   pH, Arterial 7.49 (*)    pCO2 arterial 28 (*)    All other components within normal limits  RESP PANEL BY RT-PCR (FLU A&B, COVID) ARPGX2  CULTURE, BLOOD (ROUTINE X 2)  CULTURE, BLOOD (ROUTINE X 2)  URINE CULTURE  CSF CULTURE W GRAM STAIN  ANAEROBIC CULTURE W GRAM STAIN  CULTURE, FUNGUS WITHOUT SMEAR  COMPREHENSIVE METABOLIC PANEL  CBC WITH DIFFERENTIAL/PLATELET  PROTIME-INR  APTT  URINE DRUG SCREEN, QUALITATIVE ([facility] ONLY)  LACTIC ACID, PLASMA  LACTIC ACID, PLASMA  LACTATE DEHYDROGENASE  PROCALCITONIN  TECHNOLOGIST SMEAR REVIEW  HSV 1/2 PCR, CSF  CSF CELL COUNT WITH DIFFERENTIAL  BRAIN NATRIURETIC PEPTIDE  AMMONIA  PROTEIN AND GLUCOSE, CSF   EKG ED ECG REPORT I, Naaman Plummer, the attending physician, personally viewed and interpreted this ECG. Date: 10/15/2021 EKG Time: 1207 Rate: 96 Rhythm: normal sinus rhythm QRS Axis: normal Intervals: normal ST/T Wave abnormalities: normal Narrative Interpretation: no evidence of acute ischemia RADIOLOGY ED  MD interpretation: CT of the head without contrast interpreted by me shows no evidence of acute abnormalities including no intracerebral hemorrhage, obvious masses, or significant edema -Agree with radiology assessment Official radiology report(s): CT Head Wo Contrast  Result Date: 10/15/2021 CLINICAL DATA:  Mental status changes, confusion, shaking, difficulty communicating. History of chronic right MCA territory infarct EXAM: CT HEAD WITHOUT CONTRAST TECHNIQUE: Contiguous axial images were obtained from the base of the skull through the vertex without intravenous contrast. RADIATION DOSE REDUCTION: This exam was performed according to the departmental dose-optimization program which includes automated exposure control, adjustment of the mA and/or kV according to patient size and/or use of iterative reconstruction technique. COMPARISON:  10/14/2021 MR,  10/13/2021 CT FINDINGS: Brain: Remote posterior right MCA territory infarct with encephalomalacia in the right parietal and frontal lobes. Similar ex vacuo dilatation of the right lateral ventricle posteriorly. No acute intracranial hemorrhage, new mass lesion, new infarction, midline shift, herniation, hydrocephalus, or extra-axial fluid collection. No focal mass effect or edema. Cisterns are patent. No cerebellar abnormality. Vascular: No hyperdense vessel or unexpected calcification. Skull: Normal. Negative for fracture or focal lesion. Sinuses/Orbits: No acute finding. Other: None. 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 No acute intracranial abnormality or interval change by noncontrast CT. Electronically Signed   By: Jerilynn Mages.  Shick M.D.   On: 10/15/2021 10:25   DG Chest Port 1 View  Result Date: 10/15/2021 CLINICAL DATA:  Questionable sepsis - evaluate for abnormality EXAM: PORTABLE CHEST - 1 VIEW COMPARISON:  None available FINDINGS: The heart is mildly enlarged. Evaluation the mediastinum is limited due to patient obliquity. Mild patchy opacities present the right lung base likely due to atelectasis. Lungs otherwise clear. IMPRESSION: 1. Exam limited due to patient rotation. Consider repeating two view chest radiograph. 2. Mild cardiomegaly. 3. Mild right basilar atelectasis. Electronically Signed   By: Miachel Roux M.D.   On: 10/15/2021 10:02   PROCEDURES: Critical Care performed: Yes, see critical care procedure note(s) .1-3 Lead EKG Interpretation  Performed by: Naaman Plummer, MD Authorized by: Naaman Plummer, MD     Interpretation: normal     ECG rate:  96   ECG rate assessment: normal     Rhythm: sinus rhythm     Ectopy: none     Conduction: normal   CRITICAL CARE Performed by: Naaman Plummer   Total critical care time: 35 minutes  Critical care time was exclusive of separately billable procedures and treating other patients.  Critical care was necessary  to treat or prevent imminent or life-threatening deterioration.  Critical care was time spent personally by me on the following activities: development of treatment plan with patient and/or surrogate as well as nursing, discussions with consultants, evaluation of patient's response to treatment, examination of patient, obtaining history from patient or surrogate, ordering and performing treatments and interventions, ordering and review of laboratory studies, ordering and review of radiographic studies, pulse oximetry and re-evaluation of patient's condition.  MEDICATIONS ORDERED IN ED: Medications  ondansetron (ZOFRAN) injection 4 mg (has no administration in time range)  acetaminophen (TYLENOL) tablet 650 mg (has no administration in time range)  hydrALAZINE (APRESOLINE) injection 5 mg (has no administration in time range)  LORazepam (ATIVAN) tablet 1-4 mg (has no administration in time range)    Or  LORazepam (ATIVAN) injection 1-4 mg (has no administration in time range)  thiamine (VITAMIN B1) tablet 100 mg (has no administration in time range)    Or  thiamine (VITAMIN B1) injection  100 mg (has no administration in time range)  folic acid (FOLVITE) tablet 1 mg (has no administration in time range)  multivitamin with minerals tablet 1 tablet (has no administration in time range)  LORazepam (ATIVAN) injection 0-4 mg (has no administration in time range)    Followed by  LORazepam (ATIVAN) injection 0-4 mg (has no administration in time range)  insulin aspart (novoLOG) injection 0-9 Units (has no administration in time range)  insulin aspart (novoLOG) injection 0-5 Units (has no administration in time range)  lactated ringers bolus 1,000 mL (0 mLs Intravenous Stopped 10/15/21 1143)    And  lactated ringers bolus 1,000 mL (0 mLs Intravenous Stopped 10/15/21 1425)    And  lactated ringers bolus 1,000 mL (0 mLs Intravenous Stopped 10/15/21 1425)  cefTRIAXone (ROCEPHIN) 1 g in sodium chloride 0.9  % 100 mL IVPB (0 g Intravenous Stopped 10/15/21 1105)  sodium chloride 0.9 % bolus 1,000 mL (0 mLs Intravenous Stopped 10/15/21 1143)   IMPRESSION / MDM / ASSESSMENT AND PLAN / ED COURSE  I reviewed the triage vital signs and the nursing notes.                             Differential diagnosis includes, but is not limited to, CVA, DKA, meningitis, medication side effect, sepsis The patient is on the cardiac monitor to evaluate for evidence of arrhythmia and/or significant heart rate changes. Patient's presentation is most consistent with acute presentation with potential threat to life or bodily function. Patient 56 year old Caucasian male who presents for a syncopal event today as well as mild altered mental status with tachypnea.  Patient was recently discharged from our hospital for an AKI and states that the symptoms feel like they are coming back.  So for work-up on patient does show evidence of AKI that is persistent and will schedule patient for interventional radiology guided LP.  Patient CT scan does not show evidence of acute abnormalities.  Patient loaded with empiric antibiotics and blood and urine culture sent.  Dispo: Admit to medicine   FINAL CLINICAL IMPRESSION(S) / ED DIAGNOSES   Final diagnoses:  Altered mental status, unspecified altered mental status type  Syncope, unspecified syncope type  Generalized weakness   Rx / DC Orders   ED Discharge Orders     None      Note:  This document was prepared using Dragon voice recognition software and may include unintentional dictation errors.   Naaman Plummer, MD 10/15/21 [phone]

## 2021-10-15 NOTE — Procedures (Signed)
Technically successful fluoro guided LP at L4-L5 level. No opening pressure obtained.  12 cc of clear, colorless CSF sent to lab for analysis.  No immediate post procedural complication.  Please see imaging section of Epic for full dictation.    Narda Rutherford, AGNP-BC 10/15/2021, 4:34 PM

## 2021-10-15 NOTE — Assessment & Plan Note (Signed)
Pt has central abdominal tenderness and abdominal distention, etiology is not clear.  Lipase 78 CT abdomen and pelvis with following impression There is no evidence of intestinal obstruction or pneumoperitoneum. There is no hydronephrosis. Appendix is not dilated.  There is patchy infiltrate in left lower lung field suggesting atelectasis/pneumonia.  There is 1.3 cm soft tissue density structure in sigmoid colon which may suggest fecal material in the lumen or polyp. When the patient's clinical condition permits, endoscopy may be considered.  There is perinephric stranding around both kidneys which may be due to acute or chronic pyelonephritis or residual change from previous ureteric obstruction.  Small hiatal hernia.Diverticulosis of colon without signs of focal diverticulitis. Enlarged spleen. -Supportive care

## 2021-10-15 NOTE — ED Notes (Signed)
Date and time results received: 10/15/21 5:47 PM  (use smartphrase ".now" to insert current time)  Test: WBC, CSF Critical Value: 804   Name of Provider Notified: Dr. Blaine Hamper  Orders Received? Or Actions Taken?: see chart

## 2021-10-15 NOTE — Assessment & Plan Note (Signed)
  BMI= 35.94  and BW= 120.2 -Diet and exercise.   -Encourage to lose weight.

## 2021-10-15 NOTE — Assessment & Plan Note (Signed)
Blood pressure mostly within goal. -Keep holding Cozaar and amlodipine -IV hydralazine as needed

## 2021-10-15 NOTE — Consult Note (Signed)
CODE SEPSIS - PHARMACY COMMUNICATION  **Broad Spectrum Antibiotics should be administered within 1 hour of Sepsis diagnosis**  Time Code Sepsis Called/Page Received: 1018  Antibiotics Ordered: rocephin  Time of 1st antibiotic administration: 1020  Additional action taken by pharmacy: none  If necessary, Name of Provider/Nurse Contacted: n/a   Pearla Dubonnet ,PharmD Clinical Pharmacist  10/15/2021  11:13 AM

## 2021-10-15 NOTE — Assessment & Plan Note (Addendum)
CPAP.  

## 2021-10-15 NOTE — Assessment & Plan Note (Signed)
-   Continue flecainide 100 mg twice daily -Will start Eliquis tomorrow morning (patient just had LP and will hold Eliquis tonight)

## 2021-10-15 NOTE — Assessment & Plan Note (Signed)
2D echo on 10/14/2021 showed EF 55-60%. Clinically appears euvolemic. -Started on IV Lasix as above

## 2021-10-15 NOTE — Assessment & Plan Note (Addendum)
Patient meets criteria for sepsis with fever 100.6, heart rate of 108, RR 33.  Lactic acid 1.6.   Since patient has altered mental status, will need to rule out meningitis. LP is performed by IR.  Which shows leukocytosis with 75% neutrophils, elevated protein and borderline low glucose. Meningitis/encephalitis panel positive for HH V6. Fungal and PCR pending. Patient initially received broad-spectrum antibiotics with multiple agents which include cefepime followed by ceftriaxone, Augmentin, acyclovir and vancomycin. ID is on board -Stop all antibiotics. -Start him on ganciclovir per ID recommendations

## 2021-10-15 NOTE — H&P (Addendum)
History and Physical    Dustin Wells AGT:[id number] DOB: 1966/03/10 DOA: 10/15/2021  Referring MD/NP/PA:   PCP: Leonel Ramsay, MD   Patient coming from:  The patient is coming from home.  At baseline, pt is independent for most of ADL.        Chief Complaint: AMS, SOB, abdominal pain and distention, fever  HPI: Dustin Wells is a 55 y.o. male with medical history significant of hypertension, hyperlipidemia, prediabetes, GERD, gout, depression with anxiety, testicular cancer, diastolic CHF, atrial fibrillation on Eliquis, CKD-3B, alcohol abuse, OSA on CPAP, obesity with BMI 35.94, syncope, thrombocytopenia, who presents with altered mental status, shortness breath, abdominal pain and distention, fever.  Patient was recently hospitalized from 7/26-7/27 due to syncope.  Patient had MRI for brain on 7/27 which showed chronic right MCA infarction, but no acute intracranial abnormalities.  Patient had 2D echo on 7/27 which showed EF of 55-60%.  His syncope was thought possibly due to hypovolemia.  Patient was treated with IV fluid with improvement.  Pt is confused, but still oriented x 3.  Patient cannot provide accurate medical history.  He moves all extremities normally.  No facial droop or slurred speech.  Per her wife at the bedside, has been confused since this AM.  Patient complains of shortness breath and mild dry cough, but denies chest pain.  He has fever, no chills.  His temperature is 100.6 in ED.  Patient does not have nausea, vomiting, diarrhea, but he has some central abdominal pain and abdominal distention.  Per his wife, patient had incontinence of bladder or bowel movement at home.  Denies symptoms of UTI.  Patient not using oxygen normally.  Patient has oxygen desaturation to 85% on room air, with respiratory distress and tachypnea, requiring 3L oxygen with 96% saturation now  Data reviewed independently and ED Course: pt was found to have WBC 8.7, lactic acid 1.6, INR 1.5, PTT 37,  lipase 78, abnormal liver function (ALP 66, AST 78, ALT 64, total bilirubin 1.7), negative urinalysis, PCR negative, renal function has been improving from recent level, temperature 100.6, blood pressure 147/83, heart rate 108, RR 33, chest x-ray showed cardiomegaly without infiltration. CT-head showed stable remote posterior right MCA territory infarct and no acute intracranial abnormality.  Patient is admitted to PCU as inpatient.  EKG: I have personally reviewed.  Sinus rhythm, QTc 457, LAD, low voltage.   Review of Systems: Cannot be reviewed accurately due to altered mental status.   Allergy:  Allergies  Allergen Reactions   Rivaroxaban Diarrhea, Nausea Only and Other (See Comments)    Nausea, diarrhea, abdominal pain Nausea, diarrhea, abdominal pain Nausea, diarrhea, abdominal pain    Sulfamethoxazole-Trimethoprim Rash    Past Medical History:  Diagnosis Date   Atrial fibrillation (HCC)    CHF (congestive heart failure) ([place])    Hypertension    Renal disorder    Stroke [facility])    Testicular cancer [facility])     Past Surgical History:  Procedure Laterality Date   cardiac albation     CARDIAC ELECTROPHYSIOLOGY MAPPING AND ABLATION      Social History:  reports that he has never smoked. He has never used smokeless tobacco. He reports current alcohol use of about 21.0 standard drinks of alcohol per week. He reports that he does not currently use drugs.  Family History:  Family History  Problem Relation Age of Onset   Hypertension Mother    Heart disease Father      Prior to Admission  medications   Medication Sig Start Date End Date Taking? Authorizing Provider  clomiPHENE (CLOMID) 50 MG tablet Take 1/2 tablet daily Patient taking differently: Take 25 mg by mouth at bedtime. Take 1/2 tablet daily 09/29/21  Yes Hollice Espy, MD  acetaminophen (TYLENOL) 325 MG tablet Take 650 mg by mouth every 6 (six) hours as needed for mild pain.    [provider]  allopurinol  (ZYLOPRIM) 300 MG tablet Take 300 mg by mouth daily.    [provider]  amLODipine (NORVASC) 5 MG tablet Take 5 mg by mouth daily. 08/04/20   [provider]  apixaban (ELIQUIS) 5 MG TABS tablet Take 5 mg by mouth 2 (two) times daily. 03/27/19   [provider]  atorvastatin (LIPITOR) 40 MG tablet Take 40 mg by mouth daily. 02/05/19   [provider]  Cholecalciferol 25 MCG (1000 UT) tablet Take 1,000 Units by mouth daily.    [provider]  cyanocobalamin 1000 MCG tablet Take 1,000 mcg by mouth daily.    [provider]  diclofenac Sodium (VOLTAREN) 1 % GEL Apply topically. Patient not taking: Reported on 10/14/2021    [provider]  flecainide (TAMBOCOR) 100 MG tablet Take 100 mg by mouth every 12 (twelve) hours. 02/27/19   [provider]  folic acid (FOLVITE) 1 MG tablet Take by mouth. 03/12/19   [provider]  gabapentin (NEURONTIN) 300 MG capsule Take 1-3 tabs at night 01/05/21   [provider]  LORazepam (ATIVAN) 2 MG tablet Take 2 mg by mouth at bedtime as needed. 09/30/21   [provider]  losartan (COZAAR) 100 MG tablet Take 1 tablet (100 mg total) by mouth daily. Hold until you see your nephrologist 10/14/21   Lorella Nimrod, MD  magnesium oxide (MAG-OX) 400 (240 Mg) MG tablet Take 1 tablet by mouth 2 (two) times daily. 10/02/21   [provider]  metoprolol succinate (TOPROL-XL) 100 MG 24 hr tablet Take 100 mg by mouth daily. 04/30/19   [provider]  OZEMPIC, 2 MG/DOSE, 8 MG/3ML SOPN Inject 2 mg into the skin once a week. Monday 10/12/21   [provider]  pantoprazole (PROTONIX) 40 MG tablet Take 40 mg by mouth daily. 11/17/20   [provider]  sildenafil (REVATIO) 20 MG tablet Take 3-5 tablets 1 hr prior to intercourse as needed Patient taking differently: Take 60-100 mg by mouth every hour as needed (Prior to intercourse). Take 3-5 tablets 1 hr prior  to intercourse as needed 09/29/21   Hollice Espy, MD  sodium bicarbonate 650 MG tablet Take 650 mg by mouth 2 (two) times daily. 05/15/19   [provider]  torsemide (DEMADEX) 10 MG tablet Take 1 tablet (10 mg total) by mouth daily. Hold until you see your nephrologist 10/14/21   Lorella Nimrod, MD    Physical Exam: Vitals:   10/15/21 1300 10/15/21 1330 10/15/21 1500 10/15/21 1800  BP: 135/79 (!) 147/83 (!) 149/80 (!) 137/93  Pulse: 96 97 97 93  Resp: (!) 22 (!) 30 (!) 30 (!) 29  Temp:    98.1 F (36.7 C)  TempSrc:    Oral  SpO2: 100% 97% 95% 96%   General: Not in acute distress HEENT:       Eyes: PERRL, EOMI, no scleral icterus.       ENT: No discharge from the ears and nose       Neck: Difficult to assess JVD due to obesity.  No bruit, no  mass felt. Heme: No neck lymph node enlargement. Cardiac: S1/S2, RRR, No murmurs, No gallops or rubs. Respiratory: No rales, wheezing, rhonchi or rubs. GI: distended, has tenderness in central abdomen, no organomegaly, BS present. GU: No hematuria Ext: has trace leg edema bilaterally. 1+DP/PT pulse bilaterally. Musculoskeletal: No joint deformities, No joint redness or warmth, no limitation of ROM in spin. Skin: No rashes.  Neuro: Confused, still oriented X3, partially following command cranial nerves II-XII grossly intact, moves all extremities normally.  Psych: Patient is not psychotic, no suicidal or hemocidal ideation.  Labs on Admission: I have personally reviewed following labs and imaging studies  CBC: Recent Labs  Lab 10/13/21 1026 10/14/21 0652 10/15/21 0949  WBC 5.9 4.4 8.7  NEUTROABS 4.4  --  6.2  HGB 14.5 12.7* 13.5  HCT 43.7 37.4* 40.2  MCV 93.8 92.1 91.4  PLT 101* 91* 353*   Basic Metabolic Panel: Recent Labs  Lab 10/13/21 1026 10/14/21 0652 10/15/21 0949  NA 136 137 136  K 3.8 3.8 3.8  CL 99 105 102  CO2 '24 22 23  '$ GLUCOSE 138* 105* 152*  BUN 47* 45* 29*  CREATININE 4.17* 3.19* 2.67*  CALCIUM 9.2  9.2 9.4  MG 1.9  --   --    GFR: Estimated Creatinine Clearance: 42.8 mL/min (A) (by C-G formula based on SCr of 2.67 mg/dL (H)). Liver Function Tests: Recent Labs  Lab 10/13/21 1026 10/15/21 0949  AST 62* 64*  ALT 41 36  ALKPHOS 78 66  BILITOT 1.2 1.7*  PROT 7.8 7.6  ALBUMIN 3.7 3.6   Recent Labs  Lab 10/13/21 1026 10/15/21 0949  LIPASE 88* 78*   No results for input(s): "AMMONIA" in the last 168 hours. Coagulation Profile: Recent Labs  Lab 10/15/21 0949  INR 1.5*   Cardiac Enzymes: Recent Labs  Lab 10/13/21 1026 10/13/21 1233  CKTOTAL 381 303   BNP (last 3 results) No results for input(s): "PROBNP" in the last 8760 hours. HbA1C: No results for input(s): "HGBA1C" in the last 72 hours. CBG: Recent Labs  Lab 10/15/21 1659  GLUCAP 117*   Lipid Profile: No results for input(s): "CHOL", "HDL", "LDLCALC", "TRIG", "CHOLHDL", "LDLDIRECT" in the last 72 hours. Thyroid Function Tests: Recent Labs    10/13/21 1233  TSH 1.752   Anemia Panel: No results for input(s): "VITAMINB12", "FOLATE", "FERRITIN", "TIBC", "IRON", "RETICCTPCT" in the last 72 hours. Urine analysis:    Component Value Date/Time   COLORURINE YELLOW (A) 10/15/2021 1221   APPEARANCEUR CLEAR (A) 10/15/2021 1221   APPEARANCEUR Clear 09/29/2021 0912   LABSPEC 1.012 10/15/2021 1221   PHURINE 6.0 10/15/2021 1221   GLUCOSEU 50 (A) 10/15/2021 1221   HGBUR MODERATE (A) 10/15/2021 1221   BILIRUBINUR NEGATIVE 10/15/2021 1221   BILIRUBINUR Negative 09/29/2021 0912   KETONESUR NEGATIVE 10/15/2021 1221   PROTEINUR 30 (A) 10/15/2021 1221   NITRITE NEGATIVE 10/15/2021 1221   LEUKOCYTESUR NEGATIVE 10/15/2021 1221   Sepsis Labs: '@LABRCNTIP'$ (procalcitonin:4,lacticidven:4) ) Recent Results (from the past 240 hour(s))  SARS Coronavirus 2 by RT PCR (hospital order, performed in [place] hospital lab) *cepheid single result test* Urine, Clean Catch     Status: None   Collection Time: 10/13/21  4:29 PM    Specimen: Urine, Clean Catch; Nasal Swab  Result Value Ref Range Status   SARS Coronavirus 2 by RT PCR NEGATIVE NEGATIVE Final    Comment: (NOTE) SARS-CoV-2 target nucleic acids are NOT DETECTED.  The SARS-CoV-2 RNA is generally detectable in upper and lower  respiratory specimens during the acute phase of infection. The lowest concentration of SARS-CoV-2 viral copies this assay can detect is 250 copies / mL. A negative result does not preclude SARS-CoV-2 infection and should not be used as the sole basis for treatment or other patient management decisions.  A negative result may occur with improper specimen collection / handling, submission of specimen other than nasopharyngeal swab, presence of viral mutation(s) within the areas targeted by this assay, and inadequate number of viral copies (<250 copies / mL). A negative result must be combined with clinical observations, patient history, and epidemiological information.  Fact Sheet for Patients:   https://www.patel.info/  Fact Sheet for Healthcare Providers: https://hall.com/  This test is not yet approved or  cleared by the [place] FDA and has been authorized for detection and/or diagnosis of SARS-CoV-2 by FDA under an Emergency Use Authorization (EUA).  This EUA will remain in effect (meaning this test can be used) for the duration of the COVID-19 declaration under Section 564(b)(1) of the Act, 21 U.S.C. section 360bbb-3(b)(1), unless the authorization is terminated or revoked sooner.  Performed at [facility], [place],  [address]   Resp Panel by RT-PCR (Flu A&B, Covid) Anterior Nasal Swab     Status: None   Collection Time: 10/15/21 10:23 AM   Specimen: Anterior Nasal Swab  Result Value Ref Range Status   SARS Coronavirus 2 by RT PCR NEGATIVE NEGATIVE Final    Comment: (NOTE) SARS-CoV-2 target nucleic acids are NOT DETECTED.  The  SARS-CoV-2 RNA is generally detectable in upper respiratory specimens during the acute phase of infection. The lowest concentration of SARS-CoV-2 viral copies this assay can detect is 138 copies/mL. A negative result does not preclude SARS-Cov-2 infection and should not be used as the sole basis for treatment or other patient management decisions. A negative result may occur with  improper specimen collection/handling, submission of specimen other than nasopharyngeal swab, presence of viral mutation(s) within the areas targeted by this assay, and inadequate number of viral copies(<138 copies/mL). A negative result must be combined with clinical observations, patient history, and epidemiological information. The expected result is Negative.  Fact Sheet for Patients:  EntrepreneurPulse.com.au  Fact Sheet for Healthcare Providers:  IncredibleEmployment.be  This test is no t yet approved or cleared by the [place] FDA and  has been authorized for detection and/or diagnosis of SARS-CoV-2 by FDA under an Emergency Use Authorization (EUA). This EUA will remain  in effect (meaning this test can be used) for the duration of the COVID-19 declaration under Section 564(b)(1) of the Act, 21 U.S.C.section 360bbb-3(b)(1), unless the authorization is terminated  or revoked sooner.       Influenza A by PCR NEGATIVE NEGATIVE Final   Influenza B by PCR NEGATIVE NEGATIVE Final    Comment: (NOTE) The Xpert Xpress SARS-CoV-2/FLU/RSV plus assay is intended as an aid in the diagnosis of influenza from Nasopharyngeal swab specimens and should not be used as a sole basis for treatment. Nasal washings and aspirates are unacceptable for Xpert Xpress SARS-CoV-2/FLU/RSV testing.  Fact Sheet for Patients: EntrepreneurPulse.com.au  Fact Sheet for Healthcare Providers: IncredibleEmployment.be  This test is not yet approved or  cleared by the [place] FDA and has been authorized for detection and/or diagnosis of SARS-CoV-2 by FDA under an Emergency Use Authorization (EUA). This EUA will remain in effect (meaning this test can be used) for the duration of the COVID-19 declaration under Section 564(b)(1) of the Act, 21 U.S.C.  section 360bbb-3(b)(1), unless the authorization is terminated or revoked.  Performed at [facility], [place], [place] [address]   CSF culture w Gram Stain     Status: None (Preliminary result)   Collection Time: 10/15/21  4:57 PM   Specimen: CSF; Cerebrospinal Fluid  Result Value Ref Range Status   Specimen Description CSF  Final   Special Requests NONE  Final   Gram Stain   Final    WBC SEEN RED BLOOD CELLS PRESENT NO ORGANISMS SEEN Performed at [facility], [address], [place], [place] [address]    Culture PENDING  Incomplete   Report Status PENDING  Incomplete     Radiological Exams on Admission: CT ABDOMEN PELVIS WO CONTRAST  Result Date: 10/15/2021 CLINICAL DATA:  Abdominal pain EXAM: CT ABDOMEN AND PELVIS WITHOUT CONTRAST TECHNIQUE: Multidetector CT imaging of the abdomen and pelvis was performed following the standard protocol without IV contrast. RADIATION DOSE REDUCTION: This exam was performed according to the departmental dose-optimization program which includes automated exposure control, adjustment of the mA and/or kV according to patient size and/or use of iterative reconstruction technique. COMPARISON:  None Available. FINDINGS: Lower chest: Breathing motion artifacts limit evaluation. There is patchy infiltrate in left lower lung field at the left cardiophrenic angle. There are small linear densities in the lateral aspect of left lower lobe. Hepatobiliary: Breathing motion artifacts limit evaluation. As far as seen, no focal abnormalities are noted in the liver. There is no dilation of bile ducts. Gallbladder is not distended.  Pancreas: No focal abnormalities are seen. Spleen: Spleen measures 16.5 cm in AP diameter. Adrenals/Urinary Tract: Adrenals are unremarkable. There is no hydronephrosis. There is perinephric stranding around both kidneys which may be related to acute or chronic pyelonephritis or previous ureteric obstruction. There is no loculated perinephric fluid collection. There are no renal or ureteral stones. Urinary bladder is unremarkable. Stomach/Bowel: Small hiatal hernia is seen. Small bowel loops are not dilated. Appendix is not dilated. There is no significant wall thickening in colon. Diverticula are seen in the colon without signs of focal acute diverticulitis. In image 75 of series 2, there is 1.3 cm soft tissue density structure in the posterior margin of the sigmoid colon. Vascular/Lymphatic: Scattered arterial calcifications are seen. Reproductive: Unremarkable. Other: There is no ascites or pneumoperitoneum. Umbilical hernia containing fat is seen. Small right inguinal hernia containing fat is seen. Musculoskeletal: No acute findings are seen. IMPRESSION: There is no evidence of intestinal obstruction or pneumoperitoneum. There is no hydronephrosis. Appendix is not dilated. There is patchy infiltrate in left lower lung field suggesting atelectasis/pneumonia. There is 1.3 cm soft tissue density structure in sigmoid colon which may suggest fecal material in the lumen or polyp. When the patient's clinical condition permits, endoscopy may be considered. There is perinephric stranding around both kidneys which may be due to acute or chronic pyelonephritis or residual change from previous ureteric obstruction. Small hiatal hernia.Diverticulosis of colon without signs of focal diverticulitis. Enlarged spleen. Other findings as described in the body of the report. Electronically Signed   By: Elmer Picker M.D.   On: 10/15/2021 17:01   DG Lumbar Puncture Fluoro Guide  Result Date: 10/15/2021 CLINICAL DATA:   Patient presented to ED for significant weakness with fevers and altered mental status. Request for fluoroscopic guided lumbar puncture. EXAM: LUMBAR PUNCTURE UNDER FLUOROSCOPY PROCEDURE: An appropriate skin entry site was determined fluoroscopically. Operator donned sterile gloves and mask. Skin site was marked, then prepped with  Betadine, draped in usual sterile fashion, and infiltrated locally with 1% lidocaine. A 22 gauge spinal needle advanced into the thecal sac at L4-L5 from a right interlaminar approach. Clear colorless CSF spontaneously returned. 12 ml CSF were collected and divided among 4 sterile vials for the requested laboratory studies. No opening or closing pressure was obtained. The needle was then removed. The patient tolerated the procedure well and there were no complications. FLUOROSCOPY: Radiation Exposure Index (as provided by the fluoroscopic device): 32.70 mGy Kerma IMPRESSION: Technically successful lumbar puncture under fluoroscopy. This exam was performed by Narda Rutherford, NP, and was supervised and interpreted by Kathreen Devoid, MD. Electronically Signed   By: Kathreen Devoid M.D.   On: 10/15/2021 16:57   CT Head Wo Contrast  Result Date: 10/15/2021 CLINICAL DATA:  Mental status changes, confusion, shaking, difficulty communicating. History of chronic right MCA territory infarct EXAM: CT HEAD WITHOUT CONTRAST TECHNIQUE: Contiguous axial images were obtained from the base of the skull through the vertex without intravenous contrast. RADIATION DOSE REDUCTION: This exam was performed according to the departmental dose-optimization program which includes automated exposure control, adjustment of the mA and/or kV according to patient size and/or use of iterative reconstruction technique. COMPARISON:  10/14/2021 MR, 10/13/2021 CT FINDINGS: Brain: Remote posterior right MCA territory infarct with encephalomalacia in the right parietal and frontal lobes. Similar ex vacuo dilatation of the right  lateral ventricle posteriorly. No acute intracranial hemorrhage, new mass lesion, new infarction, midline shift, herniation, hydrocephalus, or extra-axial fluid collection. No focal mass effect or edema. Cisterns are patent. No cerebellar abnormality. Vascular: No hyperdense vessel or unexpected calcification. Skull: Normal. Negative for fracture or focal lesion. Sinuses/Orbits: No acute finding. Other: None. 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 No acute intracranial abnormality or interval change by noncontrast CT. Electronically Signed   By: Jerilynn Mages.  Shick M.D.   On: 10/15/2021 10:25   DG Chest Port 1 View  Result Date: 10/15/2021 CLINICAL DATA:  Questionable sepsis - evaluate for abnormality EXAM: PORTABLE CHEST - 1 VIEW COMPARISON:  None available FINDINGS: The heart is mildly enlarged. Evaluation the mediastinum is limited due to patient obliquity. Mild patchy opacities present the right lung base likely due to atelectasis. Lungs otherwise clear. IMPRESSION: 1. Exam limited due to patient rotation. Consider repeating two view chest radiograph. 2. Mild cardiomegaly. 3. Mild right basilar atelectasis. Electronically Signed   By: Miachel Roux M.D.   On: 10/15/2021 10:02   ECHOCARDIOGRAM COMPLETE  Result Date: 10/14/2021    ECHOCARDIOGRAM REPORT   Patient Name:   COLEN ELTZROTH Date of Exam: 10/14/2021 Medical Rec #:  [id number]  Height:       72.0 in Accession #:    [id number] Weight:       265.0 lb Date of Birth:  1965-11-27  BSA:          2.401 m Patient Age:    52 years   BP:           131/92 mmHg Patient Gender: M          HR:           89 bpm. Exam Location:  [facility] Procedure: 2D Echo, Cardiac Doppler and Color Doppler Indications:     Syncope R55  History:         Patient has no prior history of Echocardiogram examinations.                  CHF, Arrythmias:Atrial Fibrillation; Risk Factors:Hypertension.  Sonographer:     Sherrie Sport Referring Phys:  [id number] AMY N COX Diagnosing Phys:  Ida Rogue MD  Sonographer Comments: Technically difficult study due to poor echo windows, suboptimal parasternal window and suboptimal apical window. IMPRESSIONS  1. Left ventricular ejection fraction, by estimation, is 55 to 60%. The left ventricle has normal function. The left ventricle has no regional wall motion abnormalities. There is mild left ventricular hypertrophy. Left ventricular diastolic parameters were normal.  2. Right ventricular systolic function is normal. The right ventricular size is normal.  3. Left atrial size was mildly dilated.  4. The mitral valve was not well visualized. No evidence of mitral valve regurgitation. No evidence of mitral stenosis.  5. The aortic valve was not well visualized. Aortic valve regurgitation is not visualized. No aortic stenosis is present.  6. The inferior vena cava is normal in size with greater than 50% respiratory variability, suggesting right atrial pressure of 3 mmHg. FINDINGS  Left Ventricle: Left ventricular ejection fraction, by estimation, is 55 to 60%. The left ventricle has normal function. The left ventricle has no regional wall motion abnormalities. The left ventricular internal cavity size was normal in size. There is  mild left ventricular hypertrophy. Left ventricular diastolic parameters were normal. Right Ventricle: The right ventricular size is normal. No increase in right ventricular wall thickness. Right ventricular systolic function is normal. Left Atrium: Left atrial size was mildly dilated. Right Atrium: Right atrial size was normal in size. Pericardium: There is no evidence of pericardial effusion. Mitral Valve: The mitral valve was not well visualized. No evidence of mitral valve regurgitation. No evidence of mitral valve stenosis. Tricuspid Valve: The tricuspid valve is not well visualized. Tricuspid valve regurgitation is trivial. No evidence of tricuspid stenosis. Aortic Valve: The aortic valve was not well visualized. Aortic valve  regurgitation is not visualized. No aortic stenosis is present. Aortic valve mean gradient measures 6.0 mmHg. Aortic valve peak gradient measures 9.5 mmHg. Aortic valve area, by VTI measures 2.23 cm. Pulmonic Valve: The pulmonic valve was not well visualized. Pulmonic valve regurgitation is not visualized. No evidence of pulmonic stenosis. Aorta: The aortic root is normal in size and structure. Venous: The inferior vena cava is normal in size with greater than 50% respiratory variability, suggesting right atrial pressure of 3 mmHg. IAS/Shunts: No atrial level shunt detected by color flow Doppler.  LEFT VENTRICLE PLAX 2D LVIDd:         4.50 cm   Diastology LVIDs:         3.30 cm   LV e' medial:   6.64 cm/s LV PW:         1.70 cm   LV E/e' medial: 10.8 LV IVS:        1.50 cm LVOT diam:     2.00 cm LV SV:         51 LV SV Index:   21 LVOT Area:     3.14 cm  RIGHT VENTRICLE RV Basal diam:  4.30 cm RV S prime:     14.60 cm/s TAPSE (M-mode): 2.0 cm LEFT ATRIUM             Index        RIGHT ATRIUM           Index LA diam:        4.00 cm 1.67 cm/m   RA Area:     16.90 cm LA Vol (A2C):   81.4 ml 33.90 ml/m  RA Volume:  43.80 ml  18.24 ml/m LA Vol (A4C):   56.8 ml 23.65 ml/m LA Biplane Vol: 71.8 ml 29.90 ml/m  AORTIC VALVE AV Area (Vmax):    2.20 cm AV Area (Vmean):   2.19 cm AV Area (VTI):     2.23 cm AV Vmax:           154.00 cm/s AV Vmean:          111.000 cm/s AV VTI:            0.227 m AV Peak Grad:      9.5 mmHg AV Mean Grad:      6.0 mmHg LVOT Vmax:         108.00 cm/s LVOT Vmean:        77.500 cm/s LVOT VTI:          0.161 m LVOT/AV VTI ratio: 0.71  AORTA Ao Root diam: 3.05 cm MITRAL VALVE               TRICUSPID VALVE MV Area (PHT): 4.15 cm    TR Peak grad:   7.0 mmHg MV Decel Time: 183 msec    TR Vmax:        132.00 cm/s MV E velocity: 72.00 cm/s MV A velocity: 50.60 cm/s  SHUNTS MV E/A ratio:  1.42        Systemic VTI:  0.16 m                            Systemic Diam: 2.00 cm Ida Rogue MD  Electronically signed by Ida Rogue MD Signature Date/Time: 10/14/2021/1:53:41 PM    Final    MR BRAIN WO CONTRAST  Result Date: 10/14/2021 CLINICAL DATA:  56 year old male with altered mental status. Renal insufficiency. Administered MRI contrast yesterday for chest MRA. EXAM: MRI HEAD WITHOUT CONTRAST TECHNIQUE: Multiplanar, multiecho pulse sequences of the brain and surrounding structures were obtained without intravenous contrast. COMPARISON:  Head and cervical spine CT yesterday. Brain MRI 01/31/2020. FINDINGS: Brain: Chronic encephalomalacia in the right hemisphere, posterior right MCA territory. Chronic ex vacuo enlargement of the atrium of the right lateral ventricle. FLAIR hyperintensity in the cystic areas of encephalomalacia and adjacent sulci probably is artifact related to the recent gadolinium administration. And there is similar mild increased T1 signal there also. But stable appearance of mild underlying hemosiderin on SWI. No restricted diffusion to suggest acute infarction. No midline shift, mass effect, evidence of mass lesion, ventriculomegaly, extra-axial collection or acute intracranial hemorrhage. Cervicomedullary junction and pituitary are within normal limits. Stable gray and white matter signal elsewhere since 2021, scattered nonspecific bilateral white matter T2 and FLAIR hyperintense foci. No other cortical encephalomalacia or chronic cerebral blood products identified. Deep gray matter nuclei, brainstem and cerebellum remain within normal limits. Vascular: Major intracranial vascular flow voids are stable since 2021, dominant appearing distal right vertebral artery. Skull and upper cervical spine: Negative visible cervical spine. Visualized bone marrow signal is within normal limits. Sinuses/Orbits: Stable and negative orbits. Minimal paranasal sinus mucosal thickening or retention cysts have regressed since 2021. Other: Trace mastoid fluid has not significantly changed. Grossly  normal visible internal auditory structures otherwise. Negative visible scalp and face. IMPRESSION: 1. No acute intracranial abnormality. 2. Chronic Right MCA territory encephalomalacia. Other mild for age nonspecific white matter changes. Electronically Signed   By: Genevie Ann M.D.   On: 10/14/2021 10:52   US RENAL  Result Date: 10/14/2021 CLINICAL DATA:  Acute kidney  injury. EXAM: RENAL / URINARY TRACT ULTRASOUND COMPLETE COMPARISON:  Abdominal ultrasound 07/07/2021 FINDINGS: Right Kidney: Renal measurements: 11.2 x 5.8 x 5.4 cm = volume: 185 mL. Echogenicity within normal limits. Right renal midpole 1.2 x 0.8 x 0.5 cm simple cyst appears not significantly changed from prior when measured in a similar manner. Additional mid to lower pole 1.1 x 0.8 x 0.6 cm simple cyst. Left Kidney: Renal measurements: 11.6 x 5.5 x 5.7 cm = volume: 189 mL. Echogenicity within normal limits. Left midpole 1.1 x 1.1 x 0.7 cm benign simple cyst. Bladder: Appears normal for degree of bladder distention. Other: None. IMPRESSION: 1. No hydronephrosis within either kidney. 2. Benign bilateral renal simple cysts measuring up to 1.2 cm on the right. Electronically Signed   By: Yvonne Kendall M.D.   On: 10/14/2021 10:02      Assessment/Plan Principal Problem:   Acute metabolic encephalopathy Active Problems:   Sepsis ([place])   Acute respiratory failure with hypoxia (HCC)   Chronic diastolic CHF (congestive heart failure) (HCC)   Alcohol use disorder, severe, dependence (HCC)   Atrial fibrillation, chronic (HCC)   HTN (hypertension)   Stage 3b chronic kidney disease (CKD) ([place])   Testicular cancer ([place])   Depression with anxiety   HLD (hyperlipidemia)   Thrombocytopenia (HCC)   OSA (obstructive sleep apnea)   Obesity with body mass index of 30.0-39.9   Abnormal LFTs   Abdominal distention   Assessment and Plan: * Acute metabolic encephalopathy Etiology is not clear.  No focal neurologic deficit on physical examination.   CT head showed old infarction, no acute intracranial issue.  Possibly due to SIRS and possible ongoing infection.  -admit to PCU as inpt -Frequent neurochecks -Broad antibiotics for possible infection as below  Sepsis [facility]) Patient meets criteria for SIRS with fever 100.6, heart rate of 108, RR 33.  Lactic acid 1.6.  No clear source of infection identified, will treat patient as SIRS now.  If source of infection is identified, will change to sepsis. UA negative.  Chest x-ray negative for infiltration.  Since patient has altered mental status, will need to rule out meningitis. LP is performed by IR.  - start broad antibiotics: Vancomycin, cefepime, ampicillin, acyclovir (patient received 1 dose of Rocephin in ED) -Follow-up CSF analysis -Follow-up blood culture and urine culture -Check procalcitonin level and trend lactic acid level -IV fluid: Patient received 3 L LR and 1 L normal saline in ED  Addendum:  initial CSF analysis showed WBC 804, protein 103, glucose 54, indicating possible meningitis -Will change diagnosis from SIRS to sepsis -Consulted Dr. Drucilla Schmidt of ID, who recommended to switch cefepime to Rocephin 2 g twice daily.  He also recommended to check CSF syphilis and cryptococcal antigen which was ordered          Acute respiratory failure with hypoxia (HCC) Etiology is not clear.  Chest x-ray negative for effusion.  COVID PCR negative.  Patient is taking Eliquis, less likely to have PE.  Patient has history of diastolic CHF, but due to obesity, it is very difficult to assess volume status.  Patient may have CHF exacerbation, in addition patient received total of 4L of IV fluid bolus in ED -will check BNP -start Lasix 40 mg bid -Bronchodilators  Chronic diastolic CHF (congestive heart failure) ([place]) 2D echo on 10/14/2021 showed EF 55-60%. -Started on IV Lasix as above  Alcohol use disorder, severe, dependence ([place]) - CIWA protocol  Atrial fibrillation, chronic (HCC) -  Continue flecainide  100 mg twice daily -Will start Eliquis tomorrow morning (patient just had LP and will hold Eliquis tonight)  HTN (hypertension) -hold Cozaar and amlodipine since patient is at high risk of developing hypotension due to SIRS -IV hydralazine as needed  Stage 3b chronic kidney disease (CKD) ([place]) Patient had worsening kidney function in recent admission, creatinine was up to 4.17 on 10/13/2021, which improved to 3.19 at discharge.  Today his creatinine is at 2.67, BUN 29, improving. -Follow-up renal function by BMP  Testicular cancer ([place]) -on clomiphene  Depression with anxiety - Continue home as needed Ativan which also help alcohol withdrawal  HLD (hyperlipidemia) - Lipitor  Thrombocytopenia ([place]) This is a chronic issue.  Platelets are 128 (91 on 10/14/2021), likely due to alcohol abuse.  No active bleeding. -Follow-up by CBC  OSA (obstructive sleep apnea) -CPAP  Obesity with body mass index of 30.0-39.9  BMI= 35.94  and BW= 120.2 -Diet and exercise.   -Encourage to lose weight.   Abdominal distention Pt has central abdominal tenderness and abdominal distention, etiology is not clear.  Lipase 78 -Follow-up CT of abdomen/pelvis  Abnormal LFTs Patient has mild abnormal liver function with AST 78, ALT 64, total bilirubin 1.7, ALP 66.  Likely due to alcohol abuse and ongoing infection. -Judicious use Tylenol (patient cannot use NSAID for fever due to CKD)          DVT ppx: SCD now and start Eliquis tomorrow (pt just had LP today)  Code Status: Full code  Family Communication:  Yes, patient's wife  at bed side.   Disposition Plan:  Anticipate discharge back to previous environment  Consults called:  none  Admission status and Level of care: Progressive:   as inpt        Severity of Illness:  The appropriate patient status for this patient is INPATIENT. Inpatient status is judged to be reasonable and necessary in order to provide the  required intensity of service to ensure the patient's safety. The patient's presenting symptoms, physical exam findings, and initial radiographic and laboratory data in the context of their chronic comorbidities is felt to place them at high risk for further clinical deterioration. Furthermore, it is not anticipated that the patient will be medically stable for discharge from the hospital within 2 midnights of admission.   * I certify that at the point of admission it is my clinical judgment that the patient will require inpatient hospital care spanning beyond 2 midnights from the point of admission due to high intensity of service, high risk for further deterioration and high frequency of surveillance required.*       Date of Service 10/15/2021    Ivor Costa [facility]   If 7PM-7AM, please contact night-coverage [email or web address] 10/15/2021, 6:12 PM

## 2021-10-15 NOTE — Assessment & Plan Note (Signed)
-   Continue home as needed Ativan which also help alcohol withdrawal

## 2021-10-15 NOTE — Assessment & Plan Note (Signed)
Improving. 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  -Continue to monitor -Infection management as below

## 2021-10-15 NOTE — Progress Notes (Signed)
Pharmacy Antibiotic Note  Dustin Wells is a 56 y.o. male admitted on 10/15/2021 with sepsis and meningitis.  Pharmacy has been consulted for Vancomycin, Cefepime, Unasyn, and Acyclovir dosing.  Plan: Vancomycin '2500mg'$  IV loading dose followed by Vancomycin '1250mg'$  IV q24h per nomogram. Since patient is possible menigitis will need a goal trough of 15-11mg/ml. Will check a trough prior to 3rd dose.  Cefepime 2g IV q12h  Acyclovir '10mg'$ /kg (use ABW for dose) IV q12h. Follow renal function closely, maintain adequate hydration.  Unasyn 3g IV q6h    Temp (24hrs), Avg:98.8 F (37.1 C), Min:98.3 F (36.8 C), Max:99.3 F (37.4 C)  Recent Labs  Lab 10/13/21 1026 10/13/21 1427 10/13/21 1629 10/14/21 0652 10/15/21 0946 10/15/21 0949  WBC 5.9  --   --  4.4  --  8.7  CREATININE 4.17*  --   --  3.19*  --  2.67*  LATICACIDVEN  --  2.3* 1.1  --  1.6  --     Estimated Creatinine Clearance: 42.8 mL/min (A) (by C-G formula based on SCr of 2.67 mg/dL (H)).    Allergies  Allergen Reactions   Rivaroxaban Diarrhea, Nausea Only and Other (See Comments)    Nausea, diarrhea, abdominal pain Nausea, diarrhea, abdominal pain Nausea, diarrhea, abdominal pain    Sulfamethoxazole-Trimethoprim Rash    Antimicrobials this admission: Vancomycin 7/28 >>  Cefepime 7/28 >> Unasyn 7/28 >>  Acyclovir 7/28 >>  Dose adjustments this admission:  Microbiology results: 7/28 BCx: pending 7/28 UCx: pending  7/28 CSF: pending   Thank you for allowing pharmacy to be a part of this patient's care.  LPaulina Fusi PharmD, BCPS 10/15/2021 4:51 PM

## 2021-10-15 NOTE — Assessment & Plan Note (Signed)
This is a chronic issue.  Platelets are 129 (91 on 10/14/2021), likely due to alcohol abuse.  No active bleeding. -Follow-up by CBC

## 2021-10-15 NOTE — Assessment & Plan Note (Signed)
Lipitor 

## 2021-10-16 ENCOUNTER — Inpatient Hospital Stay: Payer: 59

## 2021-10-16 ENCOUNTER — Other Ambulatory Visit: Payer: Self-pay | Admitting: Infectious Disease

## 2021-10-16 DIAGNOSIS — F1020 Alcohol dependence, uncomplicated: Secondary | ICD-10-CM

## 2021-10-16 DIAGNOSIS — G0490 Encephalitis and encephalomyelitis, unspecified: Secondary | ICD-10-CM | POA: Diagnosis not present

## 2021-10-16 DIAGNOSIS — G9341 Metabolic encephalopathy: Secondary | ICD-10-CM | POA: Diagnosis not present

## 2021-10-16 LAB — URINE CULTURE: Culture: NO GROWTH

## 2021-10-16 LAB — CBC
HCT: 36.9 % — ABNORMAL LOW (ref 39.0–52.0)
Hemoglobin: 12.8 g/dL — ABNORMAL LOW (ref 13.0–17.0)
MCH: 31.7 pg (ref 26.0–34.0)
MCHC: 34.7 g/dL (ref 30.0–36.0)
MCV: 91.3 fL (ref 80.0–100.0)
Platelets: 129 10*3/uL — ABNORMAL LOW (ref 150–400)
RBC: 4.04 MIL/uL — ABNORMAL LOW (ref 4.22–5.81)
RDW: 13.8 % (ref 11.5–15.5)
WBC: 10.9 10*3/uL — ABNORMAL HIGH (ref 4.0–10.5)
nRBC: 0 % (ref 0.0–0.2)

## 2021-10-16 LAB — MENINGITIS/ENCEPHALITIS PANEL (CSF)
Cryptococcus neoformans/gattii (CSF): NOT DETECTED
Cytomegalovirus (CSF): NOT DETECTED
Enterovirus (CSF): NOT DETECTED
Escherichia coli K1 (CSF): NOT DETECTED
Haemophilus influenzae (CSF): NOT DETECTED
Herpes simplex virus 1 (CSF): NOT DETECTED
Herpes simplex virus 2 (CSF): NOT DETECTED
Human herpesvirus 6 (CSF): DETECTED — AB
Human parechovirus (CSF): NOT DETECTED
Listeria monocytogenes (CSF): NOT DETECTED
Neisseria meningitis (CSF): NOT DETECTED
Streptococcus agalactiae (CSF): NOT DETECTED
Streptococcus pneumoniae (CSF): NOT DETECTED
Varicella zoster virus (CSF): NOT DETECTED

## 2021-10-16 LAB — GLUCOSE, CAPILLARY
Glucose-Capillary: 172 mg/dL — ABNORMAL HIGH (ref 70–99)
Glucose-Capillary: 249 mg/dL — ABNORMAL HIGH (ref 70–99)
Glucose-Capillary: 179 mg/dL — ABNORMAL HIGH (ref 70–99)
Glucose-Capillary: 177 mg/dL — ABNORMAL HIGH (ref 70–99)

## 2021-10-16 LAB — BASIC METABOLIC PANEL
Anion gap: 14 (ref 5–15)
BUN: 29 mg/dL — ABNORMAL HIGH (ref 6–20)
CO2: 21 mmol/L — ABNORMAL LOW (ref 22–32)
Calcium: 8.5 mg/dL — ABNORMAL LOW (ref 8.9–10.3)
Chloride: 102 mmol/L (ref 98–111)
Creatinine, Ser: 2.09 mg/dL — ABNORMAL HIGH (ref 0.61–1.24)
GFR, Estimated: 37 mL/min — ABNORMAL LOW (ref >60)
Glucose, Bld: 181 mg/dL — ABNORMAL HIGH (ref 70–99)
Potassium: 3.7 mmol/L (ref 3.5–5.1)
Sodium: 137 mmol/L (ref 135–145)

## 2021-10-16 MED ORDER — SODIUM CHLORIDE 0.9 % IV SOLN
600.0000 mg | Freq: Two times a day (BID) | INTRAVENOUS | Status: DC
  Administered 2021-10-16 – 2021-10-18 (×4): 600 mg via INTRAVENOUS
  Filled 2021-10-16 (×5): qty 20

## 2021-10-16 MED ORDER — GADOBUTROL 1 MMOL/ML IV SOLN
10.0000 mL | Freq: Once | INTRAVENOUS | Status: AC | PRN
  Administered 2021-10-16: 10 mL via INTRAVENOUS

## 2021-10-16 MED ORDER — SODIUM CHLORIDE 0.9 % IV SOLN
5.0000 mg/kg | Freq: Two times a day (BID) | INTRAVENOUS | Status: DC
  Administered 2021-10-16 – 2021-10-18 (×4): 600 mg via INTRAVENOUS
  Filled 2021-10-16 (×5): qty 12

## 2021-10-16 NOTE — Hospital Course (Addendum)
Taken from H&P.  Dustin Wells is a 56 y.o. male with medical history significant of hypertension, hyperlipidemia, prediabetes, GERD, gout, depression with anxiety, testicular cancer, diastolic CHF, atrial fibrillation on Eliquis, CKD-3B, alcohol abuse, OSA on CPAP, obesity with BMI 35.94, syncope, thrombocytopenia, who presents with altered mental status, shortness breath, abdominal pain and distention, fever.   Patient was recently hospitalized from 7/26-7/27 due to syncope.  Patient had MRI for brain on 7/27 which showed chronic right MCA infarction, but no acute intracranial abnormalities.  Patient had 2D echo on 7/27 which showed EF of 55-60%.  His syncope was thought possibly due to hypovolemia.  Patient was treated with IV fluid with improvement.   Pt is confused, but still oriented x 3.  Patient cannot provide accurate medical history.  He moves all extremities normally.  No facial droop or slurred speech.  Per her wife at the bedside, has been confused since this AM.  Patient complains of shortness breath and mild dry cough, but denies chest pain.  He has fever, no chills.  His temperature is 100.6 in ED.  Patient does not have nausea, vomiting, diarrhea, but he has some central abdominal pain and abdominal distention.  Per his wife, patient had incontinence of bladder or bowel movement at home.  Denies symptoms of UTI.   Patient not using oxygen normally.  Patient has oxygen desaturation to 85% on room air, with respiratory distress and tachypnea, requiring 3L oxygen with 96% saturation now   Data reviewed independently and ED Course: pt was found to have WBC 8.7, lactic acid 1.6, INR 1.5, PTT 37, lipase 78, abnormal liver function (ALP 66, AST 78, ALT 64, total bilirubin 1.7), negative urinalysis, PCR negative, renal function has been improving from recent level, temperature 100.6, blood pressure 147/83, heart rate 108, RR 33, chest x-ray showed cardiomegaly without infiltration. CT-head showed  stable remote posterior right MCA territory infarct and no acute intracranial abnormality.  EKG:  Sinus rhythm, QTc 457, LAD, low voltage.  7/29: LP was done for concern of meningitis and it shows elevated leukocytosis at 804, protein of 103 and glucose of 54.  Met sepsis criteria, ID was consulted.  Antibiotics switched to Rocephin 2 g twice daily.  ID also recommended to check CSF syphilis and cryptococcal antigen.  He is also on Unasyn, vancomycin and acyclovir. Cryptococcal antigen negative, procalcitonin at 1.12, renal function with some improvement as compared to recent hospitalization, creatinine at 2.09 which is close to his baseline.  Preliminary CSF cultures negative.  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 HSV and VDRL pending.  Fungal cultures pending. ID advised to stop all the antibiotics and start him on ganciclovir. HIV labs are pending as HHV-6 encephalitis is more common in immunocompromised.

## 2021-10-16 NOTE — Progress Notes (Addendum)
       Date: 10/16/2021  Patient name: Dustin Wells  Medical record number: [id number]  Date of birth: 01/02/1966    Mr. Schulenburg is a 56 year old man with a past medical history significant for hypertension hyperlipidemia he GERD gout depression anxiety testicular cancer diastolic heart failure atrial fibrillation alcohol abuse who had been recently admitted with syncope and worked up and ultimately discharged.  He is now readmitted with altered mental status and shortness of breath and abdominal pain distention and fever.  He underwent lumbar puncture which yielded 12 mL of CSF that was collected.  Opening and closing pressures were not documented CSF profile showed  804 white blood cells with 75% PMNs 19% lymphocytes protein of 103 and glucose of 54.  He was started on broad-spectrum antibiotics in the form of vancomycin cefepime ampicillin and acyclovir.  I recommended changing his cefepime to ceftriaxone 2 g IV every 12 hours.  I had not realized that the meningitis profile was available to be added to his CSF and I did so this morning angiitis profile came back positive for HHV-6.  I am stopping his other antibiotics and initiating therapy with IV ganciclovir.  HHV-6 encephalitis is more common in immunocompromised patients and he may not be altogether immunocompetent.  Regardless given his presentation I felt he warranted IV ganciclovir.  ADDENDUM:  I discussed case with Ricky Stabs, MD with [facility]. She was VERY skeptical of HHV 6 being the culprit  She recommended sending HHV 6 quant PCR to [facility] but not clear our lab will be allowed to do this  She though could be false + HSV PCR --so at least we are running that  I will add back antibacterial abx since NOT every bacterial organism is seen on meningitis panel.   I will use  Teflaro while GCV is running since vanco and GCV would have additive nephrotoxicity.  I will order MRI of brain with contrast  I provided  more than an hour of work researching this patient, conferring with Dr Frances Maywood ([facility]) Dr. Reesa Chew, [facility], pharmacy.   Rhina Brackett Dam 10/16/2021, 1:14 PM

## 2021-10-16 NOTE — Progress Notes (Signed)
PHARMACY - PHYSICIAN COMMUNICATION  CRITICAL VALUE ALERT - Meningitis / Encephalitis Panel  Dustin Wells is an 56 y.o. male who presented to [facility] on 10/15/2021 with a chief complaint of AMS, SOB, abdominal pain and distention, and fever  Assessment: Lumbar puncture performed for concerns of meningitis / encephalitis.   Name of physician (or Provider) Contacted: Lorella Nimrod and Alcide Evener  Current anti-infectives: Acyclovir Changes to prescribed anti-infectives recommended:  CSF contained HHV6. Changed from acyclovir to ganciclovir. ID physician aware. All other antibiotics stopped.   Results for orders placed or performed during the hospital encounter of 10/15/21  Meningitis/Encephalitis Panel (CSF) (Collected: 10/15/2021  8:02 PM)  Result Value Ref Range   Cryptococcus neoformans/gattii (CSF) NOT DETECTED NOT DETECTED   Cytomegalovirus (CSF) NOT DETECTED NOT DETECTED   Enterovirus (CSF) NOT DETECTED NOT DETECTED   Escherichia coli K1 (CSF) NOT DETECTED NOT DETECTED   Haemophilus influenzae (CSF) NOT DETECTED NOT DETECTED   Herpes simplex virus 1 (CSF) NOT DETECTED NOT DETECTED   Herpes simplex virus 2 (CSF) NOT DETECTED NOT DETECTED   Human herpesvirus 6 (CSF) DETECTED (A) NOT DETECTED   Human parechovirus (CSF) NOT DETECTED NOT DETECTED   Listeria monocytogenes (CSF) NOT DETECTED NOT DETECTED   Neisseria meningitis (CSF) NOT DETECTED NOT DETECTED   Streptococcus agalactiae (CSF) NOT DETECTED NOT DETECTED   Streptococcus pneumoniae (CSF) NOT DETECTED NOT DETECTED   Varicella zoster virus (CSF) NOT DETECTED NOT DETECTED    Dustin Wells 10/16/2021  12:42 PM

## 2021-10-16 NOTE — Progress Notes (Signed)
Progress Note   Patient: Dustin Wells BLT:[id number] DOB: [birth year] DOA: 10/15/2021     1 DOS: the patient was seen and examined on 10/16/2021   Brief hospital course: Taken from H&P.  Bernerd Terhune is a 56 y.o. male with medical history significant of hypertension, hyperlipidemia, prediabetes, GERD, gout, depression with anxiety, testicular cancer, diastolic CHF, atrial fibrillation on Eliquis, CKD-3B, alcohol abuse, OSA on CPAP, obesity with BMI 35.94, syncope, thrombocytopenia, who presents with altered mental status, shortness breath, abdominal pain and distention, fever.   Patient was recently hospitalized from 7/26-7/27 due to syncope.  Patient had MRI for brain on 7/27 which showed chronic right MCA infarction, but no acute intracranial abnormalities.  Patient had 2D echo on 7/27 which showed EF of 55-60%.  His syncope was thought possibly due to hypovolemia.  Patient was treated with IV fluid with improvement.   Pt is confused, but still oriented x 3.  Patient cannot provide accurate medical history.  He moves all extremities normally.  No facial droop or slurred speech.  Per her wife at the bedside, has been confused since this AM.  Patient complains of shortness breath and mild dry cough, but denies chest pain.  He has fever, no chills.  His temperature is 100.6 in ED.  Patient does not have nausea, vomiting, diarrhea, but he has some central abdominal pain and abdominal distention.  Per his wife, patient had incontinence of bladder or bowel movement at home.  Denies symptoms of UTI.   Patient not using oxygen normally.  Patient has oxygen desaturation to 85% on room air, with respiratory distress and tachypnea, requiring 3L oxygen with 96% saturation now   Data reviewed independently and ED Course: pt was found to have WBC 8.7, lactic acid 1.6, INR 1.5, PTT 37, lipase 78, abnormal liver function (ALP 66, AST 78, ALT 64, total bilirubin 1.7), negative urinalysis, PCR negative, renal function has  been improving from recent level, temperature 100.6, blood pressure 147/83, heart rate 108, RR 33, chest x-ray showed cardiomegaly without infiltration. CT-head showed stable remote posterior right MCA territory infarct and no acute intracranial abnormality.  EKG:  Sinus rhythm, QTc 457, LAD, low voltage.  7/29: LP was done for concern of meningitis and it shows elevated leukocytosis at 804, protein of 103 and glucose of 54.  Met sepsis criteria, ID was consulted.  Antibiotics switched to Rocephin 2 g twice daily.  ID also recommended to check CSF syphilis and cryptococcal antigen.  He is also on Unasyn, vancomycin and acyclovir. Cryptococcal antigen negative, procalcitonin at 1.12, renal function with some improvement as compared to recent hospitalization, creatinine at 2.09 which is close to his baseline.  Preliminary CSF cultures negative.  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 HSV and VDRL pending.  Fungal cultures pending. ID advised to stop all the antibiotics and start him on ganciclovir. HIV labs are pending as HHV-6 encephalitis is more common in immunocompromised.     Assessment and Plan: * Acute metabolic encephalopathy Improving. 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  -Continue to monitor -Infection management as below  Sepsis [facility]) Patient meets criteria for sepsis with fever 100.6, heart rate of 108, RR 33.  Lactic acid 1.6.   Since patient has altered mental status, will need to rule out meningitis. LP is performed by IR.  Which shows leukocytosis with 75% neutrophils, elevated protein and borderline low glucose. Meningitis/encephalitis panel positive for  HH V6. Fungal and PCR pending. Patient initially received broad-spectrum antibiotics with multiple agents which include cefepime followed by ceftriaxone, Augmentin, acyclovir and  vancomycin. ID is on board -Stop all antibiotics. -Start him on ganciclovir per ID recommendations           Acute respiratory failure with hypoxia (HCC) Resolved.  Patient is now on room air. Etiology is not clear.  May be secondary to sepsis and encephalitis.  Chest x-ray negative for effusion.  COVID PCR negative.  Patient is taking Eliquis, less likely to have PE.  Patient has history of diastolic CHF, but due to obesity, it is very difficult to assess volume status.  BNP at 256.  Patient did received IV fluid per sepsis protocol. -Continue Lasix 40 mg bid -Bronchodilators  Chronic diastolic CHF (congestive heart failure) ([place]) 2D echo on 10/14/2021 showed EF 55-60%. Clinically appears euvolemic. -Started on IV Lasix as above  Alcohol use disorder, severe, dependence (HCC) - CIWA protocol  Atrial fibrillation, chronic (HCC) - Continue flecainide 100 mg twice daily -Restarted Eliquis-initially held for LP  HTN (hypertension) Blood pressure mostly within goal. -Keep holding Cozaar and amlodipine -IV hydralazine as needed  Stage 3b chronic kidney disease (CKD) ([place]) Improving with creatinine at 2.09 which is very close to his baseline. Patient had worsening kidney function in recent admission, creatinine was up to 4.17 on 10/13/2021, which improved to 3.19 at discharge.  Today his creatinine is at 2.67, BUN 29, improving. -Follow-up renal function by BMP  Testicular cancer ([place]) -on clomiphene  Depression with anxiety - Continue home as needed Ativan which also help alcohol withdrawal  HLD (hyperlipidemia) - Lipitor  Thrombocytopenia ([place]) This is a chronic issue.  Platelets are 129 (91 on 10/14/2021), likely due to alcohol abuse.  No active bleeding. -Follow-up by CBC  OSA (obstructive sleep apnea) -CPAP at night  Obesity with body mass index of 30.0-39.9  BMI= 35.94  and BW= 120.2 -Diet and exercise.   -Encourage to lose weight.   Abdominal  distention Pt has central abdominal tenderness and abdominal distention, etiology is not clear.  Lipase 78 CT abdomen and pelvis with following impression There is no evidence of intestinal obstruction or pneumoperitoneum. There is no hydronephrosis. Appendix is not dilated.  There is patchy infiltrate in left lower lung field suggesting atelectasis/pneumonia.  There is 1.3 cm soft tissue density structure in sigmoid colon which may suggest fecal material in the lumen or polyp. When the patient's clinical condition permits, endoscopy may be considered.  There is perinephric stranding around both kidneys which may be due to acute or chronic pyelonephritis or residual change from previous ureteric obstruction.  Small hiatal hernia.Diverticulosis of colon without signs of focal diverticulitis. Enlarged spleen. -Supportive care  Abnormal LFTs Patient has mild abnormal liver function with AST 78, ALT 64, total bilirubin 1.7, ALP 66.  Likely due to alcohol abuse and ongoing infection. -Judicious use Tylenol (patient cannot use NSAID for fever due to CKD)   Subjective: Patient was feeling little improved.  Continues to feel weak.  Denies any headache, neck pain or blurry vision.  Wife at bedside.  Mentation with significant improvement but still little off per wife.  Physical Exam: Vitals:   10/15/21 2341 10/16/21 0452 10/16/21 0741 10/16/21 1136  BP: 125/82 124/80 108/71 103/70  Pulse: 87 80 74 81  Resp: 20 18 19 19   Temp: 98.5 F (36.9 C) 99 F (37.2 C) 98.1 F (36.7 C) 98 F (36.7 C)  TempSrc:  SpO2: 95% 96% 97% 95%   General.  Obese gentleman, in no acute distress. Pulmonary.  Lungs clear bilaterally, normal respiratory effort. CV.  Regular rate and rhythm, no JVD, rub or murmur. Abdomen.  Soft, nontender, nondistended, BS positive. CNS.  Alert and oriented .  No focal neurologic deficit. Extremities.  No edema, no cyanosis, pulses intact and  symmetrical. Psychiatry.  Judgment and insight appears normal.  Data Reviewed: Prior data, notes, labs and images reviewed  Family Communication: Discussed with wife at bedside.  Disposition: Status is: Inpatient Remains inpatient appropriate because: Severity of illness   Planned Discharge Destination: Home  DVT prophylaxis.  Eliquis Time spent: 50 minutes  This record has been created using Systems analyst. Errors have been sought and corrected,but may not always be located. Such creation errors do not reflect on the standard of care.  Author: Lorella Nimrod, MD 10/16/2021 1:42 PM  For on call review [email or web address].

## 2021-10-17 DIAGNOSIS — G0490 Encephalitis and encephalomyelitis, unspecified: Secondary | ICD-10-CM | POA: Diagnosis present

## 2021-10-17 DIAGNOSIS — G9340 Encephalopathy, unspecified: Secondary | ICD-10-CM | POA: Diagnosis present

## 2021-10-17 DIAGNOSIS — G9341 Metabolic encephalopathy: Secondary | ICD-10-CM | POA: Diagnosis not present

## 2021-10-17 LAB — BASIC METABOLIC PANEL
Anion gap: 12 (ref 5–15)
BUN: 41 mg/dL — ABNORMAL HIGH (ref 6–20)
CO2: 25 mmol/L (ref 22–32)
Calcium: 8.8 mg/dL — ABNORMAL LOW (ref 8.9–10.3)
Chloride: 97 mmol/L — ABNORMAL LOW (ref 98–111)
Creatinine, Ser: 1.95 mg/dL — ABNORMAL HIGH (ref 0.61–1.24)
GFR, Estimated: 40 mL/min — ABNORMAL LOW (ref >60)
Glucose, Bld: 195 mg/dL — ABNORMAL HIGH (ref 70–99)
Potassium: 3.4 mmol/L — ABNORMAL LOW (ref 3.5–5.1)
Sodium: 134 mmol/L — ABNORMAL LOW (ref 135–145)

## 2021-10-17 LAB — GLUCOSE, CAPILLARY
Glucose-Capillary: 154 mg/dL — ABNORMAL HIGH (ref 70–99)
Glucose-Capillary: 176 mg/dL — ABNORMAL HIGH (ref 70–99)
Glucose-Capillary: 152 mg/dL — ABNORMAL HIGH (ref 70–99)
Glucose-Capillary: 147 mg/dL — ABNORMAL HIGH (ref 70–99)

## 2021-10-17 LAB — HSV 1/2 PCR, CSF
HSV-1 DNA: NEGATIVE
HSV-2 DNA: NEGATIVE

## 2021-10-17 NOTE — Assessment & Plan Note (Signed)
This is a chronic issue.  Platelets are 129 (91 on 10/14/2021), likely due to alcohol abuse.  No active bleeding. -Follow-up by CBC

## 2021-10-17 NOTE — Assessment & Plan Note (Signed)
Resolved.  Patient is now on room air. Etiology is not clear.  May be secondary to sepsis and encephalitis.  Chest x-ray negative for effusion.  COVID PCR negative.  Patient is taking Eliquis, less likely to have PE.  Patient has history of diastolic CHF, but due to obesity, it is very difficult to assess volume status.  BNP at 256.  Patient did received IV fluid per sepsis protocol. He was given some IV Lasix yesterday-appears euvolemic with slight worsening of his anemia.  Creatinine improving. -Stop IV diuresis -Bronchodilators

## 2021-10-17 NOTE — Assessment & Plan Note (Signed)
Secondary to HHV-6. Mental status improved Please see below for further information and management.

## 2021-10-17 NOTE — Assessment & Plan Note (Signed)
Blood pressure mostly within goal. -Keep holding Cozaar and amlodipine -IV hydralazine as needed

## 2021-10-17 NOTE — Evaluation (Signed)
Physical Therapy Evaluation Patient Details Name: Dustin Wells MRN: [id number] DOB: 1965-04-09 Today's Date: 10/17/2021  History of Present Illness  Pt is a 56 y/o M admitted on 10/15/21 after presenting with c/o AMS, SOB, abdominal pain & distension & fever. Pt was recently hospitalized from 7/26-7/27 2/2 syncope & MRI showed chronic R MCA infarction & syncope was thought to be due to hypovolemia. 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 PMH: HTN, HLD, prediabetes, GERD, gout, depression with anxiety, testicular CA, dCHF, a-fib on eliquis, CKD3B, alcohol abuse, OSA on CPAP, obesity, syncope, thrombocytopenia  Clinical Impression  Pt seen for PT evaluation with wife present for session. Prior to admission pt was independent without AD, working part-time at [employer]. On this date, pt is AxOx3, which wife reports is baseline. Pt completes bed mobility without physical assistance but extra time, STS with min assist, & ambulates with impaired gait pattern as noted below around bed to recliner without AD. Provided pt with RW & educated pt on need to ambulate within base of AD with pt able to ambulate into hallway with CGA fade to close supervision. Pt's wife works from home & reports she can assist pt at home. Recommend HHPT f/u but will continue to follow pt acutely to address balance, gait with LRAD, and stair negotiation.    Recommendations for follow up therapy are one component of a multi-disciplinary discharge planning process, led by the attending physician.  Recommendations may be updated based on patient status, additional functional criteria and insurance authorization.  Follow Up Recommendations Home health PT      Assistance Recommended at Discharge Frequent or constant Supervision/Assistance  Patient can return home with the following  A little help with walking and/or transfers;A little help with bathing/dressing/bathroom;Assistance  with cooking/housework;Assist for transportation;Help with stairs or ramp for entrance;Direct supervision/assist for medications management    Equipment Recommendations Rolling walker (2 wheels)  Recommendations for Other Services  OT consult    Functional Status Assessment Patient has had a recent decline in their functional status and demonstrates the ability to make significant improvements in function in a reasonable and predictable amount of time.     Precautions / Restrictions Precautions Precautions: Fall Restrictions Weight Bearing Restrictions: No      Mobility  Bed Mobility Overal bed mobility: Needs Assistance Bed Mobility: Supine to Sit     Supine to sit: Supervision, HOB elevated     General bed mobility comments: extra time & use of BUE to move BLE to EOB    Transfers Overall transfer level: Needs assistance Equipment used: None Transfers: Sit to/from Stand Sit to Stand: Min assist           General transfer comment: strong posterior lean on EOB with BLE    Ambulation/Gait Ambulation/Gait assistance: Min assist Gait Distance (Feet): 10 Feet Assistive device: None (intermittently reaching for 1UE support when ambulating around bed to recliner) Gait Pattern/deviations: Decreased step length - right, Decreased step length - left, Decreased dorsiflexion - right, Decreased dorsiflexion - left, Decreased stride length, Narrow base of support Gait velocity: significantly decreased but pt & wife reports pt ambulates very slowly at baseline     General Gait Details: decreased heel strike, decreased foot clearance, decreased reciprocal arm swing  Stairs            Wheelchair Mobility    Modified Rankin (Stroke Patients Only)       Balance Overall balance assessment: Needs assistance Sitting-balance support: Feet  supported, Bilateral upper extremity supported Sitting balance-Leahy Scale: Good     Standing balance support: During functional  activity, No upper extremity supported Standing balance-Leahy Scale: Poor                               Pertinent Vitals/Pain Pain Assessment Pain Assessment: No/denies pain    Home Living Family/patient expects to be discharged to:: Private residence Living Arrangements: Spouse/significant other Available Help at Discharge: Family;Available 24 hours/day (wife works from home) Type of Home: House Home Access: Stairs to enter [employer]: None (at front entrance (prefers front entrance over back entrance with rails as back is steep)) Entrance Stairs-Number of Steps: 1-2   Home Layout: One [place]: None      Prior Function Prior Level of Function : Independent/Modified Independent;Working/employed             Mobility Comments: pt works partime at [employer], 1 fall prior to admission, ambulatory without AD, doesn't drive as he doesn't have his license (wife transports him to/from work) ADLs Comments: bathes & dresses independently at baseline     Hand Dominance        Extremity/Trunk Assessment   Upper Extremity Assessment Upper Extremity Assessment: Generalized weakness (endorses preexisting B hand numbness that has worsened since admission (MD Notified))    Lower Extremity Assessment Lower Extremity Assessment: Generalized weakness       Communication   Communication: No difficulties  Cognition Arousal/Alertness: Awake/alert Behavior During Therapy: WFL for tasks assessed/performed Overall Cognitive Status: History of cognitive impairments - at baseline                                 General Comments: AxOx3 (oriented except for situation) but wife reports pt's cognition is back to baseline, follows simple commands consistently throughout session        General Comments      Exercises     Assessment/Plan    PT Assessment Patient needs continued PT services  PT Problem List Decreased strength;Decreased  activity tolerance;Decreased balance;Decreased mobility;Decreased knowledge of use of DME;Decreased safety awareness       PT Treatment Interventions DME instruction;Therapeutic exercise;Gait training;Balance training;Stair training;Neuromuscular re-education;Functional mobility training;Therapeutic activities;Patient/family education;Modalities    PT Goals (Current goals can be found in the Care Plan section)  Acute Rehab PT Goals Patient Stated Goal: get better PT Goal Formulation: With patient/family Time For Goal Achievement: 10/31/21 Potential to Achieve Goals: Good    Frequency Min 2X/week     Co-evaluation               AM-PAC PT "6 Clicks" Mobility  Outcome Measure Help needed turning from your back to your side while in a flat bed without using bedrails?: None Help needed moving from lying on your back to sitting on the side of a flat bed without using bedrails?: A Little Help needed moving to and from a bed to a chair (including a wheelchair)?: A Little Help needed standing up from a chair using your arms (e.g., wheelchair or bedside chair)?: A Little Help needed to walk in hospital room?: A Little Help needed climbing 3-5 steps with a railing? : A Little 6 Click Score: 19    End of Session Equipment Utilized During Treatment: Gait belt Activity Tolerance: Patient tolerated treatment well Patient left: in chair;with chair alarm set;with call bell/phone within reach;with family/visitor  present Nurse Communication: Mobility status PT Visit Diagnosis: Unsteadiness on feet (R26.81);Muscle weakness (generalized) (M62.81);Difficulty in walking, not elsewhere classified (R26.2)    Time: 1355-1415 PT Time Calculation (min) (ACUTE ONLY): 20 min   Charges:   PT Evaluation $PT Eval Moderate Complexity: 1 Mod PT Treatments $Therapeutic Activity: 8-22 mins        Lavone Nian, PT, DPT 10/17/21, 2:33 PM   Waunita Schooner 10/17/2021, 2:31 PM

## 2021-10-17 NOTE — Assessment & Plan Note (Signed)
Improving with creatinine at 1.90 , at baseline. Patient had worsening kidney function in recent admission, creatinine was up to 4.17 on 10/13/2021, which improved to 3.19 at discharge.   -Follow-up renal function by BMP

## 2021-10-17 NOTE — Progress Notes (Addendum)
        Date: 10/17/2021  Patient name: Dustin Wells  Medical record number: [id number]  Date of birth: 08-04-1965    56 year old man with a past medical history significant for hypertension hyperlipidemia he GERD gout depression anxiety testicular cancer diastolic heart failure atrial fibrillation alcohol abuse who had been recently admitted with syncope and worked up and ultimately discharged.  He is now readmitted with altered mental status and shortness of breath and abdominal pain distention and fever.  He underwent lumbar puncture which yielded 12 mL of CSF that was collected.  Opening and closing pressures were not documented CSF profile showed   804 white blood cells with 75% PMNs 19% lymphocytes protein of 103 and glucose of 54.  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  I discussed the case with Dr. Frances Maywood  with [facility] (who is a friend of mine)  She felt concerned to try to pin this immunocompetent patients to go encephalitis on HHV-6 it certainly can be reactivated in the context of other infections or inflammatory conditions (I did see this myself with a patient with ADEM after smallpox vaccination when CSF was sent to [facility])  HHV 6 HAS however been seen as a cause of an meningoencephalitis in immunocompetent patients in rare cases.  Per recommendations of Dr. Frances Maywood we are sending a quantitative PCR to [facility] on CSF  If that value is low would point to another diagnosis if it is elevated could corroborate HHV 8 as culprit  His herpes simplex PCR on the meningitis panel could also be a false negative PCR sent but fortunately we have another PCR and herpes simplex running.  VDRL was also sent on CSF and not back yet I added an RPR to his serum.  I also added an HIV quantitative RNA  I restarted  antibacterial coverage yesterday with Teflaro to cover bacterial pathogens that could cause meningitis but they are not on the meningitis panel.  I did not want to use vancomycin given patient already has CKD and is on GCV  We also ordered an MRI with contrast which showed some subcortical T2 hyperintensities that could be due to microvascular ischemia or potentially a demyelinating process though this sounds fairly unlikely to me.  Dr Delaine Lame will be back tomorrow to do an in person formal consultation.  Alcide Evener 10/17/2021, 12:31 PM

## 2021-10-17 NOTE — Progress Notes (Signed)
Progress Note   Patient: Dustin Wells QQV:[id number] DOB: 1965-10-25 DOA: 10/15/2021     2 DOS: the patient was seen and examined on 10/17/2021   Brief hospital course: Taken from H&P.  Dustin Wells is a 56 y.o. male with medical history significant of hypertension, hyperlipidemia, prediabetes, GERD, gout, depression with anxiety, testicular cancer, diastolic CHF, atrial fibrillation on Eliquis, CKD-3B, alcohol abuse, OSA on CPAP, obesity with BMI 35.94, syncope, thrombocytopenia, who presents with altered mental status, shortness breath, abdominal pain and distention, fever.   Patient was recently hospitalized from 7/26-7/27 due to syncope.  Patient had MRI for brain on 7/27 which showed chronic right MCA infarction, but no acute intracranial abnormalities.  Patient had 2D echo on 7/27 which showed EF of 55-60%.  His syncope was thought possibly due to hypovolemia.  Patient was treated with IV fluid with improvement.   Pt is confused, but still oriented x 3.  Patient cannot provide accurate medical history.  He moves all extremities normally.  No facial droop or slurred speech.  Per her wife at the bedside, has been confused since this AM.  Patient complains of shortness breath and mild dry cough, but denies chest pain.  He has fever, no chills.  His temperature is 100.6 in ED.  Patient does not have nausea, vomiting, diarrhea, but he has some central abdominal pain and abdominal distention.  Per his wife, patient had incontinence of bladder or bowel movement at home.  Denies symptoms of UTI.   Patient not using oxygen normally.  Patient has oxygen desaturation to 85% on room air, with respiratory distress and tachypnea, requiring 3L oxygen with 96% saturation now   Data reviewed independently and ED Course: pt was found to have WBC 8.7, lactic acid 1.6, INR 1.5, PTT 37, lipase 78, abnormal liver function (ALP 66, AST 78, ALT 64, total bilirubin 1.7), negative urinalysis, PCR negative, renal function has  been improving from recent level, temperature 100.6, blood pressure 147/83, heart rate 108, RR 33, chest x-ray showed cardiomegaly without infiltration. CT-head showed stable remote posterior right MCA territory infarct and no acute intracranial abnormality.  EKG:  Sinus rhythm, QTc 457, LAD, low voltage.  7/29: LP was done for concern of meningitis and it shows elevated leukocytosis at 804, protein of 103 and glucose of 54.  Met sepsis criteria, ID was consulted.  Antibiotics switched to Rocephin 2 g twice daily.  ID also recommended to check CSF syphilis and cryptococcal antigen.  He is also on Unasyn, vancomycin and acyclovir. Cryptococcal antigen negative, procalcitonin at 1.12, renal function with some improvement as compared to recent hospitalization, creatinine at 2.09 which is close to his baseline.  Preliminary CSF cultures negative.  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 HSV and VDRL pending.  Fungal cultures pending. ID advised to start him on ganciclovir. HIV labs are pending as HHV-6 encephalitis is more common in immunocompromised.  7/30: Patient remained stable.  Currently on ganciclovir and Teflaro.  MRI brain was done yesterday by ID and it was negative for any acute intracranial abnormalities or significant interval changes.  Did show stable encephalomalacia of right posterior temporal and parietal lobe.  There was Scattered subcortical T2 hyperintensities bilaterally are mildly advanced for age. The finding is nonspecific but can be seen in the setting of chronic microvascular ischemia, a demyelinating process such as multiple sclerosis, vasculitis, complicated migraine headaches, or as the sequelae of a prior infectious or inflammatory process. CSF cultures remain negative for any bacterial growth. ID  sent quantitative PCR for HHV-6 as advised by ID at [facility], Dr. Frances Maywood, as HHV-6 encephalitis is very rare in immunocompetent patients.  HIV, VDRL and RPR labs are  pending.   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 Mental status improved Please see below for further information and management.  Sepsis [facility]) Patient meets criteria for sepsis with fever 100.6, heart rate of 108, RR 33.  Lactic acid 1.6.   Since patient has altered mental status, will need to rule out meningitis. LP is performed by IR.  Which shows leukocytosis with 75% neutrophils, elevated protein and borderline low glucose. Meningitis/encephalitis panel positive for HH V6.  Which is very rare to cause encephalitis in immunocompetent patients.  Patient is a chronic alcoholic, never received any chemotherapy, only radiation therapy and on clomiphene for testicular cancer. Fungal and PCR pending.  HHV-6 quantitative PCR was also sent. Patient initially received broad-spectrum antibiotics with multiple agents which include cefepime followed by ceftriaxone, Augmentin, acyclovir and vancomycin. ID is on board -Currently on ganciclovir and ceftaroline          Acute metabolic encephalopathy Resolved.  Appears to be at baseline now. 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  MRI brain was also without any acute abnormality.  Multiple chronic hypodense lesions which can be due to some prior infection/inflammation or a demyelinating process. -Continue to monitor -Infection management as below  Acute respiratory failure with hypoxia (HCC) Resolved.  Patient is now on room air. Etiology is not clear.  May be secondary to sepsis and encephalitis.  Chest x-ray negative for effusion.  COVID PCR negative.  Patient is taking Eliquis, less likely to have PE.  Patient has history of diastolic CHF, but due to obesity, it is very difficult to assess volume status.  BNP at 256.  Patient did received IV fluid per sepsis protocol. He was given some IV Lasix  yesterday-appears euvolemic with slight worsening of his anemia.  Creatinine improving. -Stop IV diuresis -Bronchodilators  Chronic diastolic CHF (congestive heart failure) (HCC) 2D echo on 10/14/2021 showed EF 55-60%. Clinically appears euvolemic.  Mild worsening of azotemia, creatinine seems improving. -Stopping IV Lasix and will monitor  Alcohol use disorder, severe, dependence (HCC) - CIWA protocol  Atrial fibrillation, chronic (HCC) - Continue flecainide 100 mg twice daily -Restarted Eliquis-initially held for LP  HTN (hypertension) Blood pressure mostly within goal. -Keep holding Cozaar and amlodipine -IV hydralazine as needed  Stage 3b chronic kidney disease (CKD) ([place]) Improving with creatinine at 1.90 , at baseline. Patient had worsening kidney function in recent admission, creatinine was up to 4.17 on 10/13/2021, which improved to 3.19 at discharge.   -Follow-up renal function by BMP  Testicular cancer ([place]) -on clomiphene  Depression with anxiety - Continue home as needed Ativan which also help alcohol withdrawal  HLD (hyperlipidemia) - Lipitor  Thrombocytopenia ([place]) This is a chronic issue.  Platelets are 129 (91 on 10/14/2021), likely due to alcohol abuse.  No active bleeding. -Follow-up by CBC  OSA (obstructive sleep apnea) -CPAP at night  Obesity with body mass index of 30.0-39.9  BMI= 35.94  and BW= 120.2 -Diet and exercise.   -Encourage to lose weight.   Abdominal distention Pt has central abdominal tenderness and abdominal distention, etiology is not clear.  Lipase 78 CT abdomen and pelvis with following impression There is no evidence of intestinal obstruction or pneumoperitoneum. There is no hydronephrosis. Appendix is  not dilated.  There is patchy infiltrate in left lower lung field suggesting atelectasis/pneumonia.  There is 1.3 cm soft tissue density structure in sigmoid colon which may suggest fecal material in the lumen or polyp.  When the patient's clinical condition permits, endoscopy may be considered.  There is perinephric stranding around both kidneys which may be due to acute or chronic pyelonephritis or residual change from previous ureteric obstruction.  Small hiatal hernia.Diverticulosis of colon without signs of focal diverticulitis. Enlarged spleen. -Supportive care  Abnormal LFTs Patient has mild abnormal liver function with AST 78, ALT 64, total bilirubin 1.7, ALP 66.  Likely due to alcohol abuse and ongoing infection. -Judicious use Tylenol (patient cannot use NSAID for fever due to CKD)   Subjective: Patient was seen and examined today.  No new complaints.  Wife at bedside.  Denies any shortness of breath, headache or change in vision.  Feeling overall weak.  Physical Exam: Vitals:   10/17/21 0441 10/17/21 0446 10/17/21 0724 10/17/21 1122  BP: 126/77  113/81 111/80  Pulse: 74  84 72  Resp: 16  18 16   Temp: 99 F (37.2 C)  98.2 F (36.8 C) 98.1 F (36.7 C)  TempSrc: Oral     SpO2: 94%  93% 98%  Weight:  123.4 kg     General.  Obese gentleman, in no acute distress. Pulmonary.  Lungs clear bilaterally, normal respiratory effort. CV.  Regular rate and rhythm, no JVD, rub or murmur. Abdomen.  Soft, nontender, nondistended, BS positive. CNS.  Alert and oriented .  No focal neurologic deficit. Extremities.  No edema, no cyanosis, pulses intact and symmetrical. Psychiatry.  Judgment and insight appears normal.  Data Reviewed: Prior data reviewed  Family Communication: Discussed with wife at bedside  Disposition: Status is: Inpatient Remains inpatient appropriate because: Severity of illness   Planned Discharge Destination: Home  DVT prophylaxis.  Eliquis Time spent: 45 minutes  This record has been created using Systems analyst. Errors have been sought and corrected,but may not always be located. Such creation errors do not reflect on the standard of  care.  Author: Lorella Nimrod, MD 10/17/2021 1:58 PM  For on call review [email or web address].

## 2021-10-17 NOTE — TOC Initial Note (Signed)
Transition of Care [facility]) - Initial/Assessment Note    Patient Details  Name: Cordera Stineman MRN: [id number] Date of Birth: Mar 08, 1966  Transition of Care [facility]) CM/SW Contact:    Kerin Salen, RN Phone Number: 10/17/2021, 3:00 PM  Clinical Narrative:  TOCRN spoke with patient at bedside, who voices living with spouse, who provides transportation, cooking and shopping. Patient does not drive due to DWI 4 years ago. CM also discussed need for SA services, patient voices having completed two Detox. Programs in the past, however receptive to the [employer] provided. Patient voices wanting to get help and currently going to the [facility] program. TOC to continue to track patient for discharge needs, PT/OT pending.                    Patient Goals and CMS Choice        Expected Discharge Plan and Services                                                Prior Living Arrangements/Services                       Activities of Daily Living      Permission Sought/Granted                  Emotional Assessment              Admission diagnosis:  Generalized weakness [R53.1] Altered mental status, unspecified altered mental status type [R41.82] Syncope, unspecified syncope type [Z00] Acute metabolic encephalopathy [P23.30] Patient Active Problem List   Diagnosis Date Noted   Encephalitis    Acute metabolic encephalopathy 07/62/2633   Atrial fibrillation, chronic ([place]) 10/15/2021   Depression with anxiety 10/15/2021   Sepsis ([place]) 10/15/2021   Thrombocytopenia ([place]) 10/15/2021   HTN (hypertension) 10/15/2021   HLD (hyperlipidemia) 10/15/2021   Obesity with body mass index of 30.0-39.9 10/15/2021   Testicular cancer ([place]) 10/15/2021   Chronic diastolic CHF (congestive heart failure) ([place]) 10/15/2021   Abnormal LFTs 10/15/2021   Abdominal distention 10/15/2021   Acute respiratory failure with hypoxia ([place]) 10/15/2021   Syncope 10/13/2021    Morbid obesity ([place]) 10/13/2021   OSA (obstructive sleep apnea) 10/13/2021   AKI (acute kidney injury) ([place]) 10/13/2021   Stage 3b chronic kidney disease (CKD) ([place]) 10/13/2021   A-fib ([place]) 10/13/2021   Alcohol use disorder, severe, dependence ([place]) 07/01/2019   MDD (major depressive disorder), recurrent episode, moderate ([place]) 07/01/2019   GAD (generalized anxiety disorder) 07/01/2019   PCP:  Leonel Ramsay, MD Pharmacy:   [id number] [place], [place] - [address] AT [address] [place] [address] Phone: [phone] Fax: [phone]  [facility] [address] [place], [place] - [place] [address] Phone: [phone] Fax: [phone]     Social Determinants of Health (SDOH) Interventions    Readmission Risk Interventions     No data to display

## 2021-10-17 NOTE — Assessment & Plan Note (Signed)
2D echo on 10/14/2021 showed EF 55-60%. Clinically appears euvolemic.  Mild worsening of azotemia, creatinine seems improving. -Stopping IV Lasix and will monitor

## 2021-10-17 NOTE — Assessment & Plan Note (Signed)
Resolved.  Appears to be at baseline now. 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  MRI brain was also without any acute abnormality.  Multiple chronic hypodense lesions which can be due to some prior infection/inflammation or a demyelinating process. -Continue to monitor -Infection management as below

## 2021-10-17 NOTE — Assessment & Plan Note (Signed)
Patient meets criteria for sepsis with fever 100.6, heart rate of 108, RR 33.  Lactic acid 1.6.   Since patient has altered mental status, will need to rule out meningitis. LP is performed by IR.  Which shows leukocytosis with 75% neutrophils, elevated protein and borderline low glucose. Meningitis/encephalitis panel positive for HH V6.  Which is very rare to cause encephalitis in immunocompetent patients.  Patient is a chronic alcoholic, never received any chemotherapy, only radiation therapy and on clomiphene for testicular cancer. Fungal and PCR pending.  HHV-6 quantitative PCR was also sent. Patient initially received broad-spectrum antibiotics with multiple agents which include cefepime followed by ceftriaxone, Augmentin, acyclovir and vancomycin. ID is on board -Currently on ganciclovir and ceftaroline

## 2021-10-18 DIAGNOSIS — G0490 Encephalitis and encephalomyelitis, unspecified: Secondary | ICD-10-CM | POA: Diagnosis not present

## 2021-10-18 LAB — GLUCOSE, CAPILLARY
Glucose-Capillary: 105 mg/dL — ABNORMAL HIGH (ref 70–99)
Glucose-Capillary: 126 mg/dL — ABNORMAL HIGH (ref 70–99)
Glucose-Capillary: 94 mg/dL (ref 70–99)
Glucose-Capillary: 96 mg/dL (ref 70–99)

## 2021-10-18 LAB — HIV-1 RNA QUANT-NO REFLEX-BLD
HIV 1 RNA Quant: <20 copies/mL
LOG10 HIV-1 RNA: UNDETERMINED log10copy/mL

## 2021-10-18 LAB — RPR: RPR Ser Ql: NONREACTIVE

## 2021-10-18 LAB — PATHOLOGIST SMEAR REVIEW

## 2021-10-18 LAB — CORTISOL-AM, BLOOD: Cortisol - AM: 6.2 ug/dL — ABNORMAL LOW (ref 6.7–22.6)

## 2021-10-18 MED ORDER — METHOCARBAMOL 500 MG PO TABS
500.0000 mg | ORAL_TABLET | Freq: Once | ORAL | Status: AC
  Administered 2021-10-18: 500 mg via ORAL
  Filled 2021-10-18: qty 1

## 2021-10-18 MED ORDER — SODIUM CHLORIDE 0.9 % IV SOLN
2.5000 mg/kg | Freq: Two times a day (BID) | INTRAVENOUS | Status: DC
  Administered 2021-10-18 – 2021-10-19 (×2): 300 mg via INTRAVENOUS
  Filled 2021-10-18 (×4): qty 6

## 2021-10-18 MED ORDER — MEROPENEM 1 G IV SOLR
2.0000 g | Freq: Three times a day (TID) | INTRAVENOUS | Status: DC
  Administered 2021-10-18 – 2021-10-19 (×3): 2 g via INTRAVENOUS
  Filled 2021-10-18 (×4): qty 40

## 2021-10-18 NOTE — Progress Notes (Signed)
Spoke with Sheppard Coil in pharmacy today about the administration of Cytovene and the discrepancy with the day shift and night shift regarding how to properly administer the medication. Was informed by pharmacy yesterday that RN could administer medication and the medication was placed in the fridge located in the med room without proper tubing and adaptor on day shift for the past 2 days.When speaking with pharmacy on Sunday, they informed me that I needed to consult my charge nurse and the standards for our floor despite making them aware that the night shift nurse was provided the same medication in a biohazard box, with special tubing and adaptor. Alexander discussed he would inform [place], spoke with my charge nurse Amy, and will follow up with Lattie Haw. There was no education received about the medication when it was ordered and though the [facility] listed distinct instructions, the weekend pharmacist on day shift implied that it was to nursing staff discretion on the administration of the drug. Dr. Reesa Chew was made aware prior to administration of the drug on 10/17/21

## 2021-10-18 NOTE — Consult Note (Signed)
NAME: Dustin Wells  DOB: 1966-02-27  MRN: [id number]  Date/Time: 10/18/2021 12:24 PM  REQUESTING PROVIDER: Dr.Amin Subjective:  REASON FOR CONSULT: meningitis/encephalitis ? Dustin Wells is a 56 y.o. with a history of Afib, HTN, Depression, AUD, CKD, CVA, HLD, testicular cancer (2013-surgery /radiation), PTSD, OSA Recently prescribed clomiphene for low testosterone ( 09/29/21) Presents with altered mental status on 10/15/21 Pt was not feeing well since   10/09/21 - HE was not eating. Usually he drinks 4 cups of tequila a day. He did not feel like drinking as well that day.  He did not have any fever. He went to work on Monday ( works at [employer]) And was off on Tuesday . On Wednesday 10/13/21 hile at work he had near syncope and was brought to the ED . BP was low and he was given fluids and was discharged the next day. As per his wife after he reached home he was lethargic and during the night into the morning was more confused, incontinent of urine and bowels. He was shaking and EMS was called and he was brought to the ED on 10/15/21 Wife did not check temperature at home In the ED vitals initially 93/71, temp 98.3, pulse 108, sats 97% Wbc 8.7, HB 13.5, PLT 128, cr 2.67 He underwent LP and it showed 804 wbc ( 75% N) , Protein 103 and glucose 54 -was started on IV ampicillin, vanco and ceftriaxone.and acyclovir . 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 CSF HHV6 PCR quantitative was sent  I am asked to see the  patient for the same Pt is awake and alert and pretty much back to baseline. He says he drinks heavily but has not been drinking for the past week. 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   Past Medical History:  Diagnosis Date   Atrial fibrillation (HCC)    CHF (congestive heart failure) ([place])    Hypertension    Renal disorder    Stroke [facility])    Testicular cancer [facility])     Past Surgical History:  Procedure Laterality Date   cardiac albation     CARDIAC ELECTROPHYSIOLOGY MAPPING AND ABLATION     Testis removal left Social History   Socioeconomic History   Marital status: Married    Spouse name: Not on file   Number of children: Not on file   Years of education: Not on file   Highest education level: Not on file  Occupational History   Not on file  Tobacco Use   Smoking status: Never   Smokeless tobacco: Never  Substance and Sexual Activity   Alcohol use: Yes    Alcohol/week: 21.0 standard drinks of alcohol    Types: 21 Standard drinks or equivalent per week   Drug use: Not Currently   Sexual activity: Not on file  Other Topics Concern   Not on file  Social History Narrative   Not on file   Social Determinants of Health   Financial Resource Strain: Not on file  Food Insecurity: Not on file  Transportation Needs: Not on file  Physical Activity: Not on file  Stress: Not  on file  Social Connections: Not on file  Intimate Partner Violence: Not on file    Family History  Problem Relation Age of Onset   Hypertension Mother    Heart disease Father    Allergies  Allergen Reactions   Rivaroxaban Diarrhea, Nausea Only and Other (See Comments)    Nausea, diarrhea, abdominal pain Nausea, diarrhea, abdominal pain Nausea, diarrhea, abdominal pain    Sulfamethoxazole-Trimethoprim Rash   I? Current Facility-Administered Medications  Medication Dose Route Frequency Provider Last Rate Last Admin   acetaminophen (TYLENOL) tablet 650 mg  650 mg Oral Q6H PRN Ivor Costa, MD       albuterol (PROVENTIL) (2.5 MG/3ML) 0.083% nebulizer solution 2.5 mg  2.5 mg Inhalation Q4H PRN Ivor Costa, MD       allopurinol (ZYLOPRIM) tablet 300 mg  300 mg Oral Daily  Ivor Costa, MD   300 mg at 10/18/21 1014   apixaban (ELIQUIS) tablet 5 mg  5 mg Oral BID Ivor Costa, MD   5 mg at 10/18/21 1016   atorvastatin (LIPITOR) tablet 40 mg  40 mg Oral Daily Ivor Costa, MD   40 mg at 10/17/21 1727   ceftaroline (TEFLARO) 600 mg in sodium chloride 0.9 % 100 mL IVPB  600 mg Intravenous Q12H Tommy Medal, Lavell Islam, MD 100 mL/hr at 10/18/21 1023 600 mg at 10/18/21 1023   cholecalciferol (VITAMIN D3) 25 MCG (1000 UNIT) tablet 1,000 Units  1,000 Units Oral Daily Ivor Costa, MD   1,000 Units at 10/18/21 1017   cyanocobalamin (VITAMIN B12) tablet 1,000 mcg  1,000 mcg Oral Daily Ivor Costa, MD   1,000 mcg at 10/18/21 1016   dextromethorphan-guaiFENesin ([place] DM) 30-600 MG per 12 hr tablet 1 tablet  1 tablet Oral BID PRN Ivor Costa, MD       flecainide [facility]) tablet 100 mg  100 mg Oral Q12H Ivor Costa, MD   100 mg at 62/13/08 6578   folic acid (FOLVITE) tablet 1 mg  1 mg Oral Daily Ivor Costa, MD   1 mg at 10/18/21 1015   ganciclovir (CYTOVENE) 600 mg in sodium chloride 0.9 % 100 mL IVPB  5 mg/kg Intravenous Q12H Tommy Medal, Lavell Islam, MD 100 mL/hr at 10/18/21 0515 600 mg at 10/18/21 0515   hydrALAZINE (APRESOLINE) injection 5 mg  5 mg Intravenous Q2H PRN Ivor Costa, MD       insulin aspart (novoLOG) injection 0-5 Units  0-5 Units Subcutaneous QHS Ivor Costa, MD       insulin aspart (novoLOG) injection 0-9 Units  0-9 Units Subcutaneous TID WC Ivor Costa, MD   2 Units at 10/17/21 1727   LORazepam (ATIVAN) injection 0-4 mg  0-4 mg Intravenous Q12H Ivor Costa, MD       LORazepam (ATIVAN) tablet 1-4 mg  1-4 mg Oral Q1H PRN Ivor Costa, MD       Or   LORazepam (ATIVAN) injection 1-4 mg  1-4 mg Intravenous Q1H PRN Ivor Costa, MD       LORazepam (ATIVAN) tablet 2 mg  2 mg Oral QHS PRN Ivor Costa, MD       multivitamin with minerals tablet 1 tablet  1 tablet Oral Daily Ivor Costa, MD   1 tablet at 10/18/21 1014   ondansetron (ZOFRAN) injection 4 mg  4 mg Intravenous Q8H PRN Ivor Costa,  MD       pantoprazole (PROTONIX) EC tablet 40 mg  40 mg Oral Daily Ivor Costa, MD   40 mg at  10/18/21 1014   sodium bicarbonate tablet 650 mg  650 mg Oral BID Ivor Costa, MD   650 mg at 10/18/21 1015   thiamine (VITAMIN B1) tablet 100 mg  100 mg Oral Daily Ivor Costa, MD   100 mg at 10/18/21 1014   Or   thiamine (VITAMIN B1) injection 100 mg  100 mg Intravenous Daily Ivor Costa, MD         Abtx:  Anti-infectives (From admission, onward)    Start     Dose/Rate Route Frequency Ordered Stop   10/16/21 2200  ceftaroline (TEFLARO) 600 mg in sodium chloride 0.9 % 100 mL IVPB        600 mg 100 mL/hr over 60 Minutes Intravenous Every 12 hours 10/16/21 1609     10/16/21 1800  vancomycin (VANCOREADY) IVPB 1250 mg/250 mL  Status:  Discontinued        1,250 mg 166.7 mL/hr over 90 Minutes Intravenous Every 24 hours 10/15/21 1637 10/16/21 1200   10/16/21 1400  ganciclovir (CYTOVENE) 600 mg in sodium chloride 0.9 % 100 mL IVPB        5 mg/kg  120.2 kg 100 mL/hr over 60 Minutes Intravenous Every 12 hours 10/16/21 1207     10/16/21 0600  cefTRIAXone (ROCEPHIN) 2 g in sodium chloride 0.9 % 100 mL IVPB  Status:  Discontinued        2 g 200 mL/hr over 30 Minutes Intravenous Every 12 hours 10/15/21 1803 10/15/21 1804   10/16/21 0600  cefTRIAXone (ROCEPHIN) 2 g in sodium chloride 0.9 % 100 mL IVPB  Status:  Discontinued       Note to Pharmacy: Dr. Tommy Medal of ID recommended to switch cefepime to 2 g of Rocephin twice daily for meningitis   2 g 200 mL/hr over 30 Minutes Intravenous Every 12 hours 10/15/21 1804 10/16/21 1214   10/15/21 2200  ceFEPIme (MAXIPIME) 2 g in sodium chloride 0.9 % 100 mL IVPB  Status:  Discontinued        2 g 200 mL/hr over 30 Minutes Intravenous Every 12 hours 10/15/21 1637 10/15/21 1803   10/15/21 1815  cefTRIAXone (ROCEPHIN) 1 g in sodium chloride 0.9 % 100 mL IVPB        1 g 200 mL/hr over 30 Minutes Intravenous  Once 10/15/21 1803 10/15/21 2232   10/15/21 1800   Ampicillin-Sulbactam (UNASYN) 3 g in sodium chloride 0.9 % 100 mL IVPB  Status:  Discontinued        3 g 200 mL/hr over 30 Minutes Intravenous Every 6 hours 10/15/21 1637 10/16/21 1209   10/15/21 1700  acyclovir (ZOVIRAX) 945 mg in dextrose 5 % 250 mL IVPB  Status:  Discontinued        10 mg/kg  94.6 kg (Adjusted) 268.9 mL/hr over 60 Minutes Intravenous Every 12 hours 10/15/21 1637 10/16/21 1200   10/15/21 1645  vancomycin (VANCOCIN) 2,500 mg in sodium chloride 0.9 % 500 mL IVPB        2,500 mg 262.5 mL/hr over 120 Minutes Intravenous  Once 10/15/21 1637 10/15/21 1955   10/15/21 1030  cefTRIAXone (ROCEPHIN) 1 g in sodium chloride 0.9 % 100 mL IVPB        1 g 200 mL/hr over 30 Minutes Intravenous  Once 10/15/21 1017 10/15/21 1105       REVIEW OF SYSTEMS:  Const: negative fever, negative chills, negative weight loss Eyes: negative diplopia or visual changes, negative eye pain ENT: negative coryza, negative sore throat Resp:  negative cough, hemoptysis, dyspnea Cards: negative for chest pain, palpitations, lower extremity edema GU: incontinence of bladder GI: positive for abdominal pain,  Incontinence of stool Skin: negative for rash and pruritus Heme: negative for easy bruising and gum/nose bleeding MS: negative for myalgias, arthralgias, back pain and muscle weakness Neurolo:dizziness, confusion Psych: negative for feelings of anxiety, depression  Endocrine:  diabetes on ozempic Allergy/Immunology-sulfa Objective:  VITALS:  BP 122/84 (BP Location: Right Arm)   Pulse 87   Temp 97.6 F (36.4 C)   Resp 20   Wt 117.5 kg   SpO2 93%   BMI 35.13 kg/m   PHYSICAL EXAM:  General: Alert, cooperative, no distress, appears stated age.  Head: Normocephalic, without obvious abnormality, atraumatic. Eyes: Conjunctivae clear, anicteric sclerae. Pupils are equal ENT Nares normal. No drainage or sinus tenderness. Lips, mucosa, and tongue normal. No Thrush Neck: Supple, symmetrical, no  adenopathy, thyroid: non tender no carotid bruit and no JVD. Back: No CVA tenderness. Lungs: Clear to auscultation bilaterally. No Wheezing or Rhonchi. No rales. Heart: Regular rate and rhythm, no murmur, rub or gallop. Abdomen: Soft, non-tender,not distended. Bowel sounds normal. No masses Extremities: atraumatic, no cyanosis. No edema. No clubbing Skin: No rashes or lesions. Or bruising Lymph: Cervical, supraclavicular normal. Neurologic: gait not observed Cerebellar signs not tested Left arm weakness 4/5  Pertinent Labs Lab Results CBC    Component Value Date/Time   WBC 10.9 (H) 10/16/2021 0723   RBC 4.04 (L) 10/16/2021 0723   HGB 12.8 (L) 10/16/2021 0723   HCT 36.9 (L) 10/16/2021 0723   PLT 129 (L) 10/16/2021 0723   MCV 91.3 10/16/2021 0723   MCH 31.7 10/16/2021 0723   MCHC 34.7 10/16/2021 0723   RDW 13.8 10/16/2021 0723   LYMPHSABS 0.7 10/15/2021 1023   MONOABS 0.7 10/15/2021 1023   EOSABS 0.0 10/15/2021 1023   BASOSABS 0.1 10/15/2021 1023       Latest Ref Rng & Units 10/17/2021   10:20 AM 10/16/2021    7:23 AM 10/15/2021   10:23 AM  CMP  Glucose 70 - 99 mg/dL 195  181  134   BUN 6 - 20 mg/dL 41  29  24   Creatinine 0.61 - 1.24 mg/dL 1.95  2.09  2.05   Sodium 135 - 145 mmol/L 134  137  136   Potassium 3.5 - 5.1 mmol/L 3.4  3.7  3.9   Chloride 98 - 111 mmol/L 97  102  105   CO2 22 - 32 mmol/L '25  21  22   '$ Calcium 8.9 - 10.3 mg/dL 8.8  8.5  8.8   Total Protein 6.5 - 8.1 g/dL   7.2   Total Bilirubin 0.3 - 1.2 mg/dL   1.6   Alkaline Phos 38 - 126 U/L   68   AST 15 - 41 U/L   60   ALT 0 - 44 U/L   35       Microbiology: Recent Results (from the past 240 hour(s))  SARS Coronavirus 2 by RT PCR (hospital order, performed in [place] hospital lab) *cepheid single result test* Urine, Clean Catch     Status: None   Collection Time: 10/13/21  4:29 PM   Specimen: Urine, Clean Catch; Nasal Swab  Result Value Ref Range Status   SARS Coronavirus 2 by RT PCR NEGATIVE  NEGATIVE Final    Comment: (NOTE) SARS-CoV-2 target nucleic acids are NOT DETECTED.  The SARS-CoV-2 RNA is generally detectable in upper and lower respiratory  specimens during the acute phase of infection. The lowest concentration of SARS-CoV-2 viral copies this assay can detect is 250 copies / mL. A negative result does not preclude SARS-CoV-2 infection and should not be used as the sole basis for treatment or other patient management decisions.  A negative result may occur with improper specimen collection / handling, submission of specimen other than nasopharyngeal swab, presence of viral mutation(s) within the areas targeted by this assay, and inadequate number of viral copies (<250 copies / mL). A negative result must be combined with clinical observations, patient history, and epidemiological information.  Fact Sheet for Patients:   https://www.patel.info/  Fact Sheet for Healthcare Providers: https://hall.com/  This test is not yet approved or  cleared by the [place] FDA and has been authorized for detection and/or diagnosis of SARS-CoV-2 by FDA under an Emergency Use Authorization (EUA).  This EUA will remain in effect (meaning this test can be used) for the duration of the COVID-19 declaration under Section 564(b)(1) of the Act, 21 U.S.C. section 360bbb-3(b)(1), unless the authorization is terminated or revoked sooner.  Performed at [facility], [place], [place] [address]   Resp Panel by RT-PCR (Flu A&B, Covid) Anterior Nasal Swab     Status: None   Collection Time: 10/15/21 10:23 AM   Specimen: Anterior Nasal Swab  Result Value Ref Range Status   SARS Coronavirus 2 by RT PCR NEGATIVE NEGATIVE Final    Comment: (NOTE) SARS-CoV-2 target nucleic acids are NOT DETECTED.  The SARS-CoV-2 RNA is generally detectable in upper respiratory specimens during the acute phase of infection. The  lowest concentration of SARS-CoV-2 viral copies this assay can detect is 138 copies/mL. A negative result does not preclude SARS-Cov-2 infection and should not be used as the sole basis for treatment or other patient management decisions. A negative result may occur with  improper specimen collection/handling, submission of specimen other than nasopharyngeal swab, presence of viral mutation(s) within the areas targeted by this assay, and inadequate number of viral copies(<138 copies/mL). A negative result must be combined with clinical observations, patient history, and epidemiological information. The expected result is Negative.  Fact Sheet for Patients:  EntrepreneurPulse.com.au  Fact Sheet for Healthcare Providers:  IncredibleEmployment.be  This test is no t yet approved or cleared by the [place] FDA and  has been authorized for detection and/or diagnosis of SARS-CoV-2 by FDA under an Emergency Use Authorization (EUA). This EUA will remain  in effect (meaning this test can be used) for the duration of the COVID-19 declaration under Section 564(b)(1) of the Act, 21 U.S.C.section 360bbb-3(b)(1), unless the authorization is terminated  or revoked sooner.       Influenza A by PCR NEGATIVE NEGATIVE Final   Influenza B by PCR NEGATIVE NEGATIVE Final    Comment: (NOTE) The Xpert Xpress SARS-CoV-2/FLU/RSV plus assay is intended as an aid in the diagnosis of influenza from Nasopharyngeal swab specimens and should not be used as a sole basis for treatment. Nasal washings and aspirates are unacceptable for Xpert Xpress SARS-CoV-2/FLU/RSV testing.  Fact Sheet for Patients: EntrepreneurPulse.com.au  Fact Sheet for Healthcare Providers: IncredibleEmployment.be  This test is not yet approved or cleared by the [place] FDA and has been authorized for detection and/or diagnosis of SARS-CoV-2 by FDA under  an Emergency Use Authorization (EUA). This EUA will remain in effect (meaning this test can be used) for the duration of the COVID-19 declaration under Section 564(b)(1) of the Act, 21 U.S.C. section  360bbb-3(b)(1), unless the authorization is terminated or revoked.  Performed at [facility], [address] [place], [place] [address]   Blood Culture (routine x 2)     Status: None (Preliminary result)   Collection Time: 10/15/21 10:23 AM   Specimen: BLOOD  Result Value Ref Range Status   Specimen Description BLOOD LEFT ANTECUBITAL  Final   Special Requests   Final    BOTTLES DRAWN AEROBIC AND ANAEROBIC Blood Culture adequate volume   Culture   Final    NO GROWTH 3 DAYS Performed at [facility], [address] [place], [place] [address]    Report Status PENDING  Incomplete  Urine Culture     Status: None   Collection Time: 10/15/21 12:21 PM   Specimen: In/Out Cath Urine  Result Value Ref Range Status   Specimen Description   Final    IN/OUT CATH URINE Performed at [facility], [address] [place], [place] [address]    Special Requests   Final    NONE Performed at [facility], [address] [place], [place] [address]    Culture   Final    NO GROWTH Performed at [facility], [place] [address] [place], [place] [address]    Report Status 10/16/2021 FINAL  Final  CSF culture w Gram Stain     Status: None (Preliminary result)   Collection Time: 10/15/21  4:57 PM   Specimen: CSF; Cerebrospinal Fluid  Result Value Ref Range Status   Specimen Description   Final    CSF Performed at [facility], [address], [place], [place] [address]    Special Requests   Final    NONE Performed at [facility], [address] [place], [place] [address]    Gram Stain   Final    WBC SEEN RED BLOOD CELLS PRESENT NO ORGANISMS SEEN Performed at [facility], [address], [place], [place] [address]     Culture   Final    NO GROWTH 3 DAYS Performed at [facility], [place] [address] [place], [place] [address]    Report Status PENDING  Incomplete  Culture, fungus without smear     Status: None (Preliminary result)   Collection Time: 10/15/21  4:58 PM   Specimen: CSF; Cerebrospinal Fluid  Result Value Ref Range Status   Specimen Description   Final    CSF Performed at [facility], [address] [place], [place] [address]    Special Requests   Final    NONE Performed at [facility], [address] [place], [place] [address]    Culture   Final    NO GROWTH 2 DAYS Performed at [facility], [place] [address] [place], [place] [address]    Report Status PENDING  Incomplete  Culture, blood (Routine X 2) w Reflex to ID Panel     Status: None (Preliminary result)   Collection Time: 10/17/21  6:01 PM   Specimen: BLOOD LEFT ARM  Result Value Ref Range Status   Specimen Description BLOOD LEFT ARM  Final   Special Requests   Final    BOTTLES DRAWN AEROBIC AND ANAEROBIC BACTEROIDES CACCAE   Culture   Final    NO GROWTH < 12 HOURS Performed at [facility], [address] [place],  [address]    Report Status PENDING  Incomplete    IMAGING RESULTS:  I have personally reviewed the films  CT head- encephalomalacia in the rt parietal and frontal lobes due remote posterior Rt MCA infarct ex vacuo dilatation of the rt lateral ventricle posteriorly  ? Impression/Recommendation ? ?56 yr male presenting with presyncope, hypotension and discharged after hydration presented within 24 hrs with altered mental status CSF neutrophilic pleocytosis with high protein  Encephalitis like picture - neutrophilic pleocytosis with swift improvement in his condition CSF PCR shows HHV6 positive , but patient is not immune compromised in the true sense like transplantation, chemo etc He doe shave a /o testicular cancer and removal and  radiation  D.D' Bacterial meningitis less likely- but would change ceftaroline to Meropenem to cover listeia and other organisms- rhombencephalitis  Viral infections- enterovirus, WNV remotely possible- will see whether we have enough csf to test HHV6 came positive- ? False positive Await csf HHV6 PCR Currently on ganciclovir  R/o paraneoplastic Autoimmune Malignant Seizures Normal pressure hydrocephalus  Recommend neuroconsult   Afib- on flecanide  Alcohol use disorder with peripheral neuropathy   AKI on CKD- observe closely while on ganciclovir  HTN- soft BP on presentation PT- gait needs to be assessed ___________________________________________________ Discussed with patient,wife, daughter and requesting provider

## 2021-10-18 NOTE — Progress Notes (Signed)
       CROSS COVER NOTE  NAME: Dustin Wells MRN: [id number] DOB : 1966/01/15    Date of Service   10/18/21  HPI/Events of Note   Muscle spasm  Interventions   Plan: Robaxin x1 Magnesium added to AM labs       This document was prepared using Dragon voice recognition software and may include unintentional dictation errors.  Neomia Glass DNP, MHA, FNP-BC Nurse Practitioner [facility] Pager [phone]

## 2021-10-18 NOTE — Assessment & Plan Note (Signed)
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 Mental status improved Please see below for further information and management.

## 2021-10-18 NOTE — Progress Notes (Signed)
Mobility Specialist - Progress Note      10/18/21 0946  Mobility  Activity Ambulated with assistance in room  Level of Assistance Standby assist, set-up cues, supervision of patient - no hands on  Assistive Device Front wheel walker  Distance Ambulated (ft) 10 ft  Activity Response Tolerated well  $Mobility charge 1 Mobility   Pt in bathroom upon entry. Pt utilizing RA. Pt ambulated from bathroom to bed with min assist. Pt left in bed with needs in reach. No complaints.   [place]ie [place] Mobility Specialist 10/18/21 9:50 AM

## 2021-10-18 NOTE — Assessment & Plan Note (Signed)
2D echo on 10/14/2021 showed EF 55-60%. Clinically appears euvolemic.  Mild worsening of azotemia, creatinine seems improving.  Received some IV Lasix. -Continue to monitor.

## 2021-10-18 NOTE — Progress Notes (Signed)
MAR instructs VAS to hang 0200 antiviral agent. Consult placed by primary unit nurse.  VAS RN replied that unit RN should be able to hang. However, special tubing is needed due to a BD Phaseal adapter in place and unit RN isn't familiar with BD Phaseal tubing. Corporate treasurer made aware. Current staff on unit isn't familiar with this connection/drug administration. Pharmacy made aware as well, and said will follow up with morning team.

## 2021-10-18 NOTE — Progress Notes (Signed)
Spoke with Dr Reesa Chew via secure chat on 10/17/21 after being informed from night shift that the medication was hand delivered by pharmacy, given by the VAS team and in biohazard packaging and special tubing and adaptor. It was placed in our refrigerator in med room with no indication that it was to hung in a special manner except for the information provided on the [facility] Product Instructions: Hung by VAS Team. [place]. Dispose of in March ARB      Me: There is no one answering in Vascular to hang the antibiotic for 2:00pm.   It looks like it is supposed to be hung by them only and I mistakenly hung it yesterday afternoon with no familiarity of the drug.   Dr. Reesa Chew: Why vascular is going to give antibiotic, they are not even on his case, please contact pharmacy as both of his agents ,anti viral and antibiotic are Q 12 hourly   On the phone with them now

## 2021-10-18 NOTE — Assessment & Plan Note (Signed)
Resolved.  Patient is now on room air. Etiology is not clear.  May be secondary to sepsis and encephalitis.  Chest x-ray negative for effusion.  COVID PCR negative.  Patient is taking Eliquis, less likely to have PE.  Patient has history of diastolic CHF, but due to obesity, it is very difficult to assess volume status.  BNP at 256.  Patient did received IV fluid per sepsis protocol. He was given some IV Lasix for 1 day-appears euvolemic with slight worsening of his anemia.  Creatinine improving. -Bronchodilators

## 2021-10-18 NOTE — Progress Notes (Signed)
IV consult was placed for IV med infusion. Unit Rn needing assistance w/ IV tubing sent from Pharmacy. Tubing needing another connector piece;awaiting for pharmacy to send this piece, supervisor informed.

## 2021-10-18 NOTE — Assessment & Plan Note (Signed)
Resolved.  Appears to be at baseline now. 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  MRI brain was also without any acute abnormality.  Multiple chronic hypodense lesions which can be due to some prior infection/inflammation or a demyelinating process. -Continue to monitor -Infection management as below

## 2021-10-18 NOTE — Assessment & Plan Note (Signed)
Patient meets criteria for sepsis with fever 100.6, heart rate of 108, RR 33.  Lactic acid 1.6.   Since patient has altered mental status, will need to rule out meningitis. LP is performed by IR.  Which shows leukocytosis with 75% neutrophils, elevated protein and borderline low glucose. Meningitis/encephalitis panel positive for HH V6.  Which is very rare to cause encephalitis in immunocompetent patients.  Patient is a chronic alcoholic, never received any chemotherapy, only radiation therapy and on clomiphene for testicular cancer. Fungal cultures negative, HSV 1-2 negative, RPR negative.  HHV-6 quantitative PCR was also sent. Patient initially received broad-spectrum antibiotics with multiple agents which include cefepime followed by ceftriaxone, Augmentin, acyclovir and vancomycin. ID is on board -Currently on ganciclovir and ceftaroline

## 2021-10-18 NOTE — Evaluation (Signed)
Occupational Therapy Evaluation Patient Details Name: Dustin Wells MRN: [id number] DOB: 01-10-1966 Today's Date: 10/18/2021   History of Present Illness Pt is a 56 y/o M admitted on 10/15/21 after presenting with c/o AMS, SOB, abdominal pain & distension & fever. Pt was recently hospitalized from 7/26-7/27 2/2 syncope & MRI showed chronic R MCA infarction & syncope was thought to be due to hypovolemia. 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 PMH: HTN, HLD, prediabetes, GERD, gout, depression with anxiety, testicular CA, dCHF, a-fib on eliquis, CKD3B, alcohol abuse, OSA on CPAP, obesity, syncope, thrombocytopenia   Clinical Impression   Pt was seen for OT evaluation this date. Prior to hospital admission, pt was ambulating without AD and indep with ADL tasks. He works part time and denies difficulty with med mgt. Pt completed bed mobility with supervision, CGA for ADL transfers and mobility in room and hallway with RW and intermittent VC for RW mgt to improve safety/balance. Family member present endorses that pt tends to shuffle his feet. VC for improving ADL mobility. Pt required supervision for LB dressing and was noted to have decreased LUE involvement in donning his R sock. When asked, pt reports he has some residual LUE deficits from a stroke in 2018. With cues to improve visual attention to the L hand, pt able to improve his LUE involvement while donning L sock and using RUE to stabilize his leg in modified figure 4 position. Currently pt demonstrates impairments as described below (See OT problem list) which functionally limit his ability to perform ADL/self-care tasks. Pt would benefit from skilled OT services to address noted impairments and functional limitations (see below for any additional details) in order to maximize safety and independence while minimizing falls risk and caregiver burden. Upon hospital discharge, recommend HHOT to  maximize pt safety and return to functional independence during meaningful occupations of daily life.     Recommendations for follow up therapy are one component of a multi-disciplinary discharge planning process, led by the attending physician.  Recommendations may be updated based on patient status, additional functional criteria and insurance authorization.   Follow Up Recommendations  Home health OT    Assistance Recommended at Discharge Frequent or constant Supervision/Assistance  Patient can return home with the following A little help with walking and/or transfers;A little help with bathing/dressing/bathroom;Assistance with cooking/housework;Assist for transportation;Help with stairs or ramp for entrance;Direct supervision/assist for medications management    Functional Status Assessment  Patient has had a recent decline in their functional status and demonstrates the ability to make significant improvements in function in a reasonable and predictable amount of time.  Equipment Recommendations  Other (comment);Tub/shower bench (2WW)    Recommendations for Other Services       Precautions / Restrictions Precautions Precautions: Fall Restrictions Weight Bearing Restrictions: No      Mobility Bed Mobility Overal bed mobility: Needs Assistance Bed Mobility: Supine to Sit, Sit to Supine     Supine to sit: Supervision, HOB elevated Sit to supine: Supervision        Transfers Overall transfer level: Needs assistance Equipment used: Rolling walker (2 wheels) Transfers: Sit to/from Stand Sit to Stand: Min guard           General transfer comment: VC for hand placement to improve anterior weight shift      Balance Overall balance assessment: Needs assistance Sitting-balance support: Feet supported, No upper extremity supported Sitting balance-Leahy Scale: Fair Sitting balance - Comments: able to don socks  seated EOB and leaning back some without LOB   Standing  balance support: During functional activity, No upper extremity supported, Reliant on assistive device for balance                               ADL either performed or assessed with clinical judgement   ADL Overall ADL's : Needs assistance/impaired                                       General ADL Comments: Pt required CGA for transfers, supervision for LB dressing from seated position to don socks, and CGA for mobility with a RW and some intermittent cueing for safety/sequencing. Pt endorsed LUE deficits from a stroke in 2018 which did impair his LUE involvement in LB dressing but he was able to compensate and with cues from OT to better able to incorporate his LUE into the task.     Vision         Perception     Praxis      Pertinent Vitals/Pain Pain Assessment Pain Assessment: No/denies pain     Hand Dominance Right   Extremity/Trunk Assessment Upper Extremity Assessment Upper Extremity Assessment: Generalized weakness;LUE deficits/detail (hx bilat hand numbness per pt report) LUE Deficits / Details: pt endorses some coordination deficits since prior stroke in 2018 LUE Coordination: decreased fine motor   Lower Extremity Assessment Lower Extremity Assessment: Generalized weakness       Communication Communication Communication: No difficulties   Cognition Arousal/Alertness: Awake/alert Behavior During Therapy: WFL for tasks assessed/performed Overall Cognitive Status: History of cognitive impairments - at baseline                                 General Comments: PRN VC for safety/sequencing commands     General Comments       Exercises Other Exercises Other Exercises: Pt instructed in strategies to improve LUE involvement into ADL tasks. Also instructed in RW mgt during ADL mobility/transfers to improve safety   Shoulder Instructions      Home Living Family/patient expects to be discharged to:: Private  residence Living Arrangements: Spouse/significant other Available Help at Discharge: Family;Available 24 hours/day (wife works from home) Type of Home: House Home Access: Stairs to enter Technical brewer of Steps: 1-2 Entrance Stairs-Rails: None (at front entrance (prefers front entrance over back entrance with rails as back is steep)) Home Layout: One level     Bathroom Shower/Tub: Teacher, early years/pre: Handicapped height     Home Equipment: None          Prior Functioning/Environment Prior Level of Function : Independent/Modified Independent;Working/employed             Mobility Comments: pt works partime at [employer], 1 fall prior to admission, ambulatory without AD, doesn't drive as he doesn't have his license (wife transports him to/from work) ADLs Comments: bathes & dresses independently at baseline        OT Problem List: Decreased strength;Impaired UE functional use;Impaired balance (sitting and/or standing);Decreased knowledge of use of DME or AE;Decreased coordination      OT Treatment/Interventions: Self-care/ADL training;Therapeutic activities;Therapeutic exercise;DME and/or AE instruction;Patient/family education;Balance training    OT Goals(Current goals can be found in the care plan section) Acute Rehab OT Goals Patient  Stated Goal: get better OT Goal Formulation: With patient Time For Goal Achievement: 11/01/21 Potential to Achieve Goals: Good ADL Goals Pt Will Perform Lower Body Dressing: with modified independence;sit to/from stand Pt Will Transfer to Toilet: with modified independence;ambulating (elevated commode, LRAD) Additional ADL Goal #1: Pt will complete all aspects of bathing, primarily from seated position, with remote supervision and AE PRN, 2/2 opportunities.  OT Frequency: Min 2X/week    Co-evaluation              AM-PAC OT "6 Clicks" Daily Activity     Outcome Measure Help from another person eating meals?:  None Help from another person taking care of personal grooming?: A Little Help from another person toileting, which includes using toliet, bedpan, or urinal?: A Little Help from another person bathing (including washing, rinsing, drying)?: A Little Help from another person to put on and taking off regular upper body clothing?: None Help from another person to put on and taking off regular lower body clothing?: A Little 6 Click Score: 20   End of Session Equipment Utilized During Treatment: Rolling walker (2 wheels)  Activity Tolerance: Patient tolerated treatment well Patient left: in bed;with call bell/phone within reach;with bed alarm set;with family/visitor present  OT Visit Diagnosis: Other abnormalities of gait and mobility (R26.89);Muscle weakness (generalized) (M62.81)                Time: 2831-5176 OT Time Calculation (min): 14 min Charges:  OT General Charges $OT Visit: 1 Visit OT Evaluation $OT Eval Low Complexity: 1 Low OT Treatments $Self Care/Home Management : 8-22 mins  Ardeth Perfect., MPH, MS, OTR/L ascom [phone] 10/18/21, 4:02 PM

## 2021-10-18 NOTE — Progress Notes (Signed)
Progress Note   Patient: Dustin Wells LOV:[id number] DOB: October 01, 1965 DOA: 10/15/2021     3 DOS: the patient was seen and examined on 10/18/2021   Brief hospital course: Taken from H&P.  Dustin Wells is a 56 y.o. male with medical history significant of hypertension, hyperlipidemia, prediabetes, GERD, gout, depression with anxiety, testicular cancer, diastolic CHF, atrial fibrillation on Eliquis, CKD-3B, alcohol abuse, OSA on CPAP, obesity with BMI 35.94, syncope, thrombocytopenia, who presents with altered mental status, shortness breath, abdominal pain and distention, fever.   Patient was recently hospitalized from 7/26-7/27 due to syncope.  Patient had MRI for brain on 7/27 which showed chronic right MCA infarction, but no acute intracranial abnormalities.  Patient had 2D echo on 7/27 which showed EF of 55-60%.  His syncope was thought possibly due to hypovolemia.  Patient was treated with IV fluid with improvement.   Pt is confused, but still oriented x 3.  Patient cannot provide accurate medical history.  He moves all extremities normally.  No facial droop or slurred speech.  Per her wife at the bedside, has been confused since this AM.  Patient complains of shortness breath and mild dry cough, but denies chest pain.  He has fever, no chills.  His temperature is 100.6 in ED.  Patient does not have nausea, vomiting, diarrhea, but he has some central abdominal pain and abdominal distention.  Per his wife, patient had incontinence of bladder or bowel movement at home.  Denies symptoms of UTI.   Patient not using oxygen normally.  Patient has oxygen desaturation to 85% on room air, with respiratory distress and tachypnea, requiring 3L oxygen with 96% saturation now   Data reviewed independently and ED Course: pt was found to have WBC 8.7, lactic acid 1.6, INR 1.5, PTT 37, lipase 78, abnormal liver function (ALP 66, AST 78, ALT 64, total bilirubin 1.7), negative urinalysis, PCR negative, renal function has  been improving from recent level, temperature 100.6, blood pressure 147/83, heart rate 108, RR 33, chest x-ray showed cardiomegaly without infiltration. CT-head showed stable remote posterior right MCA territory infarct and no acute intracranial abnormality.  EKG:  Sinus rhythm, QTc 457, LAD, low voltage.  7/29: LP was done for concern of meningitis and it shows elevated leukocytosis at 804, protein of 103 and glucose of 54.  Met sepsis criteria, ID was consulted.  Antibiotics switched to Rocephin 2 g twice daily.  ID also recommended to check CSF syphilis and cryptococcal antigen.  He is also on Unasyn, vancomycin and acyclovir. Cryptococcal antigen negative, procalcitonin at 1.12, renal function with some improvement as compared to recent hospitalization, creatinine at 2.09 which is close to his baseline.  Preliminary CSF cultures negative.  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 HSV and VDRL pending.  Fungal cultures pending. ID advised to start him on ganciclovir. HIV labs are pending as HHV-6 encephalitis is more common in immunocompromised.  7/30: Patient remained stable.  Currently on ganciclovir and Teflaro.  MRI brain was done yesterday by ID and it was negative for any acute intracranial abnormalities or significant interval changes.  Did show stable encephalomalacia of right posterior temporal and parietal lobe.  There was Scattered subcortical T2 hyperintensities bilaterally are mildly advanced for age. The finding is nonspecific but can be seen in the setting of chronic microvascular ischemia, a demyelinating process such as multiple sclerosis, vasculitis, complicated migraine headaches, or as the sequelae of a prior infectious or inflammatory process. CSF cultures remain negative for any bacterial growth. ID  sent quantitative PCR for HHV-6 as advised by ID at [facility], Dr. Frances Maywood, as HHV-6 encephalitis is very rare in immunocompetent patients.  HIV, VDRL and RPR labs are  pending.  7/31: Patient remained stable.  RPR negative, HSV 1-2 negative, fungal cultures negative.  Bacterial culture with no growth.  Rest of the labs still pending. Per ID patient will be in hospital for the next couple of days as they would like all the labs back before making final decision in terms of antiviral management.   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 Mental status improved Please see below for further information and management.  Sepsis [facility]) Patient meets criteria for sepsis with fever 100.6, heart rate of 108, RR 33.  Lactic acid 1.6.   Since patient has altered mental status, will need to rule out meningitis. LP is performed by IR.  Which shows leukocytosis with 75% neutrophils, elevated protein and borderline low glucose. Meningitis/encephalitis panel positive for HH V6.  Which is very rare to cause encephalitis in immunocompetent patients.  Patient is a chronic alcoholic, never received any chemotherapy, only radiation therapy and on clomiphene for testicular cancer. Fungal cultures negative, HSV 1-2 negative, RPR negative.  HHV-6 quantitative PCR was also sent. Patient initially received broad-spectrum antibiotics with multiple agents which include cefepime followed by ceftriaxone, Augmentin, acyclovir and vancomycin. ID is on board -Currently on ganciclovir and ceftaroline          Acute metabolic encephalopathy Resolved.  Appears to be at baseline now. 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  MRI brain was also without any acute abnormality.  Multiple chronic hypodense lesions which can be due to some prior infection/inflammation or a demyelinating process. -Continue to monitor -Infection management as below  Acute respiratory failure with hypoxia (HCC) Resolved.  Patient is now on room  air. Etiology is not clear.  May be secondary to sepsis and encephalitis.  Chest x-ray negative for effusion.  COVID PCR negative.  Patient is taking Eliquis, less likely to have PE.  Patient has history of diastolic CHF, but due to obesity, it is very difficult to assess volume status.  BNP at 256.  Patient did received IV fluid per sepsis protocol. He was given some IV Lasix for 1 day-appears euvolemic with slight worsening of his anemia.  Creatinine improving. -Bronchodilators  Chronic diastolic CHF (congestive heart failure) ([place]) 2D echo on 10/14/2021 showed EF 55-60%. Clinically appears euvolemic.  Mild worsening of azotemia, creatinine seems improving.  Received some IV Lasix. -Continue to monitor.  Alcohol use disorder, severe, dependence ([place]) - CIWA protocol  Atrial fibrillation, chronic (HCC) - Continue flecainide 100 mg twice daily -Restarted Eliquis-initially held for LP  HTN (hypertension) Blood pressure mostly within goal. -Keep holding Cozaar and amlodipine -IV hydralazine as needed  Stage 3b chronic kidney disease (CKD) ([place]) Improving with creatinine at 1.90 , at baseline. Patient had worsening kidney function in recent admission, creatinine was up to 4.17 on 10/13/2021, which improved to 3.19 at discharge.   -Follow-up renal function by BMP  Testicular cancer ([place]) -on clomiphene  Depression with anxiety - Continue home as needed Ativan which also help alcohol withdrawal  HLD (hyperlipidemia) - Lipitor  Thrombocytopenia ([place]) This is a chronic issue.  Platelets are 129 (91 on 10/14/2021), likely due to alcohol abuse.  No active bleeding. -Follow-up by CBC  OSA (obstructive sleep apnea) -CPAP at  night  Obesity with body mass index of 30.0-39.9  BMI= 35.94  and BW= 120.2 -Diet and exercise.   -Encourage to lose weight.   Abdominal distention Pt has central abdominal tenderness and abdominal distention, etiology is not clear.  Lipase 78 CT abdomen and  pelvis with following impression There is no evidence of intestinal obstruction or pneumoperitoneum. There is no hydronephrosis. Appendix is not dilated.  There is patchy infiltrate in left lower lung field suggesting atelectasis/pneumonia.  There is 1.3 cm soft tissue density structure in sigmoid colon which may suggest fecal material in the lumen or polyp. When the patient's clinical condition permits, endoscopy may be considered.  There is perinephric stranding around both kidneys which may be due to acute or chronic pyelonephritis or residual change from previous ureteric obstruction.  Small hiatal hernia.Diverticulosis of colon without signs of focal diverticulitis. Enlarged spleen. -Supportive care  Abnormal LFTs Patient has mild abnormal liver function with AST 78, ALT 64, total bilirubin 1.7, ALP 66.  Likely due to alcohol abuse and ongoing infection. -Judicious use Tylenol (patient cannot use NSAID for fever due to CKD)   Subjective: Patient was seen and examined today.  No new complaints.  Brother at bedside.  He was asking about going home.  Physical Exam: Vitals:   10/18/21 0450 10/18/21 0500 10/18/21 0834 10/18/21 1223  BP: 130/81  136/84 122/84  Pulse: 60  76 87  Resp: 16  16 20   Temp: 97.8 F (36.6 C)  98.1 F (36.7 C) 97.6 F (36.4 C)  TempSrc: Oral     SpO2: 95%  93% 93%  Weight:  117.5 kg     General.  Obese gentleman, in no acute distress. Pulmonary.  Lungs clear bilaterally, normal respiratory effort. CV.  Regular rate and rhythm, no JVD, rub or murmur. Abdomen.  Soft, nontender, nondistended, BS positive. CNS.  Alert and oriented .  No focal neurologic deficit. Extremities.  No edema, no cyanosis, pulses intact and symmetrical. Psychiatry.  Judgment and insight appears normal.  Data Reviewed: Prior data reviewed.  Family Communication: Discussed with brother at bedside  Disposition: Status is: Inpatient Remains inpatient appropriate  because: Severity of illness   Planned Discharge Destination: Home with Home Health  DVT prophylaxis.  Eliquis Time spent: 45 minutes  This record has been created using Systems analyst. Errors have been sought and corrected,but may not always be located. Such creation errors do not reflect on the standard of care.  Author: Lorella Nimrod, MD 10/18/2021 3:10 PM  For on call review [email or web address].

## 2021-10-19 ENCOUNTER — Encounter (HOSPITAL_COMMUNITY): Payer: Self-pay

## 2021-10-19 DIAGNOSIS — R4182 Altered mental status, unspecified: Secondary | ICD-10-CM

## 2021-10-19 DIAGNOSIS — R569 Unspecified convulsions: Secondary | ICD-10-CM

## 2021-10-19 DIAGNOSIS — G0490 Encephalitis and encephalomyelitis, unspecified: Secondary | ICD-10-CM | POA: Diagnosis not present

## 2021-10-19 LAB — BASIC METABOLIC PANEL
Anion gap: 7 (ref 5–15)
BUN: 33 mg/dL — ABNORMAL HIGH (ref 6–20)
CO2: 26 mmol/L (ref 22–32)
Calcium: 9.2 mg/dL (ref 8.9–10.3)
Chloride: 105 mmol/L (ref 98–111)
Creatinine, Ser: 1.68 mg/dL — ABNORMAL HIGH (ref 0.61–1.24)
GFR, Estimated: 48 mL/min — ABNORMAL LOW (ref >60)
Glucose, Bld: 92 mg/dL (ref 70–99)
Potassium: 4 mmol/L (ref 3.5–5.1)
Sodium: 138 mmol/L (ref 135–145)

## 2021-10-19 LAB — CBC WITH DIFFERENTIAL/PLATELET
Abs Immature Granulocytes: 0.05 10*3/uL (ref 0.00–0.07)
Basophils Absolute: 0 10*3/uL (ref 0.0–0.1)
Basophils Relative: 1 %
Eosinophils Absolute: 0.1 10*3/uL (ref 0.0–0.5)
Eosinophils Relative: 2 %
HCT: 39.5 % (ref 39.0–52.0)
Hemoglobin: 13 g/dL (ref 13.0–17.0)
Immature Granulocytes: 1 %
Lymphocytes Relative: 24 %
Lymphs Abs: 1.7 10*3/uL (ref 0.7–4.0)
MCH: 30.5 pg (ref 26.0–34.0)
MCHC: 32.9 g/dL (ref 30.0–36.0)
MCV: 92.7 fL (ref 80.0–100.0)
Monocytes Absolute: 0.7 10*3/uL (ref 0.1–1.0)
Monocytes Relative: 10 %
Neutro Abs: 4.3 10*3/uL (ref 1.7–7.7)
Neutrophils Relative %: 62 %
Platelets: 229 10*3/uL (ref 150–400)
RBC: 4.26 MIL/uL (ref 4.22–5.81)
RDW: 14.1 % (ref 11.5–15.5)
WBC: 6.9 10*3/uL (ref 4.0–10.5)
nRBC: 0 % (ref 0.0–0.2)

## 2021-10-19 LAB — MAGNESIUM: Magnesium: 1.9 mg/dL (ref 1.7–2.4)

## 2021-10-19 LAB — APTT: aPTT: 34 seconds (ref 24–36)

## 2021-10-19 LAB — GLUCOSE, CAPILLARY
Glucose-Capillary: 100 mg/dL — ABNORMAL HIGH (ref 70–99)
Glucose-Capillary: 120 mg/dL — ABNORMAL HIGH (ref 70–99)
Glucose-Capillary: 108 mg/dL — ABNORMAL HIGH (ref 70–99)
Glucose-Capillary: 84 mg/dL (ref 70–99)

## 2021-10-19 LAB — C-REACTIVE PROTEIN: CRP: 4.7 mg/dL — ABNORMAL HIGH (ref <1.0)

## 2021-10-19 LAB — CSF CULTURE W GRAM STAIN: Culture: NO GROWTH

## 2021-10-19 LAB — SEDIMENTATION RATE: Sed Rate: 58 mm/hr — ABNORMAL HIGH (ref 0–20)

## 2021-10-19 LAB — VDRL, CSF: VDRL Quant, CSF: NONREACTIVE

## 2021-10-19 LAB — HEPARIN LEVEL (UNFRACTIONATED): Heparin Unfractionated: >1.1 IU/mL — ABNORMAL HIGH (ref 0.30–0.70)

## 2021-10-19 LAB — CORTISOL-AM, BLOOD: Cortisol - AM: 18.3 ug/dL (ref 6.7–22.6)

## 2021-10-19 MED ORDER — METOPROLOL SUCCINATE ER 100 MG PO TB24: 100.0000 mg | ORAL_TABLET | Freq: Every day | ORAL | Status: AC

## 2021-10-19 MED ORDER — LOSARTAN POTASSIUM 100 MG PO TABS: 100.0000 mg | ORAL_TABLET | Freq: Every day | ORAL | Status: DC

## 2021-10-19 MED ORDER — LEVETIRACETAM 500 MG PO TABS: 500.0000 mg | ORAL_TABLET | Freq: Two times a day (BID) | ORAL | Status: DC

## 2021-10-19 MED ORDER — HEPARIN (PORCINE) 25000 UT/250ML-% IV SOLN
1400.0000 [IU]/h | INTRAVENOUS | Status: DC
Start: 2021-10-19 — End: 2021-10-20
  Administered 2021-10-19: 1750 [IU]/h via INTRAVENOUS
  Filled 2021-10-19: qty 250

## 2021-10-19 MED ORDER — LEVETIRACETAM IN NACL 1000 MG/100ML IV SOLN
1000.0000 mg | Freq: Two times a day (BID) | INTRAVENOUS | Status: DC
  Administered 2021-10-20: 1000 mg via INTRAVENOUS
  Filled 2021-10-19 (×2): qty 100

## 2021-10-19 MED ORDER — SODIUM CHLORIDE 0.9 % IV SOLN
2000.0000 mg | Freq: Once | INTRAVENOUS | Status: AC
  Administered 2021-10-19: 2000 mg via INTRAVENOUS
  Filled 2021-10-19: qty 20

## 2021-10-19 MED ORDER — THIAMINE HCL 100 MG/ML IJ SOLN: 100.0000 mg | Freq: Every day | INTRAMUSCULAR | Status: DC

## 2021-10-19 MED ORDER — LORAZEPAM 2 MG/ML IJ SOLN
4.0000 mg | INTRAMUSCULAR | Status: AC
  Administered 2021-10-19: 4 mg via INTRAVENOUS
  Filled 2021-10-19: qty 2

## 2021-10-19 MED ORDER — ADULT MULTIVITAMIN W/MINERALS CH: 1.0000 | ORAL_TABLET | Freq: Every day | ORAL | Status: DC

## 2021-10-19 MED ORDER — LEVETIRACETAM 500 MG/5ML IV SOLN
2000.0000 mg | Freq: Once | INTRAVENOUS | Status: AC
  Administered 2021-10-19: 2000 mg via INTRAVENOUS
  Filled 2021-10-19: qty 20

## 2021-10-19 MED ORDER — SODIUM CHLORIDE 0.9 % IV SOLN: 2.5000 mg/kg | Freq: Two times a day (BID) | INTRAVENOUS | Status: DC

## 2021-10-19 MED ORDER — HEPARIN BOLUS VIA INFUSION
5750.0000 [IU] | Freq: Once | INTRAVENOUS | Status: AC
  Administered 2021-10-19: 5750 [IU] via INTRAVENOUS
  Filled 2021-10-19: qty 5750

## 2021-10-19 MED ORDER — SODIUM CHLORIDE 0.9 % IV SOLN
2.5000 mg/kg | Freq: Two times a day (BID) | INTRAVENOUS | Status: DC
  Administered 2021-10-19 – 2021-10-20 (×2): 290 mg via INTRAVENOUS
  Filled 2021-10-19 (×4): qty 5.8

## 2021-10-19 MED ORDER — LEVETIRACETAM IN NACL 1000 MG/100ML IV SOLN: 1000.0000 mg | Freq: Two times a day (BID) | INTRAVENOUS | Status: DC

## 2021-10-19 MED ORDER — THIAMINE HCL 100 MG PO TABS: 100.0000 mg | ORAL_TABLET | Freq: Every day | ORAL | Status: DC

## 2021-10-19 NOTE — Progress Notes (Signed)
PT Cancellation Note  Patient Details Name: Dustin Wells MRN: [id number] DOB: Jul 10, 1965   Cancelled Treatment:    Reason Eval/Treat Not Completed: Patient at procedure or test/unavailable.  Chart reviewed and attempted to see pt.  Pt currently being visit my MD and is being taken down for testing.  Will re-attempt as medically appropriate at later date/time.   Gwenlyn Saran, PT, DPT 10/19/21, 3:18 PM

## 2021-10-19 NOTE — Progress Notes (Signed)
ID Pt awake and alert Wife at bed side C/o spasms left hand and left side of chest No fever  O/e awake and alert Patient Vitals for the past 24 hrs:  BP Temp Pulse Resp SpO2 Weight  10/19/21 1131 (!) 108/56 97.7 F (36.5 C) 92 18 99 % --  10/19/21 0735 129/78 97.7 F (36.5 C) 77 18 96 % --  10/19/21 0520 125/84 (!) 97.5 F (36.4 C) 73 18 95 % --  10/19/21 0500 -- -- -- -- -- 115.6 kg  10/18/21 2305 131/69 97.8 F (36.6 C) 67 18 92 % --  10/18/21 1952 120/82 (!) 97.4 F (36.3 C) 79 18 94 % --   Cns- left arm weakness Finger nose test- left side some abnormality but could be due to the left arm weakness Gait not examined   Labs    Latest Ref Rng & Units 10/16/2021    7:23 AM 10/15/2021   10:23 AM 10/15/2021    9:49 AM  CBC  WBC 4.0 - 10.5 K/uL 10.9  8.6  8.7   Hemoglobin 13.0 - 17.0 g/dL 12.8  13.1  13.5   Hematocrit 39.0 - 52.0 % 36.9  37.8  40.2   Platelets 150 - 400 K/uL 129  107  128        Latest Ref Rng & Units 10/19/2021    5:11 AM 10/17/2021   10:20 AM 10/16/2021    7:23 AM  CMP  Glucose 70 - 99 mg/dL 92  195  181   BUN 6 - 20 mg/dL 33  41  29   Creatinine 0.61 - 1.24 mg/dL 1.68  1.95  2.09   Sodium 135 - 145 mmol/L 138  134  137   Potassium 3.5 - 5.1 mmol/L 4.0  3.4  3.7   Chloride 98 - 111 mmol/L 105  97  102   CO2 22 - 32 mmol/L '26  25  21   '$ Calcium 8.9 - 10.3 mg/dL 9.2  8.8  8.5     MICRO Blood culture NG CSF culturwe Ng so far CSF meningitis /encephalitis panel- HHV6 Csf- protein 105 Csf cell count 1 tube only 804 ( 70% N) Cryptococcal antigen neg HIV RNA < 20 CSF VDRL NR Serum RPR NR   Impression/recommendation  56 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 meningitis /encephalitis panel positive for only HHV6,  Acyclovir,vanco, amp Dc Ganciclovir started  Ceftaroline continued Pt was alert and oriented by next day  Bacterial meningitis or encephalitis seemed unlikely HHV6 in csf was unusual as he was not immunecompromised currently HHV6 quantitative PCR was sent both in csf and blood an ganciclovir continued and also ceftaroline was changed to meropenem D.D Bacterial meningitis less likely-    Viral infections- enterovirus, WNV remotely possible- will see whether we have enough csf to test HHV6 came positive-  Await csf HHV6 PCR Currently on ganciclovir   R/o paraneoplastic Autoimmune Malignant Seizures Normal pressure hydrocephalus  Neuro saw the patient today and I discussed with her She is mainly Concerned about seizure - etiology of seizure multifactorial- alcohol withdrawal, with previous cva and irritable focus and possible UTI ( see her comprehensive and very helpful  note)  Eventhough patient had perinephric stranding around both kidneys, UA had no WBC , urine culture normal. There was blood in the urine and it is possible that he could have passed stones Anyway he has been given 4 days of IV antibiotics which is likely sufficient if he had an infection  The plan was to get EEG, stop antibiotic and antiviral Discussed the management with patient , wife and neurologist   addendum EEG showed multiple  seizures rt parieto occipital region- started antiseizure meds and plan for [facility] for Continuous monitoring decided to continue ganciclovir until HHV6 PCR is back DC meropenem as increase in seizure potential As wbc from 8/1 normal and neg blood, csf,  urine culture no antibiotics needed

## 2021-10-19 NOTE — Procedures (Signed)
Patient Name: Dustin Wells  MRN: [id number]  Epilepsy Attending: Lora Havens  Referring Physician/Provider: Lorenza Chick, MD  Date: 10/19/2021 Duration: 29.57 mins  Patient history: 56 year old male with altered mental status.  EEG to evaluate for seizure.  Level of alertness: Awake  AEDs during EEG study: None  Technical aspects: This EEG study was done with scalp electrodes positioned according to the 10-20 International system of electrode placement. Electrical activity was reviewed with band pass filter of 1-'70Hz'$ , sensitivity of 7 uV/mm, display speed of 39m/sec with a '60Hz'$  notched filter applied as appropriate. EEG data were recorded continuously and digitally stored.  Video monitoring was available and reviewed as appropriate.  Description: The posterior dominant rhythm consists of 9-10 Hz activity of moderate voltage (25-35 uV) seen predominantly in posterior head regions, symmetric and reactive to eye opening and eye closing. Periodic discharges with overriding fast activity were noted in right parieto-occipital region at '1hz'$  which at times appeared rhythmic lasting 2-5 seconds consistent with brief-ictal-interictal rhythmic discharges. Six electrographic seizures were also noted during which EEG showed evolution in morphology, frequency and involved vertex region. No clinical signs were noted. Average duration 30-45 seconds.  Last seizure was at 1546. Hyperventilation and photic stimulation were not performed.     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 There is also evidence of epileptogenicity with increased risk of seizure recurrence. Consider long term  eeg monitoring.   Dr. BCurly Shoreswas notified.  Ritta Hammes OBarbra Sarks

## 2021-10-19 NOTE — Progress Notes (Signed)
Eeg done 

## 2021-10-19 NOTE — Assessment & Plan Note (Signed)
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 Mental status improved Please see below for further information and management.

## 2021-10-19 NOTE — Assessment & Plan Note (Signed)
Patient meets criteria for sepsis with fever 100.6, heart rate of 108, RR 33.  Lactic acid 1.6.   Since patient has altered mental status, will need to rule out meningitis. LP is performed by IR.  Which shows leukocytosis with 75% neutrophils, elevated protein and borderline low glucose. Meningitis/encephalitis panel positive for HH V6.  Which is very rare to cause encephalitis in immunocompetent patients.  Patient is a chronic alcoholic, never received any chemotherapy, only radiation therapy and on clomiphene for testicular cancer. Fungal cultures negative, HSV 1-2 negative, RPR negative.  HHV-6 quantitative PCR was also sent. Patient initially received broad-spectrum antibiotics with multiple agents which include cefepime followed by ceftriaxone, Augmentin, acyclovir and vancomycin. ID is on board -Currently on ganciclovir and ceftaroline

## 2021-10-19 NOTE — Consult Note (Signed)
ANTICOAGULATION CONSULT NOTE - Initial Consult  Pharmacy Consult for heparin infusion Indication: atrial fibrillation  Allergies  Allergen Reactions   Rivaroxaban Diarrhea, Nausea Only and Other (See Comments)    Nausea, diarrhea, abdominal pain Nausea, diarrhea, abdominal pain Nausea, diarrhea, abdominal pain    Sulfamethoxazole-Trimethoprim Rash    Patient Measurements: Weight: 115.6 kg (254 lb 12.8 oz) Heparin Dosing Weight: 115.6kg  Vital Signs: Temp: 98 F (36.7 C) (08/01 1655) Temp Source: Oral (08/01 1655) BP: 119/83 (08/01 1655) Pulse Rate: 85 (08/01 1655)  Labs: Recent Labs    10/17/21 1020 10/19/21 0511 10/19/21 1634  HGB  --   --  13.0  HCT  --   --  39.5  PLT  --   --  229  CREATININE 1.95* 1.68*  --     Estimated Creatinine Clearance: 66.7 mL/min (A) (by C-G formula based on SCr of 1.68 mg/dL (H)).   Medical History: Past Medical History:  Diagnosis Date   Atrial fibrillation (HCC)    CHF (congestive heart failure) ([place])    Hypertension    Renal disorder    Stroke [facility])    Testicular cancer (HCC)     Medications:  PTA: Eliquis '5mg'$  BID (last dose 8/1 at 9 AM) Inpatient: 7/28 Eliquis > 8/1 > Heparin infusion > Allergies: Pat reports xarelto causing Nausea, diarrhea, abdominal pain  Assessment: 56 year old male  with EE concerning for subclinical seizures. Pharmacy consulted to transition apixaban to heparin drip in case repeat lumbar puncture to confirm no HSV is needed  Goal of Therapy:  Heparin level 0.3-0.7 units/ml aPTT 66-102 seconds Monitor platelets by anticoagulation protocol: Yes  Date Time aPTT/HL Rate/Comment  Plan:  Order STAT aPTT and heparin level Give IV heparin 5750 units bolus x1; then start heparin infusion at 1750 units/hr Check aPTT/Anti-Xa level in 6 hours and daily once consecutively therapeutic.  Titrate by aPTT's until lab correlation is noted, then titrate by anti-xa alone. Continue to monitor H&H and  platelets daily while on heparin gtt.          Darrick Penna 10/19/2021,5:27 PM

## 2021-10-19 NOTE — Assessment & Plan Note (Signed)
Resolved.  Patient is now on room air. Etiology is not clear.  May be secondary to sepsis and encephalitis.  Chest x-ray negative for effusion.  COVID PCR negative.  Patient is taking Eliquis, less likely to have PE.  Patient has history of diastolic CHF, but due to obesity, it is very difficult to assess volume status.  BNP at 256.  Patient did received IV fluid per sepsis protocol. He was given some IV Lasix for 1 day-appears euvolemic  -Bronchodilators

## 2021-10-19 NOTE — Progress Notes (Addendum)
Progress Note   Patient: Dustin Wells BZJ:[id number] DOB: 02-Sep-1965 DOA: 10/15/2021     4 DOS: the patient was seen and examined on 10/19/2021   Brief hospital course: Taken from H&P.  Dustin Wells is a 56 y.o. male with medical history significant of hypertension, hyperlipidemia, prediabetes, GERD, gout, depression with anxiety, testicular cancer, diastolic CHF, atrial fibrillation on Eliquis, CKD-3B, alcohol abuse, OSA on CPAP, obesity with BMI 35.94, syncope, thrombocytopenia, who presents with altered mental status, shortness breath, abdominal pain and distention, fever.   Patient was recently hospitalized from 7/26-7/27 due to syncope.  Patient had MRI for brain on 7/27 which showed chronic right MCA infarction, but no acute intracranial abnormalities.  Patient had 2D echo on 7/27 which showed EF of 55-60%.  His syncope was thought possibly due to hypovolemia.  Patient was treated with IV fluid with improvement.   Pt is confused, but still oriented x 3.  Patient cannot provide accurate medical history.  He moves all extremities normally.  No facial droop or slurred speech.  Per her wife at the bedside, has been confused since this AM.  Patient complains of shortness breath and mild dry cough, but denies chest pain.  He has fever, no chills.  His temperature is 100.6 in ED.  Patient does not have nausea, vomiting, diarrhea, but he has some central abdominal pain and abdominal distention.  Per his wife, patient had incontinence of bladder or bowel movement at home.  Denies symptoms of UTI.   Patient not using oxygen normally.  Patient has oxygen desaturation to 85% on room air, with respiratory distress and tachypnea, requiring 3L oxygen with 96% saturation now   Data reviewed independently and ED Course: pt was found to have WBC 8.7, lactic acid 1.6, INR 1.5, PTT 37, lipase 78, abnormal liver function (ALP 66, AST 78, ALT 64, total bilirubin 1.7), negative urinalysis, PCR negative, renal function has  been improving from recent level, temperature 100.6, blood pressure 147/83, heart rate 108, RR 33, chest x-ray showed cardiomegaly without infiltration. CT-head showed stable remote posterior right MCA territory infarct and no acute intracranial abnormality.  EKG:  Sinus rhythm, QTc 457, LAD, low voltage.  7/29: LP was done for concern of meningitis and it shows elevated leukocytosis at 804, protein of 103 and glucose of 54.  Met sepsis criteria, ID was consulted.  Antibiotics switched to Rocephin 2 g twice daily.  ID also recommended to check CSF syphilis and cryptococcal antigen.  He is also on Unasyn, vancomycin and acyclovir. Cryptococcal antigen negative, procalcitonin at 1.12, renal function with some improvement as compared to recent hospitalization, creatinine at 2.09 which is close to his baseline.  Preliminary CSF cultures negative.  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 HSV and VDRL pending.  Fungal cultures pending. ID advised to start him on ganciclovir. HIV labs are pending as HHV-6 encephalitis is more common in immunocompromised.  7/30: Patient remained stable.  Currently on ganciclovir and Teflaro.  MRI brain was done yesterday by ID and it was negative for any acute intracranial abnormalities or significant interval changes.  Did show stable encephalomalacia of right posterior temporal and parietal lobe.  There was Scattered subcortical T2 hyperintensities bilaterally are mildly advanced for age. The finding is nonspecific but can be seen in the setting of chronic microvascular ischemia, a demyelinating process such as multiple sclerosis, vasculitis, complicated migraine headaches, or as the sequelae of a prior infectious or inflammatory process. CSF cultures remain negative for any bacterial growth. ID  sent quantitative PCR for HHV-6 as advised by ID at [facility], Dr. Frances Maywood, as HHV-6 encephalitis is very rare in immunocompetent patients.  HIV, VDRL and RPR labs are  pending.  7/31: Patient remained stable.  RPR negative, HSV 1-2 negative, fungal cultures negative.  Bacterial culture with no growth.  Rest of the labs still pending. Per ID patient will be in hospital for the next couple of days as they would like all the labs back before making final decision in terms of antiviral management.  8/1: Patient remained stable.  Outside labs remained pending.  Awaiting neurology evaluation before deciding about the course of antiviral and antibiotics.  Preliminary repeat blood cultures negative.  A.m. cortisol levels were low at 6.2 yesterday, repeating it again today if remains low then we might have to do ACTH stim test. Renal functions improving.   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 Mental status improved Please see below for further information and management.  Sepsis [facility]) Patient meets criteria for sepsis with fever 100.6, heart rate of 108, RR 33.  Lactic acid 1.6.   Since patient has altered mental status, will need to rule out meningitis. LP is performed by IR.  Which shows leukocytosis with 75% neutrophils, elevated protein and borderline low glucose. Meningitis/encephalitis panel positive for HH V6.  Which is very rare to cause encephalitis in immunocompetent patients.  Patient is a chronic alcoholic, never received any chemotherapy, only radiation therapy and on clomiphene for testicular cancer. Fungal cultures negative, HSV 1-2 negative, RPR negative.  HHV-6 quantitative PCR was also sent. Patient initially received broad-spectrum antibiotics with multiple agents which include cefepime followed by ceftriaxone, Augmentin, acyclovir and vancomycin. ID is on board -Currently on ganciclovir and ceftaroline          Acute metabolic encephalopathy Resolved.  Appears to be at baseline now. 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  MRI brain was also without any acute abnormality.  Multiple chronic hypodense lesions which can be due to some prior infection/inflammation or a demyelinating process. -Continue to monitor -Infection management as below  Acute respiratory failure with hypoxia (HCC) Resolved.  Patient is now on room air. Etiology is not clear.  May be secondary to sepsis and encephalitis.  Chest x-ray negative for effusion.  COVID PCR negative.  Patient is taking Eliquis, less likely to have PE.  Patient has history of diastolic CHF, but due to obesity, it is very difficult to assess volume status.  BNP at 256.  Patient did received IV fluid per sepsis protocol. He was given some IV Lasix for 1 day-appears euvolemic  -Bronchodilators  Chronic diastolic CHF (congestive heart failure) ([place]) 2D echo on 10/14/2021 showed EF 55-60%. Clinically appears euvolemic.  Mild worsening of azotemia, creatinine seems improving.  Received some IV Lasix. -Continue to monitor.  Alcohol use disorder, severe, dependence ([place]) - CIWA protocol  Atrial fibrillation, chronic (HCC) - Continue flecainide 100 mg twice daily -Restarted Eliquis-initially held for LP  HTN (hypertension) Blood pressure mostly within goal. -Keep holding Cozaar and amlodipine -IV hydralazine as needed  Stage 3b chronic kidney disease (CKD) ([place]) Improving with creatinine at 1.90 , at baseline. Patient had worsening kidney function in recent admission, creatinine was up to 4.17 on 10/13/2021, which improved to 3.19 at discharge.   -Follow-up renal function by BMP  Testicular cancer ([place]) -on clomiphene  Depression with anxiety -  Continue home as needed Ativan which also help alcohol withdrawal  HLD (hyperlipidemia) - Lipitor  Thrombocytopenia ([place]) This is a chronic issue.  Platelets are 129 (91 on 10/14/2021), likely due to alcohol abuse.  No active bleeding. -Follow-up by CBC  OSA  (obstructive sleep apnea) -CPAP at night  Obesity with body mass index of 30.0-39.9  BMI= 35.94  and BW= 120.2 -Diet and exercise.   -Encourage to lose weight.   Abdominal distention Pt has central abdominal tenderness and abdominal distention, etiology is not clear.  Lipase 78 CT abdomen and pelvis with following impression There is no evidence of intestinal obstruction or pneumoperitoneum. There is no hydronephrosis. Appendix is not dilated.   There is patchy infiltrate in left lower lung field suggesting atelectasis/pneumonia.   There is 1.3 cm soft tissue density structure in sigmoid colon which may suggest fecal material in the lumen or polyp. When the patient's clinical condition permits, endoscopy may be considered.   There is perinephric stranding around both kidneys which may be due to acute or chronic pyelonephritis or residual change from previous ureteric obstruction.   Small hiatal hernia.Diverticulosis of colon without signs of focal diverticulitis. Enlarged spleen. -Supportive care  Abnormal LFTs Patient has mild abnormal liver function with AST 78, ALT 64, total bilirubin 1.7, ALP 66.  Likely due to alcohol abuse and ongoing infection. -Judicious use Tylenol (patient cannot use NSAID for fever due to CKD)   Subjective: Patient was seen and examined today.  No new complaints.  Eating and drinking okay.  Awaiting neurologic evaluation by a neurologist.  Physical Exam: Vitals:   10/19/21 0500 10/19/21 0520 10/19/21 0735 10/19/21 1131  BP:  125/84 129/78 (!) 108/56  Pulse:  73 77 92  Resp:  18 18 18   Temp:  (!) 97.5 F (36.4 C) 97.7 F (36.5 C) 97.7 F (36.5 C)  TempSrc:      SpO2:  95% 96% 99%  Weight: 115.6 kg      General.  Obese gentleman, in no acute distress. Pulmonary.  Lungs clear bilaterally, normal respiratory effort. CV.  Regular rate and rhythm, no JVD, rub or murmur. Abdomen.  Soft, nontender, nondistended, BS positive. CNS.  Alert and  oriented .  No focal neurologic deficit. Extremities.  No edema, no cyanosis, pulses intact and symmetrical. Psychiatry.  Judgment and insight appears normal.  Data Reviewed: Prior data reviewed  Family Communication: Discussed with wife at bedside  Disposition: Status is: Inpatient Remains inpatient appropriate because: Severity of illness,   Planned Discharge Destination: Home  DVT prophylaxis.  Eliquis Time spent: 40 minutes  This record has been created using Systems analyst. Errors have been sought and corrected,but may not always be located. Such creation errors do not reflect on the standard of care.  Author: Lorella Nimrod, MD 10/19/2021 2:39 PM  For on call review [email or web address].

## 2021-10-19 NOTE — Plan of Care (Signed)
EEG concerning for subclinical seizures, consistent with focal status   Transfer to [facility] for long-term EEG monitoring to allow appropriate titration of medications for seizure control, appreciate Dr. Reesa Chew assistance Keppra 4000 mg loading dose (ordered) Increase previously planned 500 twice daily to 1000 twice daily Keppra maintenance, maximized for his current renal function (ordered) Continue ganciclovir for now, okay to discontinue other antibiotics per ID (pharmacy consult placed) Transition apixaban to heparin drip in case repeat lumbar puncture to confirm no HSV is needed (pharmacy consult placed)  Lesleigh Noe MD-PhD [facility] [phone] [facility] coverage for [facility] is from 8 AM to 4 AM in-house and 4 PM to 8 PM by telephone/video. 8 PM to 8 AM emergent questions or overnight urgent questions should be addressed to Teleneurology On-call or [facility] neurohospitalist; contact information can be found on AMION  No charge same-day note

## 2021-10-19 NOTE — Assessment & Plan Note (Signed)
Resolved.  Appears to be at baseline now. 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  MRI brain was also without any acute abnormality.  Multiple chronic hypodense lesions which can be due to some prior infection/inflammation or a demyelinating process. -Continue to monitor -Infection management as below

## 2021-10-19 NOTE — Discharge Summary (Signed)
Physician Discharge Summary   Patient: Dustin Wells MRN: [id number] DOB: 02-May-1965  Admit date:     10/15/2021  Discharge date: 10/19/21  Discharge Physician: Lorella Nimrod   PCP: Leonel Ramsay, MD   Recommendations at discharge:  Being transferred to [facility] for continuous EEG for concern of multiple subclinical seizures.  Discharge Diagnoses: Principal Problem:   Encephalitis Active Problems:   Acute metabolic encephalopathy   Sepsis ([place])   Acute respiratory failure with hypoxia (HCC)   Chronic diastolic CHF (congestive heart failure) (HCC)   Alcohol use disorder, severe, dependence (HCC)   Atrial fibrillation, chronic (HCC)   HTN (hypertension)   Stage 3b chronic kidney disease (CKD) (HCC)   Testicular cancer ([place])   Depression with anxiety   HLD (hyperlipidemia)   Thrombocytopenia (HCC)   OSA (obstructive sleep apnea)   Obesity with body mass index of 30.0-39.9   Abnormal LFTs   Abdominal distention  Resolved Problems:   * No resolved hospital problems. [facility] Course: Taken from H&P.  Dustin Wells is a 56 y.o. male with medical history significant of hypertension, hyperlipidemia, prediabetes, GERD, gout, depression with anxiety, testicular cancer, diastolic CHF, atrial fibrillation on Eliquis, CKD-3B, alcohol abuse, OSA on CPAP, obesity with BMI 35.94, syncope, thrombocytopenia, who presents with altered mental status, shortness breath, abdominal pain and distention, fever.   Patient was recently hospitalized from 7/26-7/27 due to syncope.  Patient had MRI for brain on 7/27 which showed chronic right MCA infarction, but no acute intracranial abnormalities.  Patient had 2D echo on 7/27 which showed EF of 55-60%.  His syncope was thought possibly due to hypovolemia.  Patient was treated with IV fluid with improvement.   Pt is confused, but still oriented x 3.  Patient cannot provide accurate medical history.  He moves all extremities normally.  No facial droop  or slurred speech.  Per her wife at the bedside, has been confused since this AM.  Patient complains of shortness breath and mild dry cough, but denies chest pain.  He has fever, no chills.  His temperature is 100.6 in ED.  Patient does not have nausea, vomiting, diarrhea, but he has some central abdominal pain and abdominal distention.  Per his wife, patient had incontinence of bladder or bowel movement at home.  Denies symptoms of UTI.   Patient not using oxygen normally.  Patient has oxygen desaturation to 85% on room air, with respiratory distress and tachypnea, requiring 3L oxygen with 96% saturation now   Data reviewed independently and ED Course: pt was found to have WBC 8.7, lactic acid 1.6, INR 1.5, PTT 37, lipase 78, abnormal liver function (ALP 66, AST 78, ALT 64, total bilirubin 1.7), negative urinalysis, PCR negative, renal function has been improving from recent level, temperature 100.6, blood pressure 147/83, heart rate 108, RR 33, chest x-ray showed cardiomegaly without infiltration. CT-head showed stable remote posterior right MCA territory infarct and no acute intracranial abnormality.  EKG:  Sinus rhythm, QTc 457, LAD, low voltage.  7/29: LP was done for concern of meningitis and it shows elevated leukocytosis at 804, protein of 103 and glucose of 54.  Met sepsis criteria, ID was consulted.  Antibiotics switched to Rocephin 2 g twice daily.  ID also recommended to check CSF syphilis and cryptococcal antigen.  He is also on Unasyn, vancomycin and acyclovir. Cryptococcal antigen negative, procalcitonin at 1.12, renal function with some improvement as compared to recent hospitalization, creatinine at 2.09 which is close to his baseline.  Preliminary CSF cultures negative.  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 HSV and VDRL pending.  Fungal cultures pending. ID advised to start him on ganciclovir. HIV labs are pending as HHV-6 encephalitis is more common in  immunocompromised.  7/30: Patient remained stable.  Currently on ganciclovir and Teflaro.  MRI brain was done yesterday by ID and it was negative for any acute intracranial abnormalities or significant interval changes.  Did show stable encephalomalacia of right posterior temporal and parietal lobe.  There was Scattered subcortical T2 hyperintensities bilaterally are mildly advanced for age. The finding is nonspecific but can be seen in the setting of chronic microvascular ischemia, a demyelinating process such as multiple sclerosis, vasculitis, complicated migraine headaches, or as the sequelae of a prior infectious or inflammatory process. CSF cultures remain negative for any bacterial growth. ID sent quantitative PCR for HHV-6 as advised by ID at [facility], Dr. Frances Maywood, as HHV-6 encephalitis is very rare in immunocompetent patients.  HIV, VDRL and RPR labs are pending.  7/31: Patient remained stable.  RPR negative, HSV 1-2 negative, fungal cultures negative.  Bacterial culture with no growth.  Rest of the labs still pending. Per ID patient will be in hospital for the next couple of days as they would like all the labs back before making final decision in terms of antiviral management.  8/1: Patient remained stable.  Outside labs remained pending.  Awaiting neurology evaluation before deciding about the course of antiviral and antibiotics.  Preliminary repeat blood cultures negative.  A.m. cortisol levels were low at 6.2 yesterday, repeating it again today if remains low then we might have to do ACTH stim test. Renal functions improving.  Addendum.  Neurology evaluated him and for concern of intermittent spasms involving his hand we ordered EEG which was positive for multiple subclinical seizures.  Patient was given Ativan and loaded with Keppra.  Neurology is advising to transfer to [facility] for continuous EEG for better titration of his seizure medications.  Antibiotics were discontinued by ID and he will  continue with ganciclovir at this time.  Patient is being transferred to [facility] for further management and continuous EEG.  He will be going under hospitalist care with a consult to neurology and infectious disease.  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 Mental status improved Please see below for further information and management.  Sepsis [facility]) Patient meets criteria for sepsis with fever 100.6, heart rate of 108, RR 33.  Lactic acid 1.6.   Since patient has altered mental status, will need to rule out meningitis. LP is performed by IR.  Which shows leukocytosis with 75% neutrophils, elevated protein and borderline low glucose. Meningitis/encephalitis panel positive for HH V6.  Which is very rare to cause encephalitis in immunocompetent patients.  Patient is a chronic alcoholic, never received any chemotherapy, only radiation therapy and on clomiphene for testicular cancer. Fungal cultures negative, HSV 1-2 negative, RPR negative.  HHV-6 quantitative PCR was also sent. Patient initially received broad-spectrum antibiotics with multiple agents which include cefepime followed by ceftriaxone, Augmentin, acyclovir and vancomycin. ID is on board -Currently on ganciclovir and ceftaroline          Acute metabolic encephalopathy Resolved.  Appears to be at baseline now. 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  MRI brain was also without any acute abnormality.  Multiple chronic hypodense lesions which can be due  to some prior infection/inflammation or a demyelinating process. -Continue to monitor -Infection management as below  Acute respiratory failure with hypoxia (HCC) Resolved.  Patient is now on room air. Etiology is not clear.  May be secondary to sepsis and encephalitis.  Chest x-ray negative for effusion.  COVID  PCR negative.  Patient is taking Eliquis, less likely to have PE.  Patient has history of diastolic CHF, but due to obesity, it is very difficult to assess volume status.  BNP at 256.  Patient did received IV fluid per sepsis protocol. He was given some IV Lasix for 1 day-appears euvolemic  -Bronchodilators  Chronic diastolic CHF (congestive heart failure) ([place]) 2D echo on 10/14/2021 showed EF 55-60%. Clinically appears euvolemic.  Mild worsening of azotemia, creatinine seems improving.  Received some IV Lasix. -Continue to monitor.  Alcohol use disorder, severe, dependence ([place]) - CIWA protocol  Atrial fibrillation, chronic (HCC) - Continue flecainide 100 mg twice daily -Restarted Eliquis-initially held for LP  HTN (hypertension) Blood pressure mostly within goal. -Keep holding Cozaar and amlodipine -IV hydralazine as needed  Stage 3b chronic kidney disease (CKD) ([place]) Improving with creatinine at 1.90 , at baseline. Patient had worsening kidney function in recent admission, creatinine was up to 4.17 on 10/13/2021, which improved to 3.19 at discharge.   -Follow-up renal function by BMP  Testicular cancer ([place]) -on clomiphene  Depression with anxiety - Continue home as needed Ativan which also help alcohol withdrawal  HLD (hyperlipidemia) - Lipitor  Thrombocytopenia ([place]) This is a chronic issue.  Platelets are 129 (91 on 10/14/2021), likely due to alcohol abuse.  No active bleeding. -Follow-up by CBC  OSA (obstructive sleep apnea) -CPAP at night  Obesity with body mass index of 30.0-39.9  BMI= 35.94  and BW= 120.2 -Diet and exercise.   -Encourage to lose weight.   Abdominal distention Pt has central abdominal tenderness and abdominal distention, etiology is not clear.  Lipase 78 CT abdomen and pelvis with following impression There is no evidence of intestinal obstruction or pneumoperitoneum. There is no hydronephrosis. Appendix is not dilated.   There is patchy  infiltrate in left lower lung field suggesting atelectasis/pneumonia.   There is 1.3 cm soft tissue density structure in sigmoid colon which may suggest fecal material in the lumen or polyp. When the patient's clinical condition permits, endoscopy may be considered.   There is perinephric stranding around both kidneys which may be due to acute or chronic pyelonephritis or residual change from previous ureteric obstruction.   Small hiatal hernia.Diverticulosis of colon without signs of focal diverticulitis. Enlarged spleen. -Supportive care  Abnormal LFTs Patient has mild abnormal liver function with AST 78, ALT 64, total bilirubin 1.7, ALP 66.  Likely due to alcohol abuse and ongoing infection. -Judicious use Tylenol (patient cannot use NSAID for fever due to CKD)   Consultants: Infectious disease, neurology Procedures performed: LP Disposition:  [facility] Diet recommendation:  Discharge Diet Orders (From admission, onward)     Start     Ordered   10/19/21 0000  Diet - low sodium heart healthy        10/19/21 1742           Cardiac and Carb modified diet DISCHARGE MEDICATION: Allergies as of 10/19/2021       Reactions   Rivaroxaban Diarrhea, Nausea Only, Other (See Comments)   Nausea, diarrhea, abdominal pain Nausea, diarrhea, abdominal pain Nausea, diarrhea, abdominal pain   Sulfamethoxazole-trimethoprim Rash  Medication List     STOP taking these medications    diclofenac Sodium 1 % Gel Commonly known as: VOLTAREN       TAKE these medications    acetaminophen 325 MG tablet Commonly known as: TYLENOL Take 650 mg by mouth every 6 (six) hours as needed for mild pain.   allopurinol 300 MG tablet Commonly known as: ZYLOPRIM Take 300 mg by mouth daily.   amLODipine 5 MG tablet Commonly known as: NORVASC Take 5 mg by mouth daily.   atorvastatin 40 MG tablet Commonly known as: LIPITOR Take 40 mg by mouth daily.   Cholecalciferol 25  MCG (1000 UT) tablet Take 1,000 Units by mouth daily.   clomiPHENE 50 MG tablet Commonly known as: CLOMID Take 1/2 tablet daily What changed:  how much to take how to take this when to take this   cyanocobalamin 1000 MCG tablet Take 1,000 mcg by mouth daily.   Eliquis 5 MG Tabs tablet Generic drug: apixaban Take 5 mg by mouth 2 (two) times daily.   flecainide 100 MG tablet Commonly known as: TAMBOCOR Take 100 mg by mouth every 12 (twelve) hours.   folic acid 1 MG tablet Commonly known as: FOLVITE Take by mouth.   gabapentin 300 MG capsule Commonly known as: NEURONTIN Take 1-3 tabs at night   ganciclovir 290 mg in sodium chloride 0.9 % 100 mL Inject 290 mg into the vein every 12 (twelve) hours.   levETIRAcetam 1000 MG/100ML Soln Commonly known as: KEPPRA Inject 100 mLs (1,000 mg total) into the vein every 12 (twelve) hours. Start taking on: October 20, 2021   LORazepam 2 MG tablet Commonly known as: ATIVAN Take 2 mg by mouth at bedtime as needed.   losartan 100 MG tablet Commonly known as: COZAAR Take 1 tablet (100 mg total) by mouth daily. Hold until you see your nephrologist   magnesium oxide 400 (240 Mg) MG tablet Commonly known as: MAG-OX Take 1 tablet by mouth 2 (two) times daily.   metoprolol succinate 100 MG 24 hr tablet Commonly known as: TOPROL-XL Take 1 tablet (100 mg total) by mouth daily. hold What changed: additional instructions   multivitamin with minerals Tabs tablet Take 1 tablet by mouth daily. Start taking on: October 20, 2021   Ozempic (2 MG/DOSE) 8 MG/3ML Sopn Generic drug: Semaglutide (2 MG/DOSE) Inject 2 mg into the skin once a week. Monday   pantoprazole 40 MG tablet Commonly known as: PROTONIX Take 40 mg by mouth daily.   sildenafil 20 MG tablet Commonly known as: REVATIO Take 3-5 tablets 1 hr prior to intercourse as needed What changed:  how much to take how to take this when to take this reasons to take this   sodium  bicarbonate 650 MG tablet Take 650 mg by mouth 2 (two) times daily.   thiamine 100 MG tablet Commonly known as: VITAMIN B1 Take 1 tablet (100 mg total) by mouth daily. Start taking on: October 20, 2021   thiamine 100 MG/ML injection Commonly known as: VITAMIN B1 Inject 1 mL (100 mg total) into the vein daily. Start taking on: October 20, 2021   torsemide 10 MG tablet Commonly known as: DEMADEX Take 1 tablet (10 mg total) by mouth daily. Hold until you see your nephrologist               Durable Medical Equipment  (From admission, onward)           Start     Ordered  10/17/21 1434  For home use only DME Walker rolling  Once       Question Answer Comment  Walker: With Two Strike   Patient needs a walker to treat with the following condition General weakness      10/17/21 1434            Discharge Exam: Filed Weights   10/17/21 0446 10/18/21 0500 11/16/2021 0500  Weight: 123.4 kg 117.5 kg 115.6 kg   General.     In no acute distress. Pulmonary.  Lungs clear bilaterally, normal respiratory effort. CV.  Regular rate and rhythm, no JVD, rub or murmur. Abdomen.  Soft, nontender, nondistended, BS positive. CNS.  Alert and oriented .  No focal neurologic deficit. Extremities.  No edema, no cyanosis, pulses intact and symmetrical. Psychiatry.  Judgment and insight appears normal.   Condition at discharge: stable  The results of significant diagnostics from this hospitalization (including imaging, microbiology, ancillary and laboratory) are listed below for reference.   Imaging Studies: EEG adult  Result Date: 2021/11/16 Lora Havens, MD     11-16-2021  4:36 PM Patient Name: Dustin Wells MRN: [id number] Epilepsy Attending: Lora Havens Referring Physician/Provider: Lorenza Chick, MD Date: November 16, 2021 Duration: 29.57 mins Patient history: 56 year old male with altered mental status.  EEG to evaluate for seizure. Level of alertness: Awake AEDs during EEG study:  None Technical aspects: This EEG study was done with scalp electrodes positioned according to the 10-20 International system of electrode placement. Electrical activity was reviewed with band pass filter of 1-70Hz, sensitivity of 7 uV/mm, display speed of 48m/sec with a 60Hz notched filter applied as appropriate. EEG data were recorded continuously and digitally stored.  Video monitoring was available and reviewed as appropriate. Description: The posterior dominant rhythm consists of 9-10 Hz activity of moderate voltage (25-35 uV) seen predominantly in posterior head regions, symmetric and reactive to eye opening and eye closing. Periodic discharges with overriding fast activity were noted in right parieto-occipital region at 1[place] at times appeared rhythmic lasting 2-5 seconds consistent with brief-ictal-interictal rhythmic discharges. Six electrographic seizures were also noted during which EEG showed evolution in morphology, frequency and involved vertex region. No clinical signs were noted. Average duration 30-45 seconds.  Last seizure was at 1546. Hyperventilation and photic stimulation were not performed.   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 There is also evidence of epileptogenicity with increased risk of seizure recurrence. Consider long term eeg monitoring. Dr. BCurly Shoreswas notified. PLora Havens  MR BRAIN W WO CONTRAST  Result Date: 10/16/2021 CLINICAL DATA:  Meningitis/CNS infection is suspected. Abnormal lumbar puncture. EXAM: MRI HEAD WITHOUT AND WITH CONTRAST TECHNIQUE: Multiplanar, multiecho pulse sequences of the brain and surrounding structures were obtained  without and with intravenous contrast. CONTRAST:  166mGADAVIST GADOBUTROL 1 MMOL/ML IV SOLN COMPARISON:  MR head without contrast 10/14/2021. FINDINGS: Brain: Chronic encephalomalacia right posterior temporal and parietal lobe is stable. Volume loss is noted with ex vacuo dilation the right lateral ventricle. Scattered subcortical T2 hyperintensities are otherwise mildly advanced for age. Left lateral ventricle is within normal limits. No significant extraaxial fluid collection is present. The internal auditory canals are within normal limits. The brainstem and cerebellum are within normal limits. Postcontrast images demonstrate no pathologic enhancement. No focal  dural thickening or leptomeningeal enhancement is present. Vascular: Flow is present in the major intracranial arteries. Skull and upper cervical spine: The craniocervical junction is normal. Upper cervical spine is within normal limits. Marrow signal is unremarkable. Sinuses/Orbits: A polyp mucous retention cyst is present posterior right maxillary sinus. Left greater than right mastoid effusions are present. No obstructing nasopharyngeal lesion is present. The paranasal sinuses and mastoid air cells are otherwise clear. IMPRESSION: 1. No acute intracranial abnormality or significant interval change. 2. Stable encephalomalacia of the right posterior temporal and parietal lobe. 3. Scattered subcortical T2 hyperintensities bilaterally are mildly advanced for age. The finding is nonspecific but can be seen in the setting of chronic microvascular ischemia, a demyelinating process such as multiple sclerosis, vasculitis, complicated migraine headaches, or as the sequelae of a prior infectious or inflammatory process. Electronically Signed   By: San Morelle M.D.   On: 10/16/2021 19:51   CT ABDOMEN PELVIS WO CONTRAST  Result Date: 10/15/2021 CLINICAL DATA:  Abdominal pain EXAM: CT ABDOMEN AND PELVIS WITHOUT CONTRAST TECHNIQUE: Multidetector CT  imaging of the abdomen and pelvis was performed following the standard protocol without IV contrast. RADIATION DOSE REDUCTION: This exam was performed according to the departmental dose-optimization program which includes automated exposure control, adjustment of the mA and/or kV according to patient size and/or use of iterative reconstruction technique. COMPARISON:  None Available. FINDINGS: Lower chest: Breathing motion artifacts limit evaluation. There is patchy infiltrate in left lower lung field at the left cardiophrenic angle. There are small linear densities in the lateral aspect of left lower lobe. Hepatobiliary: Breathing motion artifacts limit evaluation. As far as seen, no focal abnormalities are noted in the liver. There is no dilation of bile ducts. Gallbladder is not distended. Pancreas: No focal abnormalities are seen. Spleen: Spleen measures 16.5 cm in AP diameter. Adrenals/Urinary Tract: Adrenals are unremarkable. There is no hydronephrosis. There is perinephric stranding around both kidneys which may be related to acute or chronic pyelonephritis or previous ureteric obstruction. There is no loculated perinephric fluid collection. There are no renal or ureteral stones. Urinary bladder is unremarkable. Stomach/Bowel: Small hiatal hernia is seen. Small bowel loops are not dilated. Appendix is not dilated. There is no significant wall thickening in colon. Diverticula are seen in the colon without signs of focal acute diverticulitis. In image 75 of series 2, there is 1.3 cm soft tissue density structure in the posterior margin of the sigmoid colon. Vascular/Lymphatic: Scattered arterial calcifications are seen. Reproductive: Unremarkable. Other: There is no ascites or pneumoperitoneum. Umbilical hernia containing fat is seen. Small right inguinal hernia containing fat is seen. Musculoskeletal: No acute findings are seen. IMPRESSION: There is no evidence of intestinal obstruction or pneumoperitoneum.  There is no hydronephrosis. Appendix is not dilated. There is patchy infiltrate in left lower lung field suggesting atelectasis/pneumonia. There is 1.3 cm soft tissue density structure in sigmoid colon which may suggest fecal material in the lumen or polyp. When the patient's clinical condition permits, endoscopy may be considered. There is perinephric stranding around both kidneys which may be due to acute or chronic pyelonephritis or residual change from previous ureteric obstruction. Small hiatal hernia.Diverticulosis of colon without signs of focal diverticulitis. Enlarged spleen. Other findings as described in the body of the report. Electronically Signed   By: Elmer Picker M.D.   On: 10/15/2021 17:01   DG Lumbar Puncture Fluoro Guide  Result Date: 10/15/2021 CLINICAL DATA:  Patient presented to ED for significant weakness with fevers and altered mental  status. Request for fluoroscopic guided lumbar puncture. EXAM: LUMBAR PUNCTURE UNDER FLUOROSCOPY PROCEDURE: An appropriate skin entry site was determined fluoroscopically. Operator donned sterile gloves and mask. Skin site was marked, then prepped with Betadine, draped in usual sterile fashion, and infiltrated locally with 1% lidocaine. A 22 gauge spinal needle advanced into the thecal sac at L4-L5 from a right interlaminar approach. Clear colorless CSF spontaneously returned. 12 ml CSF were collected and divided among 4 sterile vials for the requested laboratory studies. No opening or closing pressure was obtained. The needle was then removed. The patient tolerated the procedure well and there were no complications. FLUOROSCOPY: Radiation Exposure Index (as provided by the fluoroscopic device): 32.70 mGy Kerma IMPRESSION: Technically successful lumbar puncture under fluoroscopy. This exam was performed by Narda Rutherford, NP, and was supervised and interpreted by Kathreen Devoid, MD. Electronically Signed   By: Kathreen Devoid M.D.   On: 10/15/2021 16:57   CT  Head Wo Contrast  Result Date: 10/15/2021 CLINICAL DATA:  Mental status changes, confusion, shaking, difficulty communicating. History of chronic right MCA territory infarct EXAM: CT HEAD WITHOUT CONTRAST TECHNIQUE: Contiguous axial images were obtained from the base of the skull through the vertex without intravenous contrast. RADIATION DOSE REDUCTION: This exam was performed according to the departmental dose-optimization program which includes automated exposure control, adjustment of the mA and/or kV according to patient size and/or use of iterative reconstruction technique. COMPARISON:  10/14/2021 MR, 10/13/2021 CT FINDINGS: Brain: Remote posterior right MCA territory infarct with encephalomalacia in the right parietal and frontal lobes. Similar ex vacuo dilatation of the right lateral ventricle posteriorly. No acute intracranial hemorrhage, new mass lesion, new infarction, midline shift, herniation, hydrocephalus, or extra-axial fluid collection. No focal mass effect or edema. Cisterns are patent. No cerebellar abnormality. Vascular: No hyperdense vessel or unexpected calcification. Skull: Normal. Negative for fracture or focal lesion. Sinuses/Orbits: No acute finding. Other: None. 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 No acute intracranial abnormality or interval change by noncontrast CT. Electronically Signed   By: Jerilynn Mages.  Shick M.D.   On: 10/15/2021 10:25   DG Chest Port 1 View  Result Date: 10/15/2021 CLINICAL DATA:  Questionable sepsis - evaluate for abnormality EXAM: PORTABLE CHEST - 1 VIEW COMPARISON:  None available FINDINGS: The heart is mildly enlarged. Evaluation the mediastinum is limited due to patient obliquity. Mild patchy opacities present the right lung base likely due to atelectasis. Lungs otherwise clear. IMPRESSION: 1. Exam limited due to patient rotation. Consider repeating two view chest radiograph. 2. Mild cardiomegaly. 3. Mild right basilar atelectasis.  Electronically Signed   By: Miachel Roux M.D.   On: 10/15/2021 10:02   ECHOCARDIOGRAM COMPLETE  Result Date: 10/14/2021    ECHOCARDIOGRAM REPORT   Patient Name:   Dustin Wells Date of Exam: 10/14/2021 Medical Rec #:  [id number]  Height:       72.0 in Accession #:    [id number] Weight:       265.0 lb Date of Birth:  09/14/1965  BSA:          2.401 m Patient Age:    74 years   BP:           131/92 mmHg Patient Gender: M          HR:           89 bpm. Exam Location:  [facility] Procedure: 2D Echo, Cardiac Doppler and Color Doppler Indications:     Syncope R55  History:  Patient has no prior history of Echocardiogram examinations.                  CHF, Arrythmias:Atrial Fibrillation; Risk Factors:Hypertension.  Sonographer:     Sherrie Sport Referring Phys:  [id number] AMY N COX Diagnosing Phys: Ida Rogue MD  Sonographer Comments: Technically difficult study due to poor echo windows, suboptimal parasternal window and suboptimal apical window. IMPRESSIONS  1. Left ventricular ejection fraction, by estimation, is 55 to 60%. The left ventricle has normal function. The left ventricle has no regional wall motion abnormalities. There is mild left ventricular hypertrophy. Left ventricular diastolic parameters were normal.  2. Right ventricular systolic function is normal. The right ventricular size is normal.  3. Left atrial size was mildly dilated.  4. The mitral valve was not well visualized. No evidence of mitral valve regurgitation. No evidence of mitral stenosis.  5. The aortic valve was not well visualized. Aortic valve regurgitation is not visualized. No aortic stenosis is present.  6. The inferior vena cava is normal in size with greater than 50% respiratory variability, suggesting right atrial pressure of 3 mmHg. FINDINGS  Left Ventricle: Left ventricular ejection fraction, by estimation, is 55 to 60%. The left ventricle has normal function. The left ventricle has no regional wall motion abnormalities. The left  ventricular internal cavity size was normal in size. There is  mild left ventricular hypertrophy. Left ventricular diastolic parameters were normal. Right Ventricle: The right ventricular size is normal. No increase in right ventricular wall thickness. Right ventricular systolic function is normal. Left Atrium: Left atrial size was mildly dilated. Right Atrium: Right atrial size was normal in size. Pericardium: There is no evidence of pericardial effusion. Mitral Valve: The mitral valve was not well visualized. No evidence of mitral valve regurgitation. No evidence of mitral valve stenosis. Tricuspid Valve: The tricuspid valve is not well visualized. Tricuspid valve regurgitation is trivial. No evidence of tricuspid stenosis. Aortic Valve: The aortic valve was not well visualized. Aortic valve regurgitation is not visualized. No aortic stenosis is present. Aortic valve mean gradient measures 6.0 mmHg. Aortic valve peak gradient measures 9.5 mmHg. Aortic valve area, by VTI measures 2.23 cm. Pulmonic Valve: The pulmonic valve was not well visualized. Pulmonic valve regurgitation is not visualized. No evidence of pulmonic stenosis. Aorta: The aortic root is normal in size and structure. Venous: The inferior vena cava is normal in size with greater than 50% respiratory variability, suggesting right atrial pressure of 3 mmHg. IAS/Shunts: No atrial level shunt detected by color flow Doppler.  LEFT VENTRICLE PLAX 2D LVIDd:         4.50 cm   Diastology LVIDs:         3.30 cm   LV e' medial:   6.64 cm/s LV PW:         1.70 cm   LV E/e' medial: 10.8 LV IVS:        1.50 cm LVOT diam:     2.00 cm LV SV:         51 LV SV Index:   21 LVOT Area:     3.14 cm  RIGHT VENTRICLE RV Basal diam:  4.30 cm RV S prime:     14.60 cm/s TAPSE (M-mode): 2.0 cm LEFT ATRIUM             Index        RIGHT ATRIUM           Index LA diam:  4.00 cm 1.67 cm/m   RA Area:     16.90 cm LA Vol (A2C):   81.4 ml 33.90 ml/m  RA Volume:   43.80 ml   18.24 ml/m LA Vol (A4C):   56.8 ml 23.65 ml/m LA Biplane Vol: 71.8 ml 29.90 ml/m  AORTIC VALVE AV Area (Vmax):    2.20 cm AV Area (Vmean):   2.19 cm AV Area (VTI):     2.23 cm AV Vmax:           154.00 cm/s AV Vmean:          111.000 cm/s AV VTI:            0.227 m AV Peak Grad:      9.5 mmHg AV Mean Grad:      6.0 mmHg LVOT Vmax:         108.00 cm/s LVOT Vmean:        77.500 cm/s LVOT VTI:          0.161 m LVOT/AV VTI ratio: 0.71  AORTA Ao Root diam: 3.05 cm MITRAL VALVE               TRICUSPID VALVE MV Area (PHT): 4.15 cm    TR Peak grad:   7.0 mmHg MV Decel Time: 183 msec    TR Vmax:        132.00 cm/s MV E velocity: 72.00 cm/s MV A velocity: 50.60 cm/s  SHUNTS MV E/A ratio:  1.42        Systemic VTI:  0.16 m                            Systemic Diam: 2.00 cm Ida Rogue MD Electronically signed by Ida Rogue MD Signature Date/Time: 10/14/2021/1:53:41 PM    Final    MR BRAIN WO CONTRAST  Result Date: 10/14/2021 CLINICAL DATA:  56 year old male with altered mental status. Renal insufficiency. Administered MRI contrast yesterday for chest MRA. EXAM: MRI HEAD WITHOUT CONTRAST TECHNIQUE: Multiplanar, multiecho pulse sequences of the brain and surrounding structures were obtained without intravenous contrast. COMPARISON:  Head and cervical spine CT yesterday. Brain MRI 01/31/2020. FINDINGS: Brain: Chronic encephalomalacia in the right hemisphere, posterior right MCA territory. Chronic ex vacuo enlargement of the atrium of the right lateral ventricle. FLAIR hyperintensity in the cystic areas of encephalomalacia and adjacent sulci probably is artifact related to the recent gadolinium administration. And there is similar mild increased T1 signal there also. But stable appearance of mild underlying hemosiderin on SWI. No restricted diffusion to suggest acute infarction. No midline shift, mass effect, evidence of mass lesion, ventriculomegaly, extra-axial collection or acute intracranial hemorrhage.  Cervicomedullary junction and pituitary are within normal limits. Stable gray and white matter signal elsewhere since 2021, scattered nonspecific bilateral white matter T2 and FLAIR hyperintense foci. No other cortical encephalomalacia or chronic cerebral blood products identified. Deep gray matter nuclei, brainstem and cerebellum remain within normal limits. Vascular: Major intracranial vascular flow voids are stable since 2021, dominant appearing distal right vertebral artery. Skull and upper cervical spine: Negative visible cervical spine. Visualized bone marrow signal is within normal limits. Sinuses/Orbits: Stable and negative orbits. Minimal paranasal sinus mucosal thickening or retention cysts have regressed since 2021. Other: Trace mastoid fluid has not significantly changed. Grossly normal visible internal auditory structures otherwise. Negative visible scalp and face. IMPRESSION: 1. No acute intracranial abnormality. 2. Chronic Right MCA territory encephalomalacia. Other mild for age nonspecific white matter  changes. Electronically Signed   By: Genevie Ann M.D.   On: 10/14/2021 10:52   US RENAL  Result Date: 10/14/2021 CLINICAL DATA:  Acute kidney injury. EXAM: RENAL / URINARY TRACT ULTRASOUND COMPLETE COMPARISON:  Abdominal ultrasound 07/07/2021 FINDINGS: Right Kidney: Renal measurements: 11.2 x 5.8 x 5.4 cm = volume: 185 mL. Echogenicity within normal limits. Right renal midpole 1.2 x 0.8 x 0.5 cm simple cyst appears not significantly changed from prior when measured in a similar manner. Additional mid to lower pole 1.1 x 0.8 x 0.6 cm simple cyst. Left Kidney: Renal measurements: 11.6 x 5.5 x 5.7 cm = volume: 189 mL. Echogenicity within normal limits. Left midpole 1.1 x 1.1 x 0.7 cm benign simple cyst. Bladder: Appears normal for degree of bladder distention. Other: None. IMPRESSION: 1. No hydronephrosis within either kidney. 2. Benign bilateral renal simple cysts measuring up to 1.2 cm on the right.  Electronically Signed   By: Yvonne Kendall M.D.   On: 10/14/2021 10:02   MR ANGIO CHEST W WO CONTRAST  Result Date: 10/13/2021 CLINICAL DATA:  Syncope, chest pain.  Evaluate for dissection EXAM: MRA CHEST WITH OR WITHOUT CONTRAST TECHNIQUE: Angiographic images of the chest were obtained using MRA technique without and with intravenous contrast. CONTRAST:  29m GADAVIST GADOBUTROL 1 MMOL/ML IV SOLN COMPARISON:  None Available. FINDINGS: VASCULAR Aorta: Conventional 3 vessel arch anatomy. No evidence dissection aneurysm. Heart: Normal in size.  No pericardial effusion. Pulmonary Arteries: Normal in size. No signal abnormality to suggest pulmonary embolism. Other: None. NON-VASCULAR No focal signal abnormality or abnormal enhancement within the lungs, mediastinum, visualized upper abdomen or musculoskeletal soft tissues. IMPRESSION: 1. No evidence of aortic dissection, aneurysm or other acute arterial abnormality. 2. No acute cardiopulmonary process. Electronically Signed   By: HJacqulynn CadetM.D.   On: 10/13/2021 12:57   CT HEAD WO CONTRAST (5MM)  Result Date: 10/13/2021 CLINICAL DATA:  Woke up with weakness and dizziness and fell. EXAM: CT HEAD WITHOUT CONTRAST TECHNIQUE: Contiguous axial images were obtained from the base of the skull through the vertex without intravenous contrast. RADIATION DOSE REDUCTION: This exam was performed according to the departmental dose-optimization program which includes automated exposure control, adjustment of the mA and/or kV according to patient size and/or use of iterative reconstruction technique. 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 No acute intracranial findings such as hemispheric infarction or intracranial hemorrhage. Stable patchy periventricular white matter disease. No extra-axial fluid  collections are identified. No mass lesions. The brainstem and cerebellum are grossly normal. Vascular: Scattered vascular calcifications. No aneurysm or hyperdense vessels. Skull: No skull fracture or bone lesions. Sinuses/Orbits: The paranasal sinuses and mastoid air cells are clear. The globes are intact. Other: No scalp lesions or scalp hematoma. IMPRESSION: 1. 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 2. No acute intracranial findings or mass lesions. Electronically Signed   By: PMarijo SanesM.D.   On: 10/13/2021 11:13   CT Cervical Spine Wo Contrast  Result Date: 10/13/2021 CLINICAL DATA:  Neck trauma. Dangers injury mechanism. Weakness and dizziness. Trauma to the back of the head. EXAM: CT CERVICAL SPINE WITHOUT CONTRAST TECHNIQUE: Multidetector CT imaging of the cervical spine was performed without intravenous contrast. Multiplanar CT image reconstructions were also generated. RADIATION DOSE REDUCTION: This exam was performed according to the departmental dose-optimization program which includes automated  exposure control, adjustment of the mA and/or kV according to patient size and/or use of iterative reconstruction technique. COMPARISON:  None FINDINGS: Alignment: Normal Skull base and vertebrae: No fracture or focal bone lesion. Soft tissues and spinal canal: No traumatic soft tissue finding. Disc levels: Ordinary spondylosis at C6-7 with small endplate osteophytes but no bony stenosis of the canal or foramina. Facet osteoarthritis on the left at C7-T1. Upper chest: Negative Other: None IMPRESSION: No acute or traumatic finding.  Mild cervical degenerative changes. Electronically Signed   By: Nelson Chimes M.D.   On: 10/13/2021 11:03   DG Chest Portable 1 View  Result Date: 10/13/2021 CLINICAL DATA:  Syncopal episode. EXAM: PORTABLE CHEST 1 VIEW COMPARISON:  Radiographs 08/26/2017. FINDINGS: 1038 hours. The heart size appears stable at the upper limits of normal for portable AP  technique. Probable mild atelectasis or scarring at both lung bases. No confluent airspace opacity, edema, pneumothorax or significant pleural effusion. No acute osseous findings are evident. Telemetry leads overlie the chest. IMPRESSION: No evidence of active cardiopulmonary process. Electronically Signed   By: Richardean Sale M.D.   On: 10/13/2021 10:47    Microbiology: Results for orders placed or performed during the hospital encounter of 10/15/21  Resp Panel by RT-PCR (Flu A&B, Covid) Anterior Nasal Swab     Status: None   Collection Time: 10/15/21 10:23 AM   Specimen: Anterior Nasal Swab  Result Value Ref Range Status   SARS Coronavirus 2 by RT PCR NEGATIVE NEGATIVE Final    Comment: (NOTE) SARS-CoV-2 target nucleic acids are NOT DETECTED.  The SARS-CoV-2 RNA is generally detectable in upper respiratory specimens during the acute phase of infection. The lowest concentration of SARS-CoV-2 viral copies this assay can detect is 138 copies/mL. A negative result does not preclude SARS-Cov-2 infection and should not be used as the sole basis for treatment or other patient management decisions. A negative result may occur with  improper specimen collection/handling, submission of specimen other than nasopharyngeal swab, presence of viral mutation(s) within the areas targeted by this assay, and inadequate number of viral copies(<138 copies/mL). A negative result must be combined with clinical observations, patient history, and epidemiological information. The expected result is Negative.  Fact Sheet for Patients:  EntrepreneurPulse.com.au  Fact Sheet for Healthcare Providers:  IncredibleEmployment.be  This test is no t yet approved or cleared by the [place] FDA and  has been authorized for detection and/or diagnosis of SARS-CoV-2 by FDA under an Emergency Use Authorization (EUA). This EUA will remain  in effect (meaning this test can be used)  for the duration of the COVID-19 declaration under Section 564(b)(1) of the Act, 21 U.S.C.section 360bbb-3(b)(1), unless the authorization is terminated  or revoked sooner.       Influenza A by PCR NEGATIVE NEGATIVE Final   Influenza B by PCR NEGATIVE NEGATIVE Final    Comment: (NOTE) The Xpert Xpress SARS-CoV-2/FLU/RSV plus assay is intended as an aid in the diagnosis of influenza from Nasopharyngeal swab specimens and should not be used as a sole basis for treatment. Nasal washings and aspirates are unacceptable for Xpert Xpress SARS-CoV-2/FLU/RSV testing.  Fact Sheet for Patients: EntrepreneurPulse.com.au  Fact Sheet for Healthcare Providers: IncredibleEmployment.be  This test is not yet approved or cleared by the [place] FDA and has been authorized for detection and/or diagnosis of SARS-CoV-2 by FDA under an Emergency Use Authorization (EUA). This EUA will remain in effect (meaning this test can be used) for the duration of the COVID-19 declaration  under Section 564(b)(1) of the Act, 21 U.S.C. section 360bbb-3(b)(1), unless the authorization is terminated or revoked.  Performed at [facility], [address] [place], [place] [address]   Blood Culture (routine x 2)     Status: None (Preliminary result)   Collection Time: 10/15/21 10:23 AM   Specimen: BLOOD  Result Value Ref Range Status   Specimen Description BLOOD LEFT ANTECUBITAL  Final   Special Requests   Final    BOTTLES DRAWN AEROBIC AND ANAEROBIC Blood Culture adequate volume   Culture   Final    NO GROWTH 4 DAYS Performed at [facility], [address] [place], [place] [address]    Report Status PENDING  Incomplete  Urine Culture     Status: None   Collection Time: 10/15/21 12:21 PM   Specimen: In/Out Cath Urine  Result Value Ref Range Status   Specimen Description   Final    IN/OUT CATH URINE Performed at [facility], [address] [place], [place] [address]    Special Requests   Final    NONE Performed at [facility], [address] [place], [place] [address]    Culture   Final    NO GROWTH Performed at [facility], [place] [address], [place], [place] [address]    Report Status 10/16/2021 FINAL  Final  CSF culture w Gram Stain     Status: None   Collection Time: 10/15/21  4:57 PM   Specimen: CSF; Cerebrospinal Fluid  Result Value Ref Range Status   Specimen Description   Final    CSF Performed at [facility], [address] [place], [place] [address]    Special Requests   Final    NONE Performed at [facility], [address] [place], [place] [address]    Gram Stain   Final    WBC SEEN RED BLOOD CELLS PRESENT NO ORGANISMS SEEN Performed at [facility], [address], [place], [place] [address]    Culture   Final    NO GROWTH 3 DAYS Performed at [facility], [place] [address], [place], [place] [address]    Report Status 10/19/2021 FINAL  Final  Culture, fungus without smear     Status: None (Preliminary result)   Collection Time: 10/15/21  4:58 PM   Specimen: CSF; Cerebrospinal Fluid  Result Value Ref Range Status   Specimen Description   Final    CSF Performed at [facility], [address], [place], [place] [address]    Special Requests   Final    NONE Performed at [facility] [address] [place], [place] [address]    Culture   Final    NO FUNGUS ISOLATED AFTER 3 DAYS Performed at [facility], [place] [address] [place],  [address]    Report Status PENDING  Incomplete  Culture, blood (Routine X 2) w Reflex to ID Panel     Status: None (Preliminary result)   Collection Time: 10/17/21  6:01 PM   Specimen: BLOOD LEFT ARM  Result Value Ref Range Status   Specimen Description BLOOD LEFT ARM  Final   Special Requests   Final    BOTTLES DRAWN AEROBIC AND ANAEROBIC BACTEROIDES CACCAE   Culture    Final    NO GROWTH 2 DAYS Performed at [facility], [address], [place],  [address]    Report Status PENDING  Incomplete  Culture,  blood (Routine X 2) w Reflex to ID Panel     Status: None (Preliminary result)   Collection Time: 10/19/21  5:11 AM   Specimen: BLOOD  Result Value Ref Range Status   Specimen Description BLOOD RIGHT [facility]  Final   Special Requests   Final    BOTTLES DRAWN AEROBIC AND ANAEROBIC Blood Culture adequate volume   Culture   Final    NO GROWTH < 12 HOURS Performed at [facility], [address] [place], [place] [address]    Report Status PENDING  Incomplete  Culture, blood (Routine X 2) w Reflex to ID Panel     Status: None (Preliminary result)   Collection Time: 10/19/21  5:11 AM   Specimen: BLOOD  Result Value Ref Range Status   Specimen Description BLOOD RIGHT HAND  Final   Special Requests   Final    BOTTLES DRAWN AEROBIC AND ANAEROBIC Blood Culture results may not be optimal due to an inadequate volume of blood received in culture bottles   Culture   Final    NO GROWTH < 12 HOURS Performed at [facility], [address] [place], [place] [address]    Report Status PENDING  Incomplete    Labs: CBC: Recent Labs  Lab 10/13/21 1026 10/14/21 0652 10/15/21 0949 10/15/21 1023 10/16/21 0723 10/19/21 1634  WBC 5.9 4.4 8.7 8.6 10.9* 6.9  NEUTROABS 4.4  --  6.2 7.0  --  4.3  HGB 14.5 12.7* 13.5 13.1 12.8* 13.0  HCT 43.7 37.4* 40.2 37.8* 36.9* 39.5  MCV 93.8 92.1 91.4 90.2 91.3 92.7  PLT 101* 91* 128* 107* 129* 903   Basic Metabolic Panel: Recent Labs  Lab 10/13/21 1026 10/14/21 0652 10/15/21 0949 10/15/21 1023 10/16/21 0723 10/17/21 1020 10/19/21 0511  NA 136   < > 136 136 137 134* 138  K 3.8   < > 3.8 3.9 3.7 3.4* 4.0  CL 99   < > 102 105 102 97* 105  CO2 24   < > 23 22 21* 25 26  GLUCOSE 138*   < > 152* 134* 181* 195* 92  BUN 47*   < > 29* 24* 29* 41* 33*  CREATININE 4.17*   < > 2.67* 2.05*  2.09* 1.95* 1.68*  CALCIUM 9.2   < > 9.4 8.8* 8.5* 8.8* 9.2  MG 1.9  --   --   --   --   --  1.9   < > = values in this interval not displayed.   Liver Function Tests: Recent Labs  Lab 10/13/21 1026 10/15/21 0949 10/15/21 1023  AST 62* 64* 60*  ALT 41 36 35  ALKPHOS 78 66 68  BILITOT 1.2 1.7* 1.6*  PROT 7.8 7.6 7.2  ALBUMIN 3.7 3.6 3.4*   CBG: Recent Labs  Lab 10/18/21 1628 10/18/21 1956 10/19/21 0735 10/19/21 1132 10/19/21 1656  GLUCAP 94 96 100* 120* 108*    Discharge time spent: greater than 30 minutes.  This record has been created using Systems analyst. Errors have been sought and corrected,but may not always be located. Such creation errors do not reflect on the standard of care.   Signed: Lorella Nimrod, MD [facility] 10/19/2021

## 2021-10-19 NOTE — Progress Notes (Signed)
Mobility Specialist - Progress Note      10/19/21 1003  Mobility  Activity Ambulated with assistance in hallway  Level of Assistance Standby assist, set-up cues, supervision of patient - no hands on  Assistive Device Front wheel walker  Distance Ambulated (ft) 80 ft  Activity Response Tolerated well  $Mobility charge 1 Mobility   Pt at EOB upon entry. Pt utilizing RA. Pt stood up with min assist. Pt ambulated down the hallway with SBA. Pt left supine with needs in reach. No complaints.   Candie Mile Mobility Specialist 10/19/21 10:08 AM

## 2021-10-19 NOTE — Consult Note (Signed)
Neurology Consultation Reason for Consult: Altered mental status and CSF pleocytosis Requesting Physician: Tsosie Billing abd Lorella Nimrod  CC: Altered mental status  History is obtained from: Patient, wife, daughter, and chart review  HPI: Marquis Down is a 56 y.o. 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  He was started on Ozempic a few months ago.  In this setting he has lost approximately 20 pounds.  In the past few weeks he has been sleeping more, 8 hours at night as well as new afternoon naps for 2 to 4 hours.  Around 7/18 he noticed a little longer had an urge to drink (has been unsuccessfully trying to quit for many years).  On 7/21 he had severe back pain in the bilateral kidney area with elevated heart rate while he was at work which was ongoing with increase of his heart rate to the 90s-100s, which is normally low due to his medications, though he denies any fevers, nausea, vomiting.  He did have reduced appetite.  He managed to make it through his work shift on Saturday per review of his time sheet, however he is unable to recall any details of Saturday at all.  He notes he was off on Sunday and mostly rested on the couch or in his bed.  Over the weekend he also did have some episodes of urinary urgency not making it to the bathroom on time and peeing on the floor.  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  He attempted to  make it through his shift as a policy states he can be terminated for more than 6 absences in some time.  And his 6 absence was already counted for last Friday.  However he talk to his supervisor and they agreed that he needed to leave.  He did see his PCP on Tuesday, had FMLA paperwork completed and they actually considered increasing his Ozempic dose, no notes in the chart of him feeling unwell at the time although the patient notes he had continued to feel poorly and had back pain.  He had tried to work on Wednesday but again due to feeling unwell presented to the ED where he was found to be hypotensive and again complaining of some back pain and neck pain.  He was treated with IV fluids and discharged although he was noted to be febrile just before discharge.  His wife noted he was quite sleepy on Thursday after discharge but she attributed this to being tired from ED evaluation.  Friday morning he was noted to have peed more on the floor on the way to the bathroom and after his wife cleaned up she noted he was sitting on the side of the bed, with more urine in the bed, grunting and moaning and not very responsive to her.  She thought she he had may be taking too much gabapentin or something else to help him sleep, and went back to check on him a few hours later where she noted he was trying to take a  shower but had had further urinary incontinence in his stream on the way to the bathroom.  He was looking at her but not very responsive and he subsequently had bowel incontinence as well.  She brought her back to the ED for evaluation.    He was given a dose of ceftriaxone at 10:20 AM, and then lumbar puncture was completed.  Subsequent to significant pleocytosis on the lumbar puncture antibiotics were broadened to meningitis coverage including acyclovir.  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  This is been felt to be of uncertain  clinical significance in a immunocompetent patient (notably literature review notes up to 1% of the population has HHV-6 integration into human chromosomes so false positives are fairly common in the CSF).  Due to the HHV-6 result he was broadened to acyclovir.  He was additionally treated with a single dose of dexamethasone 10 mg which has not been repeated, and received thiamine supplementation  His mentation has markedly improved during his time in the hospital, and he is essentially back to baseline although he notes he is still having occasional spasms of his left hand that are uncontrollable but brief and to happening currently 3-4 times a day without clear trigger and not awakening him from sleep.  Family also notes that he came in with fairly marked left hand weakness on Friday but this has been gradually improving.  He has had a recent colonoscopy that included a polyp removal which was found to be benign.  Notably at baseline he does not drive secondary to having lost his license due to DUI, but his wife takes him to work  ROS: All other review of systems was negative except as noted in the HPI.   Past Medical History:  Diagnosis Date   Atrial fibrillation (HCC)    CHF (congestive heart failure) (HCC)    Hypertension    Renal disorder    Stroke [facility])    Testicular cancer [facility])    Past Surgical History:  Procedure Laterality Date   cardiac albation     CARDIAC ELECTROPHYSIOLOGY MAPPING AND ABLATION     Current Outpatient Medications  Medication Instructions   acetaminophen (TYLENOL) 650 mg, Oral, Every 6 hours PRN   allopurinol (ZYLOPRIM) 300 mg, Oral, Daily   amLODipine (NORVASC) 5 mg, Oral, Daily   apixaban (ELIQUIS) 5 mg, Oral, 2 times daily   atorvastatin (LIPITOR) 40 mg, Oral, Daily   Cholecalciferol 1,000 Units, Oral, Daily   clomiPHENE (CLOMID) 50 MG tablet Take 1/2 tablet daily   cyanocobalamin 1,000 mcg, Oral, Daily   diclofenac Sodium (VOLTAREN) 1 % GEL Apply  topically.   flecainide (TAMBOCOR) 100 mg, Oral, Every 12 hours   folic acid (FOLVITE) 1 MG tablet Oral   gabapentin (NEURONTIN) 300 MG capsule Take 1-3 tabs at night   LORazepam (ATIVAN) 2 mg, Oral, At bedtime PRN   losartan (COZAAR) 100 mg, Oral, Daily, Hold until you see your nephrologist   magnesium oxide (MAG-OX) 400 (240 Mg) MG tablet 1 tablet, Oral, 2 times daily   metoprolol succinate (TOPROL-XL) 100 mg, Oral, Daily   Ozempic (2 MG/DOSE) 2 mg, Subcutaneous, Weekly, Monday    pantoprazole (PROTONIX) 40 mg, Oral, Daily   sildenafil (REVATIO) 20 MG tablet Take 3-5 tablets 1 hr prior to intercourse as needed   sodium bicarbonate 650 mg, Oral, 2 times daily   torsemide (DEMADEX) 10 mg, Oral, Daily, Hold until you  see your nephrologist    Current Facility-Administered Medications:    acetaminophen (TYLENOL) tablet 650 mg, 650 mg, Oral, Q6H PRN, Ivor Costa, MD, 650 mg at 10/19/21 1210   albuterol (PROVENTIL) (2.5 MG/3ML) 0.083% nebulizer solution 2.5 mg, 2.5 mg, Inhalation, Q4H PRN, Ivor Costa, MD   allopurinol (ZYLOPRIM) tablet 300 mg, 300 mg, Oral, Daily, Ivor Costa, MD, 300 mg at 10/19/21 6761   apixaban (ELIQUIS) tablet 5 mg, 5 mg, Oral, BID, Ivor Costa, MD, 5 mg at 10/19/21 0910   atorvastatin (LIPITOR) tablet 40 mg, 40 mg, Oral, Daily, Ivor Costa, MD, 40 mg at 10/18/21 1801   cholecalciferol (VITAMIN D3) 25 MCG (1000 UNIT) tablet 1,000 Units, 1,000 Units, Oral, Daily, Ivor Costa, MD, 1,000 Units at 10/19/21 0910   cyanocobalamin (VITAMIN B12) tablet 1,000 mcg, 1,000 mcg, Oral, Daily, Ivor Costa, MD, 1,000 mcg at 10/19/21 9509   dextromethorphan-guaiFENesin ([place] DM) 30-600 MG per 12 hr tablet 1 tablet, 1 tablet, Oral, BID PRN, Ivor Costa, MD   flecainide [facility]) tablet 100 mg, 100 mg, Oral, Q12H, Ivor Costa, MD, 100 mg at 32/67/12 4580   folic acid (FOLVITE) tablet 1 mg, 1 mg, Oral, Daily, Ivor Costa, MD, 1 mg at 10/19/21 9983   ganciclovir (CYTOVENE) 300 mg in sodium chloride  0.9 % 100 mL IVPB, 2.5 mg/kg, Intravenous, Q12H, Tsosie Billing, MD, Last Rate: 100 mL/hr at 10/19/21 1056, 300 mg at 10/19/21 1056   hydrALAZINE (APRESOLINE) injection 5 mg, 5 mg, Intravenous, Q2H PRN, Ivor Costa, MD   insulin aspart (novoLOG) injection 0-5 Units, 0-5 Units, Subcutaneous, QHS, Ivor Costa, MD   insulin aspart (novoLOG) injection 0-9 Units, 0-9 Units, Subcutaneous, TID WC, Ivor Costa, MD, 2 Units at 10/17/21 1727   [EXPIRED] LORazepam (ATIVAN) injection 0-4 mg, 0-4 mg, Intravenous, Q6H **FOLLOWED BY** LORazepam (ATIVAN) injection 0-4 mg, 0-4 mg, Intravenous, Q12H, Ivor Costa, MD   LORazepam (ATIVAN) tablet 2 mg, 2 mg, Oral, QHS PRN, Ivor Costa, MD   meropenem (MERREM) 2 g in sodium chloride 0.9 % 100 mL IVPB, 2 g, Intravenous, Q8H, Ravishankar, Jayashree, MD, Last Rate: 280 mL/hr at 10/19/21 0917, 2 g at 10/19/21 3825   multivitamin with minerals tablet 1 tablet, 1 tablet, Oral, Daily, Ivor Costa, MD, 1 tablet at 10/19/21 0909   ondansetron (ZOFRAN) injection 4 mg, 4 mg, Intravenous, Q8H PRN, Ivor Costa, MD   pantoprazole (PROTONIX) EC tablet 40 mg, 40 mg, Oral, Daily, Ivor Costa, MD, 40 mg at 10/19/21 0910   sodium bicarbonate tablet 650 mg, 650 mg, Oral, BID, Ivor Costa, MD, 650 mg at 10/19/21 0539   thiamine (VITAMIN B1) tablet 100 mg, 100 mg, Oral, Daily, 100 mg at 10/19/21 0909 **OR** thiamine (VITAMIN B1) injection 100 mg, 100 mg, Intravenous, Daily, Ivor Costa, MD   Family History  Problem Relation Age of Onset   Hypertension Mother    Heart disease Father     Social History:  reports that he has never smoked. He has never used smokeless tobacco. He reports current alcohol use of about 21.0 standard drinks of alcohol per week. He reports that he does not currently use drugs.   Exam: Current vital signs: BP (!) 108/56 (BP Location: Right Arm)   Pulse 92   Temp 97.7 F (36.5 C)   Resp 18   Wt 115.6 kg   SpO2 99%   BMI 34.56 kg/m  Vital signs in last 24  hours: Temp:  [97.4 F (36.3 C)-98.1 F (36.7 C)] 97.7 F (36.5 C) (08/01  1131) Pulse Rate:  [67-92] 92 (08/01 1131) Resp:  [17-18] 18 (08/01 1131) BP: (108-138)/(56-84) 108/56 (08/01 1131) SpO2:  [92 %-99 %] 99 % (08/01 1131) Weight:  [115.6 kg] 115.6 kg (08/01 0500)   Physical Exam  Constitutional: Appears well-developed and well-nourished.  Obese Psych: Affect appropriate to situation, calm and cooperative Eyes: No scleral injection HENT: No oropharyngeal obstruction.  MSK: no joint deformities.  Cardiovascular: Perfusing extremities well Respiratory: Effort normal, non-labored breathing GI: Soft.  No distension. There is no tenderness.  Skin: Warm dry and intact visible skin  Neuro: Mental Status: Patient is awake, alert, oriented to person, place, month, year, and situation. Patient is able to give a clear and coherent history. Very mild left-sided neglect Cranial Nerves: II: Visual Fields are full. Pupils are equal, round, and reactive to light.   III,IV, VI: EOMI without ptosis or diploplia.  V: Facial sensation is symmetric to temperature VII: Facial movement is symmetric.  VIII: hearing is intact to voice X: Uvula elevates symmetrically XI: Shoulder shrug is symmetric. XII: tongue is midline without atrophy or fasciculations.  Motor: Tone is normal. Bulk is normal. 5/5 strength was present in all four extremities, other than bilateral hip flexion weakness 4/5. He does have a subtle left upper extremity pronator drift which appears only on distraction Sensory: Sensation is symmetric to light touch and temperature in the arms and legs. Deep Tendon Reflexes: 2+ in the brachioradialis and patellae, but notably brisker on the left than the right.  Cerebellar: FNF and HKS are intact bilaterally Gait:  Deferred   I have reviewed labs in epic and the results pertinent to this consultation are:  Basic Metabolic Panel: Recent Labs  Lab 10/13/21 1026 10/14/21 0652  10/15/21 0949 10/15/21 1023 10/16/21 0723 10/17/21 1020 10/19/21 0511  NA 136   < > 136 136 137 134* 138  K 3.8   < > 3.8 3.9 3.7 3.4* 4.0  CL 99   < > 102 105 102 97* 105  CO2 24   < > 23 22 21* 25 26  GLUCOSE 138*   < > 152* 134* 181* 195* 92  BUN 47*   < > 29* 24* 29* 41* 33*  CREATININE 4.17*   < > 2.67* 2.05* 2.09* 1.95* 1.68*  CALCIUM 9.2   < > 9.4 8.8* 8.5* 8.8* 9.2  MG 1.9  --   --   --   --   --  1.9   < > = values in this interval not displayed.    CBC: Recent Labs  Lab 10/13/21 1026 10/14/21 0652 10/15/21 0949 10/15/21 1023 10/16/21 0723  WBC 5.9 4.4 8.7 8.6 10.9*  NEUTROABS 4.4  --  6.2 7.0  --   HGB 14.5 12.7* 13.5 13.1 12.8*  HCT 43.7 37.4* 40.2 37.8* 36.9*  MCV 93.8 92.1 91.4 90.2 91.3  PLT 101* 91* 128* 107* 129*    Coagulation Studies: No results for input(s): "LABPROT", "INR" in the last 72 hours.    Serum gluc 7/28 at 16:59 117 Ceftriaxone 7/28 at 10:20, Ampicillin 17:38, Vanc 17:55 Gancyclovir 7/29 at 16:17, Ceftaroline at 20:41    Ref range 10/15/21 16:56 10/15/21 16:57  Appearance, CSF CLEAR  HAZY !   Glucose, CSF 40 - 70 mg/dL  54 (46% of serum value of 117)  RBC Count, CSF 0 - 3 /cu mm 12 (H)   WBC, CSF 0 - 5 /cu mm 804 (HH)   Segmented Neutrophils-CSF % 75   Lymphs,  CSF % 19   Monocyte-Macrophage-Spinal Fluid % 6   Eosinophils, CSF % 0   Color, CSF COLORLESS  COLORLESS   Supernatant  CLEAR   Total  Protein, CSF 15 - 45 mg/dL  103 (H)  Tube #  3 (C)    No results found for: "ESRSEDRATE", "POCTSEDRATE"  No results found for: "CRP"   I have reviewed the images obtained:  MRI brain w/o 7/27 personally reviewed  1. No acute intracranial abnormality. 2. Chronic Right MCA territory encephalomalacia. Other mild for age nonspecific white matter changes.  MRI brain w/ and w/o 7/29 personally reviewed, agree with radiology:   1. No acute intracranial abnormality or significant interval change. 2. Stable encephalomalacia of the right  posterior temporal and parietal lobe. [place] Scrape is some DWI change in this area not seen on prior MRI, can be seizure related change vs. artifact] 3. Scattered subcortical T2 hyperintensities bilaterally are mildly advanced for age. The finding is nonspecific but can be seen in the setting of chronic microvascular ischemia, a demyelinating process such as multiple sclerosis, vasculitis, complicated migraine headaches, or as the sequelae of a prior infectious or inflammatory process.  Notably CT abdomen pelvis was notable for stranding around the bilateral kidneys concerning for acute versus chronic pyelonephritis    Impression: Certainly this patient has had an atypical presentation.  An extended discussion with family, patient, and infectious disease, I feel the most likely explanation is as follows: Reduced appetite and addictive behavior in the setting of starting Ozempic leading to reduced seizure threshold secondary to alcohol withdrawal.  Subsequently increased urinary frequency/urgency and back pain secondary to pyelonephritis leading to further reduction in his seizure threshold.  Subsequent focal seizures, possibility of unwitnessed generalized seizures leading to altered mental status and increased left sided weakness, which has been resolving with treatment with broad-spectrum antibiotics that are likely treating his pyelonephritis.  I do think that his continued intermittent left hand spasms may be continued focal seizures  Certainly I discussed that a pleocytosis to the 800s is very uncommon with focal status, and typically I would expect pleocytosis of a far less impressive degree.  However I feel the HHV-6 is even less likely etiology given he is immunocompetent, and he would not be expected to improve this quickly from HHV-6 encephalitis.  An uncommon presentation of a common problem is more likely than an extremely rare diagnosis  Given his history of seminoma I additionally considered  an autoimmune/paraneoplastic process, but would not expect this to rapidly improve with a single dose of dexamethasone 10 mg, and therefore will hold off on further treatment.  Regarding repeat LP, given that he is on apixaban and improving, I do not feel that benefits outweigh the risks  Recommendations: - EEG - Keppra 500 mg twice daily - Antibiotic coverage per ID for likely pyelonephritis - Repeat CBC today to confirm no worsening leukocytosis at this time, as well as ESR/CRP for inflammatory baseline, and LFTs tomorrow - Continue to observe off of antibiotics and after initiation of Keppra - Discussed with ID in person and primary team via secure chat  Lesleigh Noe MD-PhD [facility] [phone] [facility] coverage for [facility] is from 8 AM to 4 AM in-house and 4 PM to 8 PM by telephone/video. 8 PM to 8 AM emergent questions or overnight urgent questions should be addressed to Teleneurology On-call or [facility] neurohospitalist; contact information can be found on AMION

## 2021-10-20 ENCOUNTER — Inpatient Hospital Stay (HOSPITAL_COMMUNITY): Payer: 59

## 2021-10-20 ENCOUNTER — Inpatient Hospital Stay (HOSPITAL_COMMUNITY)
Admission: AD | Admit: 2021-10-20 | Discharge: 2021-10-25 | DRG: 097 | Disposition: A | Discharge status: Home / Self Care | Payer: 59 | Source: Other Acute Inpatient Hospital | Attending: Family Medicine | Admitting: Family Medicine

## 2021-10-20 DIAGNOSIS — G40101 Localization-related (focal) (partial) symptomatic epilepsy and epileptic syndromes with simple partial seizures, not intractable, with status epilepticus: Secondary | ICD-10-CM | POA: Diagnosis present

## 2021-10-20 DIAGNOSIS — R569 Unspecified convulsions: Secondary | ICD-10-CM | POA: Diagnosis not present

## 2021-10-20 DIAGNOSIS — R319 Hematuria, unspecified: Secondary | ICD-10-CM | POA: Diagnosis present

## 2021-10-20 DIAGNOSIS — D696 Thrombocytopenia, unspecified: Secondary | ICD-10-CM | POA: Diagnosis present

## 2021-10-20 DIAGNOSIS — I480 Paroxysmal atrial fibrillation: Secondary | ICD-10-CM | POA: Diagnosis present

## 2021-10-20 DIAGNOSIS — G912 (Idiopathic) normal pressure hydrocephalus: Secondary | ICD-10-CM | POA: Diagnosis present

## 2021-10-20 DIAGNOSIS — I5032 Chronic diastolic (congestive) heart failure: Secondary | ICD-10-CM | POA: Diagnosis present

## 2021-10-20 DIAGNOSIS — F419 Anxiety disorder, unspecified: Secondary | ICD-10-CM | POA: Diagnosis present

## 2021-10-20 DIAGNOSIS — N182 Chronic kidney disease, stage 2 (mild): Secondary | ICD-10-CM | POA: Diagnosis present

## 2021-10-20 DIAGNOSIS — Z7901 Long term (current) use of anticoagulants: Secondary | ICD-10-CM

## 2021-10-20 DIAGNOSIS — E669 Obesity, unspecified: Secondary | ICD-10-CM | POA: Diagnosis present

## 2021-10-20 DIAGNOSIS — G9341 Metabolic encephalopathy: Secondary | ICD-10-CM | POA: Diagnosis present

## 2021-10-20 DIAGNOSIS — R6520 Severe sepsis without septic shock: Secondary | ICD-10-CM

## 2021-10-20 DIAGNOSIS — Z888 Allergy status to other drugs, medicaments and biological substances status: Secondary | ICD-10-CM

## 2021-10-20 DIAGNOSIS — A419 Sepsis, unspecified organism: Secondary | ICD-10-CM | POA: Diagnosis present

## 2021-10-20 DIAGNOSIS — Z8249 Family history of ischemic heart disease and other diseases of the circulatory system: Secondary | ICD-10-CM

## 2021-10-20 DIAGNOSIS — C6290 Malignant neoplasm of unspecified testis, unspecified whether descended or undescended: Secondary | ICD-10-CM | POA: Diagnosis not present

## 2021-10-20 DIAGNOSIS — Z20822 Contact with and (suspected) exposure to covid-19: Secondary | ICD-10-CM | POA: Diagnosis present

## 2021-10-20 DIAGNOSIS — G9389 Other specified disorders of brain: Secondary | ICD-10-CM | POA: Diagnosis present

## 2021-10-20 DIAGNOSIS — F32A Depression, unspecified: Secondary | ICD-10-CM | POA: Diagnosis present

## 2021-10-20 DIAGNOSIS — G40001 Localization-related (focal) (partial) idiopathic epilepsy and epileptic syndromes with seizures of localized onset, not intractable, with status epilepticus: Secondary | ICD-10-CM

## 2021-10-20 DIAGNOSIS — I10 Essential (primary) hypertension: Secondary | ICD-10-CM | POA: Diagnosis not present

## 2021-10-20 DIAGNOSIS — K219 Gastro-esophageal reflux disease without esophagitis: Secondary | ICD-10-CM | POA: Diagnosis present

## 2021-10-20 DIAGNOSIS — I130 Hypertensive heart and chronic kidney disease with heart failure and stage 1 through stage 4 chronic kidney disease, or unspecified chronic kidney disease: Secondary | ICD-10-CM | POA: Diagnosis present

## 2021-10-20 DIAGNOSIS — I482 Chronic atrial fibrillation, unspecified: Secondary | ICD-10-CM | POA: Diagnosis present

## 2021-10-20 DIAGNOSIS — R161 Splenomegaly, not elsewhere classified: Secondary | ICD-10-CM | POA: Diagnosis present

## 2021-10-20 DIAGNOSIS — E785 Hyperlipidemia, unspecified: Secondary | ICD-10-CM | POA: Diagnosis present

## 2021-10-20 DIAGNOSIS — Z8547 Personal history of malignant neoplasm of testis: Secondary | ICD-10-CM

## 2021-10-20 DIAGNOSIS — R140 Abdominal distension (gaseous): Secondary | ICD-10-CM | POA: Diagnosis not present

## 2021-10-20 DIAGNOSIS — F1020 Alcohol dependence, uncomplicated: Secondary | ICD-10-CM | POA: Diagnosis present

## 2021-10-20 DIAGNOSIS — A879 Viral meningitis, unspecified: Secondary | ICD-10-CM | POA: Diagnosis present

## 2021-10-20 DIAGNOSIS — G0490 Encephalitis and encephalomyelitis, unspecified: Principal | ICD-10-CM | POA: Diagnosis present

## 2021-10-20 DIAGNOSIS — J9601 Acute respiratory failure with hypoxia: Secondary | ICD-10-CM | POA: Diagnosis present

## 2021-10-20 DIAGNOSIS — Z8673 Personal history of transient ischemic attack (TIA), and cerebral infarction without residual deficits: Secondary | ICD-10-CM

## 2021-10-20 DIAGNOSIS — G9340 Encephalopathy, unspecified: Secondary | ICD-10-CM | POA: Diagnosis present

## 2021-10-20 DIAGNOSIS — N1832 Chronic kidney disease, stage 3b: Secondary | ICD-10-CM | POA: Diagnosis present

## 2021-10-20 DIAGNOSIS — Z882 Allergy status to sulfonamides status: Secondary | ICD-10-CM

## 2021-10-20 DIAGNOSIS — Z79899 Other long term (current) drug therapy: Secondary | ICD-10-CM

## 2021-10-20 DIAGNOSIS — Z923 Personal history of irradiation: Secondary | ICD-10-CM

## 2021-10-20 DIAGNOSIS — M109 Gout, unspecified: Secondary | ICD-10-CM | POA: Diagnosis present

## 2021-10-20 DIAGNOSIS — Z6834 Body mass index (BMI) 34.0-34.9, adult: Secondary | ICD-10-CM

## 2021-10-20 DIAGNOSIS — G4733 Obstructive sleep apnea (adult) (pediatric): Secondary | ICD-10-CM | POA: Diagnosis present

## 2021-10-20 LAB — APTT
aPTT: 115 seconds — ABNORMAL HIGH (ref 24–36)
aPTT: 61 seconds — ABNORMAL HIGH (ref 24–36)
aPTT: 61 seconds — ABNORMAL HIGH (ref 24–36)

## 2021-10-20 LAB — CULTURE, BLOOD (ROUTINE X 2)
Culture: NO GROWTH
Special Requests: ADEQUATE

## 2021-10-20 LAB — GLUCOSE, CAPILLARY
Glucose-Capillary: 89 mg/dL (ref 70–99)
Glucose-Capillary: 83 mg/dL (ref 70–99)
Glucose-Capillary: 109 mg/dL — ABNORMAL HIGH (ref 70–99)

## 2021-10-20 LAB — CBC
HCT: 41.1 % (ref 39.0–52.0)
Hemoglobin: 13.6 g/dL (ref 13.0–17.0)
MCH: 31 pg (ref 26.0–34.0)
MCHC: 33.1 g/dL (ref 30.0–36.0)
MCV: 93.6 fL (ref 80.0–100.0)
Platelets: 261 10*3/uL (ref 150–400)
RBC: 4.39 MIL/uL (ref 4.22–5.81)
RDW: 14.1 % (ref 11.5–15.5)
WBC: 7.1 10*3/uL (ref 4.0–10.5)
nRBC: 0 % (ref 0.0–0.2)

## 2021-10-20 LAB — HEPATIC FUNCTION PANEL
ALT: 69 U/L — ABNORMAL HIGH (ref 0–44)
AST: 55 U/L — ABNORMAL HIGH (ref 15–41)
Albumin: 3.3 g/dL — ABNORMAL LOW (ref 3.5–5.0)
Alkaline Phosphatase: 68 U/L (ref 38–126)
Bilirubin, Direct: 0.3 mg/dL — ABNORMAL HIGH (ref 0.0–0.2)
Indirect Bilirubin: 1.1 mg/dL — ABNORMAL HIGH (ref 0.3–0.9)
Total Bilirubin: 1.4 mg/dL — ABNORMAL HIGH (ref 0.3–1.2)
Total Protein: 7.3 g/dL (ref 6.5–8.1)

## 2021-10-20 LAB — BASIC METABOLIC PANEL
Anion gap: 10 (ref 5–15)
BUN: 30 mg/dL — ABNORMAL HIGH (ref 6–20)
CO2: 21 mmol/L — ABNORMAL LOW (ref 22–32)
Calcium: 9.6 mg/dL (ref 8.9–10.3)
Chloride: 108 mmol/L (ref 98–111)
Creatinine, Ser: 1.59 mg/dL — ABNORMAL HIGH (ref 0.61–1.24)
GFR, Estimated: 52 mL/min — ABNORMAL LOW (ref >60)
Glucose, Bld: 106 mg/dL — ABNORMAL HIGH (ref 70–99)
Potassium: 4.3 mmol/L (ref 3.5–5.1)
Sodium: 139 mmol/L (ref 135–145)

## 2021-10-20 LAB — HIV-1/2 AB - DIFFERENTIATION
HIV 1 Ab: NONREACTIVE
HIV 2 Ab: NONREACTIVE
Note: NEGATIVE

## 2021-10-20 LAB — HIV-1/HIV-2 QUALITATIVE RNA
Final Interpretation: NEGATIVE
HIV-1 RNA, Qualitative: NONREACTIVE
HIV-2 RNA, Qualitative: NONREACTIVE

## 2021-10-20 LAB — MAGNESIUM: Magnesium: 2.1 mg/dL (ref 1.7–2.4)

## 2021-10-20 LAB — HEPARIN LEVEL (UNFRACTIONATED): Heparin Unfractionated: >1.1 IU/mL — ABNORMAL HIGH (ref 0.30–0.70)

## 2021-10-20 MED ORDER — SEMAGLUTIDE (2 MG/DOSE) 8 MG/3ML ~~LOC~~ SOPN: 2.0000 mg | PEN_INJECTOR | SUBCUTANEOUS | Status: DC

## 2021-10-20 MED ORDER — ONDANSETRON HCL 4 MG/2ML IJ SOLN: 4.0000 mg | Freq: Four times a day (QID) | INTRAMUSCULAR | Status: DC | PRN

## 2021-10-20 MED ORDER — THIAMINE HCL 100 MG/ML IJ SOLN: 100.0000 mg | Freq: Every day | INTRAMUSCULAR | Status: DC

## 2021-10-20 MED ORDER — SODIUM CHLORIDE 0.9 % IV SOLN
100.0000 mg | Freq: Two times a day (BID) | INTRAVENOUS | Status: DC
  Administered 2021-10-20 – 2021-10-21 (×2): 100 mg via INTRAVENOUS
  Filled 2021-10-20 (×3): qty 10

## 2021-10-20 MED ORDER — ACETAMINOPHEN 325 MG PO TABS
650.0000 mg | ORAL_TABLET | Freq: Four times a day (QID) | ORAL | Status: DC | PRN
  Administered 2021-10-24: 650 mg via ORAL
  Filled 2021-10-20: qty 2

## 2021-10-20 MED ORDER — LORAZEPAM 1 MG PO TABS: 1.0000 mg | ORAL_TABLET | ORAL | Status: AC | PRN

## 2021-10-20 MED ORDER — FLECAINIDE ACETATE 100 MG PO TABS
100.0000 mg | ORAL_TABLET | Freq: Two times a day (BID) | ORAL | Status: DC
  Administered 2021-10-20 – 2021-10-25 (×10): 100 mg via ORAL
  Filled 2021-10-20 (×11): qty 1

## 2021-10-20 MED ORDER — LACOSAMIDE 50 MG PO TABS: 100.0000 mg | ORAL_TABLET | Freq: Two times a day (BID) | ORAL | Status: DC

## 2021-10-20 MED ORDER — SODIUM CHLORIDE 0.9 % IV SOLN
100.0000 mg | Freq: Two times a day (BID) | INTRAVENOUS | Status: DC
  Filled 2021-10-20: qty 10

## 2021-10-20 MED ORDER — THIAMINE HCL 100 MG PO TABS
100.0000 mg | ORAL_TABLET | Freq: Every day | ORAL | Status: DC
  Administered 2021-10-20 – 2021-10-25 (×6): 100 mg via ORAL
  Filled 2021-10-20 (×6): qty 1

## 2021-10-20 MED ORDER — LEVETIRACETAM IN NACL 1000 MG/100ML IV SOLN
1000.0000 mg | Freq: Two times a day (BID) | INTRAVENOUS | Status: DC
  Filled 2021-10-20: qty 100

## 2021-10-20 MED ORDER — SODIUM CHLORIDE 0.9 % IV SOLN
5.0000 mg/kg | Freq: Two times a day (BID) | INTRAVENOUS | Status: DC
  Filled 2021-10-20: qty 9.3

## 2021-10-20 MED ORDER — APIXABAN 5 MG PO TABS
5.0000 mg | ORAL_TABLET | Freq: Two times a day (BID) | ORAL | Status: DC
  Administered 2021-10-20 – 2021-10-21 (×3): 5 mg via ORAL
  Filled 2021-10-20 (×3): qty 1

## 2021-10-20 MED ORDER — ACETAMINOPHEN 650 MG RE SUPP: 650.0000 mg | Freq: Four times a day (QID) | RECTAL | Status: DC | PRN

## 2021-10-20 MED ORDER — ONDANSETRON HCL 4 MG PO TABS: 4.0000 mg | ORAL_TABLET | Freq: Four times a day (QID) | ORAL | Status: DC | PRN

## 2021-10-20 MED ORDER — HEPARIN (PORCINE) 25000 UT/250ML-% IV SOLN
1600.0000 [IU]/h | INTRAVENOUS | Status: DC
  Administered 2021-10-20: 1400 [IU]/h via INTRAVENOUS
  Filled 2021-10-20: qty 250

## 2021-10-20 MED ORDER — LEVETIRACETAM IN NACL 1500 MG/100ML IV SOLN
1500.0000 mg | Freq: Two times a day (BID) | INTRAVENOUS | Status: DC
  Administered 2021-10-20: 1500 mg via INTRAVENOUS
  Filled 2021-10-20 (×2): qty 100

## 2021-10-20 MED ORDER — ATORVASTATIN CALCIUM 40 MG PO TABS
40.0000 mg | ORAL_TABLET | Freq: Every day | ORAL | Status: DC
  Administered 2021-10-21 – 2021-10-25 (×5): 40 mg via ORAL
  Filled 2021-10-20 (×5): qty 1

## 2021-10-20 MED ORDER — LEVETIRACETAM IN NACL 1500 MG/100ML IV SOLN
1500.0000 mg | Freq: Two times a day (BID) | INTRAVENOUS | Status: DC
  Administered 2021-10-21: 1500 mg via INTRAVENOUS
  Filled 2021-10-20 (×2): qty 100

## 2021-10-20 MED ORDER — SODIUM BICARBONATE 650 MG PO TABS
650.0000 mg | ORAL_TABLET | Freq: Two times a day (BID) | ORAL | Status: DC
  Administered 2021-10-20 – 2021-10-25 (×10): 650 mg via ORAL
  Filled 2021-10-20 (×10): qty 1

## 2021-10-20 MED ORDER — FOLIC ACID 1 MG PO TABS
1.0000 mg | ORAL_TABLET | Freq: Every day | ORAL | Status: DC
  Administered 2021-10-20 – 2021-10-25 (×6): 1 mg via ORAL
  Filled 2021-10-20 (×6): qty 1

## 2021-10-20 MED ORDER — LACOSAMIDE 50 MG PO TABS
200.0000 mg | ORAL_TABLET | Freq: Once | ORAL | Status: AC
  Administered 2021-10-20: 200 mg via ORAL
  Filled 2021-10-20: qty 4

## 2021-10-20 MED ORDER — LORAZEPAM 2 MG/ML IJ SOLN: 1.0000 mg | INTRAMUSCULAR | Status: AC | PRN

## 2021-10-20 MED ORDER — SODIUM CHLORIDE 0.9 % IV SOLN
2.5000 mg/kg | Freq: Two times a day (BID) | INTRAVENOUS | Status: DC
  Administered 2021-10-20: 290 mg via INTRAVENOUS
  Filled 2021-10-20 (×3): qty 5.8

## 2021-10-20 MED ORDER — ADULT MULTIVITAMIN W/MINERALS CH
1.0000 | ORAL_TABLET | Freq: Every day | ORAL | Status: DC
  Administered 2021-10-20 – 2021-10-25 (×6): 1 via ORAL
  Filled 2021-10-20 (×6): qty 1

## 2021-10-20 MED ORDER — CLOMIPHENE CITRATE 50 MG PO TABS
25.0000 mg | ORAL_TABLET | Freq: Every day | ORAL | Status: DC
  Filled 2021-10-20 (×9): qty 1

## 2021-10-20 MED ORDER — HEPARIN BOLUS VIA INFUSION
1500.0000 [IU] | Freq: Once | INTRAVENOUS | Status: AC
Start: 2021-10-20 — End: 2021-10-20
  Administered 2021-10-20: 1500 [IU] via INTRAVENOUS
  Filled 2021-10-20: qty 1500

## 2021-10-20 NOTE — Assessment & Plan Note (Signed)
Cont clomiphine

## 2021-10-20 NOTE — Assessment & Plan Note (Addendum)
Creat 1.6 today, looks like creat slowly improving during hospital stay at [facility]. 1.6 is presumably near baseline. 1. Cont to trend renal function. 2. Cont PO bicarb

## 2021-10-20 NOTE — H&P (Signed)
History and Physical    Patient: Dustin Wells ALP:[id number] DOB: [birth year] DOA: 10/20/2021 DOS: the patient was seen and examined on 10/20/2021 PCP: Leonel Ramsay, MD  Patient coming from: Outside Hospital  Chief Complaint: Encephalitis  HPI: Dustin Wells is a 56 y.o. male with medical history significant of hypertension, hyperlipidemia, prediabetes, GERD, gout, depression with anxiety, testicular cancer, diastolic CHF, atrial fibrillation on Eliquis, CKD-3B, alcohol abuse, OSA on CPAP, obesity with BMI 35.94, syncope, thrombocytopenia, who presented to [facility] on 7/28 with altered mental status, shortness breath, abdominal pain and distention, fever.  Patient was recently hospitalized from 7/26-7/27 due to syncope.  Patient had MRI for brain on 7/27 which showed chronic right MCA infarction, but no acute intracranial abnormalities.  Patient had 2D echo on 7/27 which showed EF of 55-60%.  His syncope was thought possibly due to hypovolemia.  Patient was treated with IV fluid with improvement.  At [facility]: Pt initially met sepsis criteria, concern for CNS infection.  LP performed 7/28 showing 804 WBC, 103 TP, 54 glucose.  On diff 75% segs.  Pt started on amp, ceftriaxone ( then changed to ceftaroline) vancomycin /acyclovir . 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  HHV6 in csf was unusual as he was not immunecompromised currently HHV6 quantitative PCR was sent both in csf and blood an ganciclovir continued and also ceftaroline was changed to meropenem.  Pt transferred to [facility] for EEG monitoring due to concerns he was having focal seizures causing muscle twitching.  Merrem DCd on 8/1 by ID.    Review of Systems: As mentioned in the history of present illness. All other systems reviewed and are negative. Past Medical History:  Diagnosis Date   Atrial fibrillation (HCC)    CHF  (congestive heart failure) ([place])    Hypertension    Renal disorder    Stroke [facility])    Testicular cancer [facility])    Past Surgical History:  Procedure Laterality Date   cardiac albation     CARDIAC ELECTROPHYSIOLOGY MAPPING AND ABLATION     Social History:  reports that he has never smoked. He has never used smokeless tobacco. He reports current alcohol use of about 21.0 standard drinks of alcohol per week. He reports that he does not currently use drugs.  Allergies  Allergen Reactions   Rivaroxaban Diarrhea, Nausea Only and Other (See Comments)    Nausea, diarrhea, abdominal pain Nausea, diarrhea, abdominal pain Nausea, diarrhea, abdominal pain    Sulfamethoxazole-Trimethoprim Rash    Family History  Problem Relation Age of Onset   Hypertension Mother    Heart disease Father     Prior to Admission medications   Medication Sig Start Date End Date Taking? Authorizing Provider  acetaminophen (TYLENOL) 325 MG tablet Take 650 mg by mouth every 6 (six) hours as needed for mild pain.    [provider]  allopurinol (ZYLOPRIM) 300 MG tablet Take 300 mg by mouth daily.    [provider]  amLODipine (NORVASC) 5 MG tablet Take 5 mg by mouth daily. 08/04/20   [provider]  apixaban (ELIQUIS) 5 MG TABS tablet Take 5 mg by mouth 2 (two) times daily. 03/27/19   [provider]  atorvastatin (LIPITOR) 40 MG tablet Take 40 mg by mouth daily. 02/05/19   [provider]  Cholecalciferol 25 MCG (1000 UT) tablet Take 1,000 Units by mouth daily.    [provider]  clomiPHENE (CLOMID) 50 MG tablet Take 1/2 tablet daily Patient taking differently: Take 25 mg by mouth at bedtime. Take 1/2 tablet daily 09/29/21   Hollice Espy, MD  cyanocobalamin 1000 MCG tablet Take 1,000 mcg by mouth daily.    [provider]  flecainide (TAMBOCOR) 100 MG tablet Take 100 mg by mouth every 12 (twelve) hours. 02/27/19   [provider]  folic acid  (FOLVITE) 1 MG tablet Take by mouth. 03/12/19   [provider]  gabapentin (NEURONTIN) 300 MG capsule Take 1-3 tabs at night 01/05/21   [provider]  ganciclovir 290 mg in sodium chloride 0.9 % 100 mL Inject 290 mg into the vein every 12 (twelve) hours. 10/19/21   Lorella Nimrod, MD  LORazepam (ATIVAN) 2 MG tablet Take 2 mg by mouth at bedtime as needed. 09/30/21   [provider]  losartan (COZAAR) 100 MG tablet Take 1 tablet (100 mg total) by mouth daily. Hold until you see your nephrologist 10/19/21   Lorella Nimrod, MD  magnesium oxide (MAG-OX) 400 (240 Mg) MG tablet Take 1 tablet by mouth 2 (two) times daily. 10/02/21   [provider]  metoprolol succinate (TOPROL-XL) 100 MG 24 hr tablet Take 1 tablet (100 mg total) by mouth daily. hold 10/19/21   Lorella Nimrod, MD  Multiple Vitamin (MULTIVITAMIN WITH MINERALS) TABS tablet Take 1 tablet by mouth daily. 10/20/21   Lorella Nimrod, MD  OZEMPIC, 2 MG/DOSE, 8 MG/3ML SOPN Inject 2 mg into the skin once a week. Monday 10/12/21   [provider]  pantoprazole (PROTONIX) 40 MG tablet Take 40 mg by mouth daily. 11/17/20   [provider]  sildenafil (REVATIO) 20 MG tablet Take 3-5 tablets 1 hr prior to intercourse as needed Patient taking differently: Take 60-100 mg by mouth every hour as needed (Prior to intercourse). Take 3-5 tablets 1 hr prior to intercourse as needed 09/29/21   Hollice Espy, MD  sodium bicarbonate 650 MG tablet Take 650 mg by mouth 2 (two) times daily. 05/15/19   [provider]  thiamine (VITAMIN B1) 100 MG tablet Take 1 tablet (100 mg total) by mouth daily. 10/20/21   Lorella Nimrod, MD  thiamine (VITAMIN B1) 100 MG/ML injection Inject 1 mL (100 mg total) into the vein daily. 10/20/21   Lorella Nimrod, MD  torsemide (DEMADEX) 10 MG tablet Take 1 tablet (10 mg total) by mouth daily. Hold until you see your nephrologist 10/14/21   Lorella Nimrod, MD    Physical Exam: Vitals:   10/20/21  1900  BP: 119/88  Pulse: 82  Resp: 14  Temp: 98.2 F (36.8 C)  TempSrc: Oral  SpO2: 100%   Constitutional: NAD, calm, comfortable Eyes: PERRL, lids and conjunctivae normal ENMT: Mucous membranes are moist. Posterior pharynx clear of any exudate or lesions.Normal dentition.  Neck: normal, supple, no masses, no thyromegaly Respiratory: clear to auscultation bilaterally, no wheezing, no crackles. Normal respiratory effort. No accessory muscle use.  Cardiovascular: Regular rate and rhythm, no murmurs / rubs / gallops. No extremity edema. 2+ pedal pulses. No carotid bruits.  Abdomen: no tenderness, no masses palpated. No hepatosplenomegaly. Bowel sounds positive.  Musculoskeletal: no clubbing / cyanosis. No joint deformity upper and lower extremities. Good ROM, no contractures. Normal muscle tone.  Skin: no rashes, lesions, ulcers. No induration Neurologic: CN 2-12 grossly intact. Sensation intact, DTR normal. Strength 5/5 in all 4.  Psychiatric: Normal judgment and insight. Alert and oriented x 3. Normal mood.  Data Reviewed:       Latest Ref Rng & Units 10/20/2021    4:37 AM 10/19/2021    4:34 PM 10/16/2021    7:23 AM  CBC  WBC 4.0 - 10.5 K/uL 7.1  6.9  10.9   Hemoglobin 13.0 - 17.0 g/dL 13.6  13.0  12.8   Hematocrit 39.0 - 52.0 % 41.1  39.5  36.9   Platelets 150 - 400 K/uL 261  229  129       Latest Ref Rng & Units 10/20/2021    4:37 AM 10/19/2021    5:11 AM 10/17/2021   10:20 AM  CMP  Glucose 70 - 99 mg/dL 106  92  195   BUN 6 - 20 mg/dL 30  33  41   Creatinine 0.61 - 1.24 mg/dL 1.59  1.68  1.95   Sodium 135 - 145 mmol/L 139  138  134   Potassium 3.5 - 5.1 mmol/L 4.3  4.0  3.4   Chloride 98 - 111 mmol/L 108  105  97   CO2 22 - 32 mmol/L _0 Calcium 8.9 - 10.3 mg/dL 9.6  9.2  8.8   Total Protein 6.5 - 8.1 g/dL 7.3     Total Bilirubin 0.3 - 1.2 mg/dL 1.4     Alkaline Phos 38 - 126 U/L 68     AST 15 - 41 U/L 55     ALT 0 - 44 U/L 69      LP: 804 WBC, 103 TP, 54  glucose.  On diff 75% segs.  CT AP: IMPRESSION: There is no evidence of intestinal obstruction or pneumoperitoneum. There is no hydronephrosis. Appendix is not dilated.   There is patchy infiltrate in left lower lung field suggesting atelectasis/pneumonia.   There is 1.3 cm soft tissue density structure in sigmoid colon which may suggest fecal material in the lumen or polyp. When the patient's clinical condition permits, endoscopy may be considered.   There is perinephric stranding around both kidneys which may be due to acute or chronic pyelonephritis or residual change from previous ureteric obstruction.   Small hiatal hernia.Diverticulosis of colon without signs of focal diverticulitis. Enlarged spleen.   Other findings as described in the body of the report.  MRI brain: IMPRESSION: 1. No acute intracranial abnormality or significant interval change. 2. Stable encephalomalacia of the right posterior temporal and parietal lobe. 3. Scattered subcortical T2 hyperintensities bilaterally are mildly advanced for age. The finding is nonspecific but can be seen in the setting of chronic microvascular ischemia, a demyelinating process such as multiple sclerosis, vasculitis, complicated migraine headaches, or as the sequelae of a prior infectious or inflammatory process.  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 Cont gaincyclovir Odd though: HHV-6 usually causes encephalitis in immunocompromised patients only... CSF cell count looks more like what id expect from bacterial meningitis. ABx stopped by ID at this time Re-consult ID in AM Mental status now apparently improved to baseline  Seizures ([place]) Focal seizures Neuro on board Cont EEG tonight Cont keppra + Vimpat per neuro  Sepsis ([place]) Met sepsis criteria earlier during stay. Presumably due to CNS source (see encephalitis above). No further fevers during stay Off  ABx at the moment, just on gaincyclovir for HHV-6 CNS infection. ID re-consult in AM. If re-develops sepsis symptoms or AMS overnight, restart ABx  Acute respiratory failure with hypoxia (HCC) Resolved.  Patient is now on room  air. Etiology is not clear.  May be secondary to sepsis and encephalitis.  Chest x-ray negative for effusion.  COVID PCR negative.  Patient is taking Eliquis, less likely to have PE.  Patient has history of diastolic CHF, but due to obesity, it is very difficult to assess volume status.  BNP at 256.  Patient did received IV fluid per sepsis protocol. He was given some IV Lasix for 1 day-appears euvolemic  -Bronchodilators  Alcohol use disorder, severe, dependence (HCC) CIWA protocol  Atrial fibrillation, chronic (HCC) Cont eliquis Cont flecainide  Chronic diastolic CHF (congestive heart failure) ([place]) 2D echo on 10/14/2021 showed EF 55-60%. Clinically appears euvolemic.  Mild worsening of azotemia, creatinine seems improving.  Received some IV Lasix. -Continue to monitor.  Stage 3b chronic kidney disease (CKD) (HCC) Creat 1.6 today, looks like creat slowly improving during hospital stay at [facility]. 1.6 is presumably near baseline. Cont to trend renal function. Cont PO bicarb  HTN (hypertension) Cozaar and amlodipine on hold  Testicular cancer (HCC) Cont clomiphine  HLD (hyperlipidemia) Cont lipitor  Thrombocytopenia (HCC) Chronic due to EtOH abuse. Resolved at this time.  Abdominal distention ? Polyp on CT Perinephric stranding on CT Needs colonoscopy as outpt. Had 4 days of broad spectrum ABx, should have been adequate to cover UTI if he had   Obesity with body mass index of 30.0-39.9 Continue ozempic      Advance Care Planning:   Code Status: Full Code  Consults: Neuro consulted  Family Communication: No family in room  Severity of Illness: The appropriate patient status for this patient is INPATIENT. Inpatient status is judged to be  reasonable and necessary in order to provide the required intensity of service to ensure the patient's safety. The patient's presenting symptoms, physical exam findings, and initial radiographic and laboratory data in the context of their chronic comorbidities is felt to place them at high risk for further clinical deterioration. Furthermore, it is not anticipated that the patient will be medically stable for discharge from the hospital within 2 midnights of admission.   * I certify that at the point of admission it is my clinical judgment that the patient will require inpatient hospital care spanning beyond 2 midnights from the point of admission due to high intensity of service, high risk for further deterioration and high frequency of surveillance required.*  Author: Etta Quill., DO 10/20/2021 9:09 PM  For on call review [email or web address].

## 2021-10-20 NOTE — Procedures (Signed)
Patient Name: Dustin Wells  MRN: [id number]  Epilepsy Attending: Lora Havens  Referring Physician/Provider: Lorenza Chick, MD  Date: 10/20/2021 Duration: 24.37 mins   Patient history: 56 year old male with altered mental status.  EEG to evaluate for seizure.   Level of alertness: asleep   AEDs during EEG study: LEV, LCM   Technical aspects: This EEG study was done with scalp electrodes positioned according to the 10-20 International system of electrode placement. Electrical activity was reviewed with band pass filter of 1-'70Hz'$ , sensitivity of 7 uV/mm, display speed of 30m/sec with a '60Hz'$  notched filter applied as appropriate. EEG data were recorded continuously and digitally stored.  Video monitoring was available and reviewed as appropriate.   Description:  Sleep was characterized by sleep spindles (12 to 14 Hz), maximal frontocentral region.  Periodic discharges with overriding fast activity were noted in right parieto-occipital region at '1hz'$  which at times appeared rhythmic lasting 3-9 seconds consistent with brief-ictal-interictal rhythmic discharges. One electrographic seizures was also noted during which EEG showed evolution in morphology, frequency and involved vertex region. No clinical signs were noted. Seizure was at 1602, lasting 11 seconds. Hyperventilation and photic stimulation were not performed.      ABNORMALITY - Electrographic seizure, right parieto-occipital region - Brief-ictal-interictal rhythmic discharges, right parieto-occipital region - Periodic discharges with overriding fast activity, right parieto-occipital regio   IMPRESSION: This study showed one  electrographic seizures arising from right parieto-occipital region at 1602, lasting about 11 seconds each. There is also evidence of epileptogenicity with increased risk of seizure recurrence.     Dr. BCurly Shoreswas notified.  Dustin Wells

## 2021-10-20 NOTE — Progress Notes (Signed)
Pharmacy Antibiotic Note  Dustin Wells is a 56 y.o. male admitted on 10/15/2021 with  rule out HHV-6 encephalitis .  Pharmacy has been consulted for ganciclovir dosing.  Today, 10/20/2021 Day #4 ganciclovir CSF: glucose 54, WBC 804 (75% neut, 19% lymph), protein 103 7/28 CSF cx: NGTD Meningitis/encephalitis panel: Human Herpesvirus-6 detected Weekend ID coverage started ganciclovir. Unusual pathogen in immunocompetent patient Afebrile WBC WNL Renal: SCr improving HIV testing: Non-reactive RPR: Non-reactive VDRL: Non-reactive Cryptococcal Ag: neg HSV 1/2 PCR CSF: neg Plan: Renal function has improved where [place]-G[place] CrCl is now ? 30m/min, adjust Ganciclovir to '465mg'$  ('5mg'$ /kg) IV q12h starting 8/2 PM.  per Adjust BW for BMI > 30 Monitor renal function and CBC Awaiting HHV-6 quantitative PCR from CSF and serum (sent to [facility]) to decide to stop ganciclovir in setting of concern for seizures on EEG.   Weight: 115.6 kg (254 lb 12.8 oz)  Temp (24hrs), Avg:98 F (36.7 C), Min:97.4 F (36.3 C), Max:98.5 F (36.9 C)  Recent Labs  Lab 10/13/21 1427 10/13/21 1629 10/14/21 0652 10/15/21 0946 10/15/21 0949 10/15/21 1023 10/15/21 1702 10/15/21 2002 10/16/21 0723 10/17/21 1020 10/19/21 0511 10/19/21 1634 10/20/21 0437  WBC  --   --    < >  --  8.7 8.6  --   --  10.9*  --   --  6.9 7.1  CREATININE  --   --    < >  --  2.67* 2.05*  --   --  2.09* 1.95* 1.68*  --  1.59*  LATICACIDVEN 2.3* 1.1  --  1.6  --   --  1.1 0.9  --   --   --   --   --    < > = values in this interval not displayed.    Estimated Creatinine Clearance: 70.5 mL/min (A) (by C-G formula based on SCr of 1.59 mg/dL (H)).    Allergies  Allergen Reactions   Rivaroxaban Diarrhea, Nausea Only and Other (See Comments)    Nausea, diarrhea, abdominal pain Nausea, diarrhea, abdominal pain Nausea, diarrhea, abdominal pain    Sulfamethoxazole-Trimethoprim Rash    Antimicrobials this admission: 7/28 vanco x1 7/28  acyclovir >>7/29 7/28 amp/sulb >> 7/29 7/28 Ceftriaxone >> 7/29 7/29 ceftaroline >> 7/31 7/31 meropenem >> 8/1 7/29 ganciclovir >>  Dose adjustments this admission:   Microbiology results: 8/1 BCx: NGTD 7/30 Bcx: NGTD 7/28 Bcx: NGTD 7/28 CSF: NGTD 7/28 UCx: NG  7/28 HHV-6 quant CSF: pending 8/1 HHV-6 quant PCR serum: pending  Thank you for allowing pharmacy to be a part of this patient's care.  DDoreene Eland PharmD, BCPS, BCIDP Work Cell: [phone]/04/2021 9:07 AM

## 2021-10-20 NOTE — Assessment & Plan Note (Signed)
CIWA protocol

## 2021-10-20 NOTE — Assessment & Plan Note (Addendum)
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 1. Cont gaincyclovir 2. Odd though: 1. HHV-6 usually causes encephalitis in immunocompromised patients only... 2. CSF cell count looks more like what id expect from bacterial meningitis. 3. ABx stopped by ID at this time 1. Re-consult ID in AM 4. Mental status now apparently improved to baseline

## 2021-10-20 NOTE — Progress Notes (Signed)
Eeg done 

## 2021-10-20 NOTE — Progress Notes (Signed)
ANTICOAGULATION CONSULT NOTE - Follow up  Pharmacy Consult for heparin infusion Indication: atrial fibrillation  Allergies  Allergen Reactions   Rivaroxaban Diarrhea, Nausea Only and Other (See Comments)    Nausea, diarrhea, abdominal pain Nausea, diarrhea, abdominal pain Nausea, diarrhea, abdominal pain    Sulfamethoxazole-Trimethoprim Rash    Patient Measurements: Weight: 115.6 kg (254 lb 12.8 oz) IBW 77.6 kg Heparin Dosing Weight: 102.6kg  Vital Signs: Temp: 97.6 F (36.4 C) (08/02 1126) Temp Source: Oral (08/02 1126) BP: 138/94 (08/02 1126) Pulse Rate: 74 (08/02 1126)  Labs: Recent Labs    10/19/21 0511 10/19/21 1634 10/19/21 1748 10/19/21 1748 10/19/21 2339 10/20/21 0437 10/20/21 0724 10/20/21 1650  HGB  --  13.0  --   --   --  13.6  --   --   HCT  --  39.5  --   --   --  41.1  --   --   PLT  --  229  --   --   --  261  --   --   APTT  --   --  34   < > 115*  --  61* 61*  HEPARINUNFRC  --   --  >1.10*  --   --   --  >1.10*  --   CREATININE 1.68*  --   --   --   --  1.59*  --   --    < > = values in this interval not displayed.    Estimated Creatinine Clearance: 70.5 mL/min (A) (by C-G formula based on SCr of 1.59 mg/dL (H)).  Medical History: Past Medical History:  Diagnosis Date   Atrial fibrillation (HCC)    CHF (congestive heart failure) ([place])    Hypertension    Renal disorder    Stroke [facility])    Testicular cancer (HCC)     Medications:  PTA: Eliquis '5mg'$  BID (last dose 8/1 at 9 AM) Inpatient: 7/28 Eliquis > 8/1 > Heparin infusion >  Allergies: Pat reports xarelto causing Nausea, diarrhea, abdominal pain  Assessment: 56 year old male  with EE concerning for subclinical seizures. Pharmacy consulted to transition apixaban to heparin drip in case repeat lumbar puncture to confirm no HSV is needed  Goal of Therapy:  Heparin level 0.3-0.7 units/ml aPTT 66-102 seconds Monitor platelets by anticoagulation protocol:  Yes  Date Time aPTT/HL Rate/Comment 8/1 2339 aPTT 115 Held x1 hr, then resume at 1400un/hr 8/1 0724 aPTT 61 Slightly subtherapeutic @ 1400 un/hr 8/2 1650 aPTT 61 Slightly subtherapeutic will increase from 1500 units/hr to 1600 units/hr  Plan:  8/2 1650 aPTT 61 Slightly subtherapeutic will increase from 1500 units/hr to 1600 units/hr  Dosing weight CORRECTED aPTT slightly subtherapeutic , will increase heparin rate to 1600 units/hr Repeat aPTT in 6 hrs Daily CBC to monitor hgb and plt    Dustin Wells PharmD Clinical Pharmacist 10/20/2021

## 2021-10-20 NOTE — Progress Notes (Signed)
Patient transferring to [facility] [id number], transported by [facility]. Report given to [facility] Staff and family at bedside aware of plans to transfer.   Holman Bonsignore, Tivis Ringer, RN

## 2021-10-20 NOTE — Progress Notes (Signed)
LTM EEG hooked up and running - no initial skin breakdown - push button tested - neuro notified. [facility] monitoring.  

## 2021-10-20 NOTE — Progress Notes (Signed)
PROGRESS NOTE    Dustin Wells  ZOX:[id number] DOB: 05-May-1965 DOA: 10/15/2021 PCP: Leonel Ramsay, MD   Brief Narrative:  This 56 y.o. male with medical history significant of hypertension, hyperlipidemia, prediabetes, GERD, gout, depression with anxiety, testicular cancer, diastolic CHF, atrial fibrillation on Eliquis, CKD-3B, alcohol abuse, OSA on CPAP, obesity with BMI 35.94, syncope, thrombocytopenia, who presents with altered mental status, shortness breath, abdominal pain and distention, fever.   7/29: LP was done for the concern of meningitis and it shows elevated leukocytosis at 804, protein of 103 and glucose of 54.  Met sepsis criteria, ID was consulted.  Antibiotics switched to Rocephin 2 g twice daily.  ID also recommended to check CSF syphilis and cryptococcal antigen.  He is also on Unasyn, vancomycin and acyclovir. Cryptococcal antigen negative, procalcitonin at 1.12, renal function with some improvement as compared to recent hospitalization, creatinine at 2.09 which is close to his baseline.  Preliminary CSF cultures negative.  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 HSV and VDRL pending.  Fungal cultures pending. ID advised to start him on ganciclovir. HIV labs are pending as HHV-6 encephalitis is more common in immunocompromised.   7/30: Patient remained stable.  Currently on ganciclovir and Teflaro.  MRI brain was done yesterday by ID and it was negative for any acute intracranial abnormalities or significant interval changes.  Did show stable encephalomalacia of right posterior temporal and parietal lobe.  There was Scattered subcortical T2 hyperintensities bilaterally are mildly advanced for age. The finding is nonspecific but can be seen in the setting of chronic microvascular ischemia, a demyelinating process such as multiple sclerosis, vasculitis, complicated migraine headaches, or as the sequelae of a prior infectious or inflammatory process. CSF  cultures remain negative for any bacterial growth. ID sent quantitative PCR for HHV-6 as advised by ID at [facility], Dr. Frances Maywood, as HHV-6 encephalitis is very rare in immunocompetent patients.  HIV, VDRL and RPR labs are pending.   7/31: Patient remained stable.  RPR negative, HSV 1-2 negative, fungal cultures negative.  Bacterial culture with no growth.  Rest of the labs still pending. Per ID patient will be in hospital for the next couple of days as they would like all the labs back before making final decision in terms of antiviral management.     Addendum.  Neurology evaluated him and for concern of intermittent spasms involving his hand  EEG was ordered which was positive for multiple subclinical seizures.  Patient was given Ativan and loaded with Keppra.  Neurology recommended to transfer to [facility] for continuous EEG for better titration of his seizure medications.  Antibiotics were discontinued by ID and he will continue with ganciclovir at this time.   Patient is being transferred to [facility] for further management and continuous EEG.  He will be going under hospitalist care with a consult to neurology and infectious disease.  Patient is still awaiting bed at [facility].  Patient continued to have ongoing seizures on EEG, Keppra dose is increased , Patient is started on Vimpat.  Assessment & Plan:   Principal Problem:   Encephalitis Active Problems:   Acute metabolic encephalopathy   Sepsis ([place])   Acute respiratory failure with hypoxia (HCC)   Chronic diastolic CHF (congestive heart failure) (HCC)   Alcohol use disorder, severe, dependence (HCC)   Atrial fibrillation, chronic (HCC)   HTN (hypertension)   Stage 3b chronic kidney disease (CKD) ([place])   Testicular cancer ([place])   Depression with anxiety  HLD (hyperlipidemia)   Thrombocytopenia (HCC)   OSA (obstructive sleep apnea)   Obesity with body mass index of 30.0-39.9   Abnormal LFTs   Abdominal distention   Sepsis  ([place]) Patient met criteria for sepsis with fever 100.6, heart rate of 108, RR 33.  Lactic acid 1.6.   Since patient has altered mental status,  LP was performed by IR to r/o meningitis.  Which shows leukocytosis with 75% neutrophils, elevated protein and borderline low glucose. Meningitis/encephalitis panel positive for HH V6.  Which is very rare to cause encephalitis in immunocompetent patients.  Patient is a chronic alcoholic, never received any chemotherapy, only radiation therapy and on clomiphene for testicular cancer. Fungal cultures negative, HSV 1-2 negative, RPR negative.  HHV-6 quantitative PCR was also sent. Patient initially received broad-spectrum antibiotics with multiple agents which include cefepime followed by ceftriaxone, Augmentin, acyclovir and vancomycin. Infectious diseases following.  Sepsis physiology is improving -Currently on ganciclovir and ceftaroline.     Acute metabolic encephalopathy Resolved.  Appears to be at baseline now. 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  MRI brain was also without any acute abnormality.  Multiple chronic hypodense lesions which can be due to some prior infection/inflammation or a demyelinating process. -Continue to monitor mental status.    Acute respiratory failure with hypoxia (HCC) Resolved.  Patient is now on room air. Etiology is not clear.  May be secondary to sepsis and encephalitis.  Chest x-ray negative for effusion.  COVID PCR negative. Patient is taking Eliquis, less likely to have PE.  Patient has history of diastolic CHF, but due to obesity, it is very difficult to assess volume status.  BNP at 256.  Patient did received IV fluid per sepsis protocol. He was given some IV Lasix for 1 day-appears euvolemic  Continue Bronchodilators   Chronic diastolic CHF (congestive heart failure) ([place]) 2D echo on  10/14/2021 showed EF 55-60%. Clinically appears euvolemic.  Mild worsening of azotemia, creatinine seems improving.  Received some IV Lasix. Continue to monitor.   Alcohol use disorder, severe, dependence ([place]) Continue CIWA protocol.   Atrial fibrillation, chronic (HCC) Continue flecainide 100 mg twice daily Restarted Eliquis-initially held for LP   HTN (hypertension) Blood pressure mostly within goal. Hold  Cozaar and amlodipine Continue IV hydralazine as needed   Stage 3b chronic kidney disease (CKD) (HCC) Improving with creatinine at 1.90 , at baseline. -Follow-up renal function by BMP   Testicular cancer (HCC) Continue clomiphene   Depression with anxiety Continue home as needed Ativan which also help alcohol withdrawal   HLD (hyperlipidemia) Continue Lipitor   Thrombocytopenia ([place]) This is a chronic issue.  Platelets are 129 (91 on 10/14/2021), likely due to alcohol abuse.  No active bleeding. Monitor H&H.   OSA (obstructive sleep apnea) Continue CPAP at night   Obesity with body mass index of 30.0-39.9  BMI= 35.94  and BW= 120.2 -Diet and exercise.   -Encourage to lose weight.     Abdominal distention: Pt has central abdominal tenderness and abdominal distention, etiology is not clear.  Lipase 78 CT abdomen and pelvis showed   There is 1.3 cm soft tissue density structure in sigmoid colon which may suggest fecal material in the lumen or polyp. When the patient's clinical condition permits, endoscopy may be considered.   There is perinephric stranding around both kidneys which may be due to acute or chronic pyelonephritis or residual change from previous  ureteric obstruction.   Small hiatal hernia.Diverticulosis of colon without signs of focal diverticulitis. Enlarged spleen. -Supportive care   Abnormal LFTs  Likely due to alcohol abuse and ongoing infection. -Judicious use Tylenol (patient cannot use NSAID for fever due to CKD)     DVT  prophylaxis: E;iquis Code Status: Full code. Family Communication: Daughter at bed side. Disposition Plan:   Status is: Inpatient Remains inpatient appropriate because: She is being transferred to [facility] for continuous EEG.    Consultants:  Neurology  Procedures: Lumbar puncture Antimicrobials: Anti-infectives (From admission, onward)    Start     Dose/Rate Route Frequency Ordered Stop   10/20/21 2200  ganciclovir (CYTOVENE) 465 mg in sodium chloride 0.9 % 100 mL IVPB        5 mg/kg  92.8 kg (Adjusted) 100 mL/hr over 60 Minutes Intravenous Every 12 hours 10/20/21 0822     10/19/21 2200  ganciclovir (CYTOVENE) 290 mg in sodium chloride 0.9 % 100 mL IVPB        2.5 mg/kg  115.6 kg 100 mL/hr over 60 Minutes Intravenous Every 12 hours 10/19/21 1647 10/20/21 2159   10/19/21 0000  ganciclovir 290 mg in sodium chloride 0.9 % 100 mL        2.5 mg/kg  115.6 kg Intravenous Every 12 hours 10/19/21 1742     10/18/21 2200  ganciclovir (CYTOVENE) 300 mg in sodium chloride 0.9 % 100 mL IVPB  Status:  Discontinued        2.5 mg/kg  120.2 kg 100 mL/hr over 60 Minutes Intravenous Every 12 hours 10/18/21 1529 10/19/21 1551   10/18/21 1630  meropenem (MERREM) 2 g in sodium chloride 0.9 % 100 mL IVPB  Status:  Discontinued        2 g 280 mL/hr over 30 Minutes Intravenous Every 8 hours 10/18/21 1529 10/19/21 1641   10/16/21 2200  ceftaroline (TEFLARO) 600 mg in sodium chloride 0.9 % 100 mL IVPB  Status:  Discontinued        600 mg 100 mL/hr over 60 Minutes Intravenous Every 12 hours 10/16/21 1609 10/18/21 1529   10/16/21 1800  vancomycin (VANCOREADY) IVPB 1250 mg/250 mL  Status:  Discontinued        1,250 mg 166.7 mL/hr over 90 Minutes Intravenous Every 24 hours 10/15/21 1637 10/16/21 1200   10/16/21 1400  ganciclovir (CYTOVENE) 600 mg in sodium chloride 0.9 % 100 mL IVPB  Status:  Discontinued        5 mg/kg  120.2 kg 100 mL/hr over 60 Minutes Intravenous Every 12 hours 10/16/21 1207  10/18/21 1529   10/16/21 0600  cefTRIAXone (ROCEPHIN) 2 g in sodium chloride 0.9 % 100 mL IVPB  Status:  Discontinued        2 g 200 mL/hr over 30 Minutes Intravenous Every 12 hours 10/15/21 1803 10/15/21 1804   10/16/21 0600  cefTRIAXone (ROCEPHIN) 2 g in sodium chloride 0.9 % 100 mL IVPB  Status:  Discontinued       Note to Pharmacy: Dr. Tommy Medal of ID recommended to switch cefepime to 2 g of Rocephin twice daily for meningitis   2 g 200 mL/hr over 30 Minutes Intravenous Every 12 hours 10/15/21 1804 10/16/21 1214   10/15/21 2200  ceFEPIme (MAXIPIME) 2 g in sodium chloride 0.9 % 100 mL IVPB  Status:  Discontinued        2 g 200 mL/hr over 30 Minutes Intravenous Every 12 hours 10/15/21 1637 10/15/21 1803   10/15/21  1815  cefTRIAXone (ROCEPHIN) 1 g in sodium chloride 0.9 % 100 mL IVPB        1 g 200 mL/hr over 30 Minutes Intravenous  Once 10/15/21 1803 10/15/21 2232   10/15/21 1800  Ampicillin-Sulbactam (UNASYN) 3 g in sodium chloride 0.9 % 100 mL IVPB  Status:  Discontinued        3 g 200 mL/hr over 30 Minutes Intravenous Every 6 hours 10/15/21 1637 10/16/21 1209   10/15/21 1700  acyclovir (ZOVIRAX) 945 mg in dextrose 5 % 250 mL IVPB  Status:  Discontinued        10 mg/kg  94.6 kg (Adjusted) 268.9 mL/hr over 60 Minutes Intravenous Every 12 hours 10/15/21 1637 10/16/21 1200   10/15/21 1645  vancomycin (VANCOCIN) 2,500 mg in sodium chloride 0.9 % 500 mL IVPB        2,500 mg 262.5 mL/hr over 120 Minutes Intravenous  Once 10/15/21 1637 10/15/21 1955   10/15/21 1030  cefTRIAXone (ROCEPHIN) 1 g in sodium chloride 0.9 % 100 mL IVPB        1 g 200 mL/hr over 30 Minutes Intravenous  Once 10/15/21 1017 10/15/21 1105        Subjective: Patient was seen and examined at bedside.  Overnight events noted.  Patient was sitting comfortably on the on the chair,  reports having muscle twitching in the left hand.  Patient denies any chest pain,  fully awake and alert,  following  commands.  Objective: Vitals:   10/19/21 2002 10/19/21 2329 10/20/21 0741 10/20/21 1126  BP: 128/85 133/89 131/79 (!) 138/94  Pulse: 72 74 78 74  Resp: 20 20 16 16   Temp: 98.5 F (36.9 C) 98.2 F (36.8 C) (!) 97.4 F (36.3 C) 97.6 F (36.4 C)  TempSrc:   Oral Oral  SpO2: 93% 93% 96% 97%  Weight:        Intake/Output Summary (Last 24 hours) at 10/20/2021 1423 Last data filed at 10/20/2021 0600 Gross per 24 hour  Intake 1452.66 ml  Output 700 ml  Net 752.66 ml   Filed Weights   10/17/21 0446 10/18/21 0500 10/19/21 0500  Weight: 123.4 kg 117.5 kg 115.6 kg    Examination:  General exam: Appears comfortable, not in any acute distress.  Deconditioned Respiratory system: Clear to auscultation bilaterally, no wheezing, no crackles, normal respiratory effort. Cardiovascular system: S1 & S2 heard, RRR.  No murmur.   Gastrointestinal system: Abdomen is soft, non tender, non distended, BS + Central nervous system: Alert and oriented x 3. No focal neurological deficits. Extremities: No edema, no cyanosis, no clubbing. Skin: No rashes, lesions or ulcers Psychiatry: Judgement and insight appear normal. Mood & affect appropriate.     Data Reviewed: I have personally reviewed following labs and imaging studies  CBC: Recent Labs  Lab 10/15/21 0949 10/15/21 1023 10/16/21 0723 10/19/21 1634 10/20/21 0437  WBC 8.7 8.6 10.9* 6.9 7.1  NEUTROABS 6.2 7.0  --  4.3  --   HGB 13.5 13.1 12.8* 13.0 13.6  HCT 40.2 37.8* 36.9* 39.5 41.1  MCV 91.4 90.2 91.3 92.7 93.6  PLT 128* 107* 129* 229 364   Basic Metabolic Panel: Recent Labs  Lab 10/15/21 1023 10/16/21 0723 10/17/21 1020 10/19/21 0511 10/20/21 0437  NA 136 137 134* 138 139  K 3.9 3.7 3.4* 4.0 4.3  CL 105 102 97* 105 108  CO2 22 21* 25 26 21*  GLUCOSE 134* 181* 195* 92 106*  BUN 24* 29* 41* 33* 30*  CREATININE 2.05* 2.09* 1.95* 1.68* 1.59*  CALCIUM 8.8* 8.5* 8.8* 9.2 9.6  MG  --   --   --  1.9 2.1   GFR: Estimated  Creatinine Clearance: 70.5 mL/min (A) (by C-G formula based on SCr of 1.59 mg/dL (H)). Liver Function Tests: Recent Labs  Lab 10/15/21 0949 10/15/21 1023 10/20/21 0437  AST 64* 60* 55*  ALT 36 35 69*  ALKPHOS 66 68 68  BILITOT 1.7* 1.6* 1.4*  PROT 7.6 7.2 7.3  ALBUMIN 3.6 3.4* 3.3*   Recent Labs  Lab 10/15/21 0949  LIPASE 78*   Recent Labs  Lab 10/15/21 2002  AMMONIA 32   Coagulation Profile: Recent Labs  Lab 10/15/21 0949 10/15/21 2033  INR 1.5* 1.5*   Cardiac Enzymes: No results for input(s): "CKTOTAL", "CKMB", "CKMBINDEX", "TROPONINI" in the last 168 hours. BNP (last 3 results) No results for input(s): "PROBNP" in the last 8760 hours. HbA1C: No results for input(s): "HGBA1C" in the last 72 hours. CBG: Recent Labs  Lab 10/19/21 1132 10/19/21 1656 10/19/21 2016 10/20/21 0828 10/20/21 1126  GLUCAP 120* 108* 84 89 83   Lipid Profile: No results for input(s): "CHOL", "HDL", "LDLCALC", "TRIG", "CHOLHDL", "LDLDIRECT" in the last 72 hours. Thyroid Function Tests: No results for input(s): "TSH", "T4TOTAL", "FREET4", "T3FREE", "THYROIDAB" in the last 72 hours. Anemia Panel: No results for input(s): "VITAMINB12", "FOLATE", "FERRITIN", "TIBC", "IRON", "RETICCTPCT" in the last 72 hours. Sepsis Labs: Recent Labs  Lab 10/13/21 1629 10/15/21 0946 10/15/21 1702 10/15/21 2002  PROCALCITON  --   --   --  1.22  LATICACIDVEN 1.1 1.6 1.1 0.9    Recent Results (from the past 240 hour(s))  SARS Coronavirus 2 by RT PCR (hospital order, performed in [facility] hospital lab) *cepheid single result test* Urine, Clean Catch     Status: None   Collection Time: 10/13/21  4:29 PM   Specimen: Urine, Clean Catch; Nasal Swab  Result Value Ref Range Status   SARS Coronavirus 2 by RT PCR NEGATIVE NEGATIVE Final    Comment: (NOTE) SARS-CoV-2 target nucleic acids are NOT DETECTED.  The SARS-CoV-2 RNA is generally detectable in upper and lower respiratory specimens during the  acute phase of infection. The lowest concentration of SARS-CoV-2 viral copies this assay can detect is 250 copies / mL. A negative result does not preclude SARS-CoV-2 infection and should not be used as the sole basis for treatment or other patient management decisions.  A negative result may occur with improper specimen collection / handling, submission of specimen other than nasopharyngeal swab, presence of viral mutation(s) within the areas targeted by this assay, and inadequate number of viral copies (<250 copies / mL). A negative result must be combined with clinical observations, patient history, and epidemiological information.  Fact Sheet for Patients:   https://www.patel.info/  Fact Sheet for Healthcare Providers: https://hall.com/  This test is not yet approved or  cleared by the [place] FDA and has been authorized for detection and/or diagnosis of SARS-CoV-2 by FDA under an Emergency Use Authorization (EUA).  This EUA will remain in effect (meaning this test can be used) for the duration of the COVID-19 declaration under Section 564(b)(1) of the Act, 21 U.S.C. section 360bbb-3(b)(1), unless the authorization is terminated or revoked sooner.  Performed at [facility], [address] [place], [place] [address]   Resp Panel by RT-PCR (Flu A&B, Covid) Anterior Nasal Swab     Status: None   Collection Time: 10/15/21 10:23 AM   Specimen:  Anterior Nasal Swab  Result Value Ref Range Status   SARS Coronavirus 2 by RT PCR NEGATIVE NEGATIVE Final    Comment: (NOTE) SARS-CoV-2 target nucleic acids are NOT DETECTED.  The SARS-CoV-2 RNA is generally detectable in upper respiratory specimens during the acute phase of infection. The lowest concentration of SARS-CoV-2 viral copies this assay can detect is 138 copies/mL. A negative result does not preclude SARS-Cov-2 infection and should not be used as the sole basis for  treatment or other patient management decisions. A negative result may occur with  improper specimen collection/handling, submission of specimen other than nasopharyngeal swab, presence of viral mutation(s) within the areas targeted by this assay, and inadequate number of viral copies(<138 copies/mL). A negative result must be combined with clinical observations, patient history, and epidemiological information. The expected result is Negative.  Fact Sheet for Patients:  EntrepreneurPulse.com.au  Fact Sheet for Healthcare Providers:  IncredibleEmployment.be  This test is no t yet approved or cleared by the [place] FDA and  has been authorized for detection and/or diagnosis of SARS-CoV-2 by FDA under an Emergency Use Authorization (EUA). This EUA will remain  in effect (meaning this test can be used) for the duration of the COVID-19 declaration under Section 564(b)(1) of the Act, 21 U.S.C.section 360bbb-3(b)(1), unless the authorization is terminated  or revoked sooner.       Influenza A by PCR NEGATIVE NEGATIVE Final   Influenza B by PCR NEGATIVE NEGATIVE Final    Comment: (NOTE) The Xpert Xpress SARS-CoV-2/FLU/RSV plus assay is intended as an aid in the diagnosis of influenza from Nasopharyngeal swab specimens and should not be used as a sole basis for treatment. Nasal washings and aspirates are unacceptable for Xpert Xpress SARS-CoV-2/FLU/RSV testing.  Fact Sheet for Patients: EntrepreneurPulse.com.au  Fact Sheet for Healthcare Providers: IncredibleEmployment.be  This test is not yet approved or cleared by the [place] FDA and has been authorized for detection and/or diagnosis of SARS-CoV-2 by FDA under an Emergency Use Authorization (EUA). This EUA will remain in effect (meaning this test can be used) for the duration of the COVID-19 declaration under Section 564(b)(1) of the Act, 21  U.S.C. section 360bbb-3(b)(1), unless the authorization is terminated or revoked.  Performed at [facility], [address] [place], [place] [address]   Blood Culture (routine x 2)     Status: None   Collection Time: 10/15/21 10:23 AM   Specimen: BLOOD  Result Value Ref Range Status   Specimen Description BLOOD LEFT ANTECUBITAL  Final   Special Requests   Final    BOTTLES DRAWN AEROBIC AND ANAEROBIC Blood Culture adequate volume   Culture   Final    NO GROWTH 5 DAYS Performed at [facility], [address], [place], [place] [address]    Report Status 10/20/2021 FINAL  Final  Urine Culture     Status: None   Collection Time: 10/15/21 12:21 PM   Specimen: In/Out Cath Urine  Result Value Ref Range Status   Specimen Description   Final    IN/OUT CATH URINE Performed at [facility], [address], [place], [place] [address]    Special Requests   Final    NONE Performed at [facility], [address], [place], [place] [address]    Culture   Final    NO GROWTH Performed at [facility], [place] [address], [place], [place] [address]    Report Status 10/16/2021 FINAL  Final  CSF culture w  Gram Stain     Status: None   Collection Time: 10/15/21  4:57 PM   Specimen: CSF; Cerebrospinal Fluid  Result Value Ref Range Status   Specimen Description   Final    CSF Performed at [facility], [address] [place], [place] [address]    Special Requests   Final    NONE Performed at [facility], [address] [place], [place] [address]    Gram Stain   Final    WBC SEEN RED BLOOD CELLS PRESENT NO ORGANISMS SEEN Performed at [facility], [address], [place], [place] [address]    Culture   Final    NO GROWTH 3 DAYS Performed at [facility], [place] [address] [place], [place] [address]    Report Status 10/19/2021 FINAL  Final  Culture, fungus without smear     Status: None (Preliminary  result)   Collection Time: 10/15/21  4:58 PM   Specimen: CSF; Cerebrospinal Fluid  Result Value Ref Range Status   Specimen Description   Final    CSF Performed at [facility], [address] [place], [place] [address]    Special Requests   Final    NONE Performed at [facility], [address], [place], [place] [address]    Culture   Final    NO FUNGUS ISOLATED AFTER 3 DAYS Performed at [facility], [place] [address] [place], [place] [address]    Report Status PENDING  Incomplete  Culture, blood (Routine X 2) w Reflex to ID Panel     Status: None (Preliminary result)   Collection Time: 10/17/21  6:01 PM   Specimen: BLOOD LEFT ARM  Result Value Ref Range Status   Specimen Description BLOOD LEFT ARM  Final   Special Requests   Final    BOTTLES DRAWN AEROBIC AND ANAEROBIC BACTEROIDES CACCAE   Culture   Final    NO GROWTH 3 DAYS Performed at [facility], [address] [place], [place] [address]    Report Status PENDING  Incomplete  Culture, blood (Routine X 2) w Reflex to ID Panel     Status: None (Preliminary result)   Collection Time: 10/19/21  5:11 AM   Specimen: BLOOD  Result Value Ref Range Status   Specimen Description BLOOD RIGHT [facility]  Final   Special Requests   Final    BOTTLES DRAWN AEROBIC AND ANAEROBIC Blood Culture adequate volume   Culture   Final    NO GROWTH < 24 HOURS Performed at [facility], [address], [place], [place] [address]    Report Status PENDING  Incomplete  Culture, blood (Routine X 2) w Reflex to ID Panel     Status: None (Preliminary result)   Collection Time: 10/19/21  5:11 AM   Specimen: BLOOD  Result Value Ref Range Status   Specimen Description BLOOD RIGHT HAND  Final   Special Requests   Final    BOTTLES DRAWN AEROBIC AND ANAEROBIC Blood Culture results may not be optimal due to an inadequate volume of blood received in culture bottles   Culture   Final    NO GROWTH < 24  HOURS Performed at [facility], [address] [place], [place] [address]    Report Status PENDING  Incomplete         Radiology Studies: EEG adult  Result Date: 2021/11/11 Dustin Havens, MD     November 11, 2021 12:16 PM Patient  Name: Dustin Wells MRN: [id number] Epilepsy Attending: Lora Wells Referring Physician/Provider: Lorenza Chick, MD Date: 10/20/2021 Duration: 21.57 mins  Patient history: 56 year old male with altered mental status.  EEG to evaluate for seizure.  Level of alertness: Awake, asleep  AEDs during EEG study: None  Technical aspects: This EEG study was done with scalp electrodes positioned according to the 10-20 International system of electrode placement. Electrical activity was reviewed with band pass filter of 1-70Hz , sensitivity of 7 uV/mm, display speed of 49m/sec with a 60Hz  notched filter applied as appropriate. EEG data were recorded continuously and digitally stored.  Video monitoring was available and reviewed as appropriate.  Description: The posterior dominant rhythm consists of 9-10 Hz activity of moderate voltage (25-35 uV) seen predominantly in posterior head regions, symmetric and reactive to eye opening and eye closing.  Sleep was characterized by sleep spindles (12 to 14 Hz), maximal frontocentral region.  Periodic discharges with overriding fast activity were noted in right parieto-occipital region at 1hz  which at times appeared rhythmic lasting 3-9 seconds consistent with brief-ictal-interictal rhythmic discharges. Five electrographic seizures were also noted during which EEG showed evolution in morphology, frequency and involved vertex region. No clinical signs were noted. Average duration 10-15 seconds.  Last seizure was at 1102. Hyperventilation and photic stimulation were not performed.    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 There is also evidence of epileptogenicity with increased risk of seizure recurrence.   Dr. BCurly Shoreswas notified. Dustin Wells  EEG adult  Result Date: 10/19/2021 YLora Havens MD     10/19/2021  4:36 PM Patient Name: Dustin BalaMRN: [id number] Attending: PLora HavensReferring Physician/Provider: BLorenza Chick MD Date: 10/19/2021 Duration: 29.57 mins Patient history: 56year old male with altered mental status.  EEG to evaluate for seizure. Level of alertness: Awake AEDs during EEG study: None Technical aspects: This EEG study was done with scalp electrodes positioned according to the 10-20 International system of electrode placement. Electrical activity was reviewed with band pass filter of 1-70Hz , sensitivity of 7 uV/mm, display speed of 378msec with a 60Hz  notched filter applied as appropriate. EEG data were recorded continuously and digitally stored.  Video monitoring was available and reviewed as appropriate. Description: The posterior dominant rhythm consists of 9-10 Hz activity of moderate voltage (25-35 uV) seen predominantly in posterior head regions, symmetric and reactive to eye opening and eye closing. Periodic discharges with overriding fast activity were noted in right parieto-occipital region at 1hz  which at times appeared rhythmic lasting 2-5 seconds consistent with brief-ictal-interictal rhythmic discharges. Six electrographic seizures were also noted during which EEG showed evolution in morphology, frequency and involved vertex region. No clinical signs were noted. Average duration 30-45 seconds.  Last seizure was at 1546. Hyperventilation and photic stimulation were not performed.   ABNORMALITY - Electrographic seizure, right parieto-occipital region -  Brief-ictal-interictal rhythmic discharges, right parieto-occipital region - Periodic discharges with overriding fast activity, right parieto-occipital regio IMPRESSION: This study showed six electrographic seizures arising from right parieto-occipital region, lasting about 30-45 seconds each. There is also evidence of epileptogenicity with increased risk of seizure recurrence. Consider long term eeg monitoring. Dr. BhCurly Shoresas notified. Dustin Wells  Scheduled Meds:  allopurinol  300 mg Oral Daily   atorvastatin  40 mg Oral Daily   cholecalciferol  1,000 Units Oral Daily   cyanocobalamin  1,000 mcg Oral Daily   flecainide  100 mg Oral V14Y   folic acid  1 mg Oral Daily   insulin aspart  0-5 Units Subcutaneous QHS   insulin aspart  0-9 Units Subcutaneous TID WC   lacosamide  100 mg Oral BID   multivitamin with minerals  1 tablet Oral Daily   pantoprazole  40 mg Oral Daily   sodium bicarbonate  650 mg Oral BID   thiamine  100 mg Oral Daily   Or   thiamine  100 mg Intravenous Daily   Continuous Infusions:  ganciclovir 290 mg (10/20/21 1230)   ganciclovir     heparin 1,500 Units/hr (10/20/21 0926)   levETIRAcetam       LOS: 5 days    Time spent: 50 mins    Joon Pohle, MD [facility]   If 7PM-7AM, please contact night-coverage

## 2021-10-20 NOTE — Progress Notes (Signed)
Physical Therapy Treatment Patient Details Name: Dustin Wells MRN: [id number] DOB: 05-13-1965 Today's Date: 10/20/2021   History of Present Illness Pt is a 56 y/o M admitted on 10/15/21 after presenting with c/o AMS, SOB, abdominal pain & distension & fever. Pt was recently hospitalized from 7/26-7/27 2/2 syncope & MRI showed chronic R MCA infarction & syncope was thought to be due to hypovolemia. 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 PMH: HTN, HLD, prediabetes, GERD, gout, depression with anxiety, testicular CA, dCHF, a-fib on eliquis, CKD3B, alcohol abuse, OSA on CPAP, obesity, syncope, thrombocytopenia    PT Comments    Pt received upright in bed agreeable to PT services. Pt transferring to EoB with supervision and standing to RW with minguard and safe hand placement. Initial posterior lean requiring minA to minguard to correct with VC's for anterior weight shift with good carryover. Initial reports of mild dizziness that dissipates after ~30 seconds of standing at RW. Pt progressing gait to 200' with RW with close supervision. Mild unsteadiness noted with variable step lengths but able to correct independently. Slow, but consistent cadence throughout with limited step through gait appreciated. Max HR up to 97 BPM with pt endorsing LE fatigue. Pt returning to recliner performing LE therex. Education provided on encouraging pt to ambulate with nursing for functional strengthening and return to independence with ADL's. All needs in reach, D/c recs remain appropriate at this time.    Recommendations for follow up therapy are one component of a multi-disciplinary discharge planning process, led by the attending physician.  Recommendations may be updated based on patient status, additional functional criteria and insurance authorization.  Follow Up Recommendations  Home health PT     Assistance Recommended at Discharge Frequent or constant  Supervision/Assistance  Patient can return home with the following A little help with walking and/or transfers;A little help with bathing/dressing/bathroom;Assistance with cooking/housework;Assist for transportation;Help with stairs or ramp for entrance;Direct supervision/assist for medications management   Equipment Recommendations  Rolling walker (2 wheels)    Recommendations for Other Services       Precautions / Restrictions Precautions Precautions: Fall Restrictions Weight Bearing Restrictions: No     Mobility  Bed Mobility Overal bed mobility: Needs Assistance Bed Mobility: Supine to Sit     Supine to sit: Supervision, HOB elevated       Patient Response: Cooperative, Flat affect  Transfers Overall transfer level: Needs assistance Equipment used: Rolling walker (2 wheels) Transfers: Sit to/from Stand Sit to Stand: Min guard           General transfer comment: safe hand placement    Ambulation/Gait Ambulation/Gait assistance: Supervision Gait Distance (Feet): 200 Feet Assistive device: Rolling walker (2 wheels) Gait Pattern/deviations: Decreased step length - right, Decreased step length - left, Decreased dorsiflexion - right, Decreased dorsiflexion - left, Decreased stride length, Narrow base of support       General Gait Details: limited step through gait with very short, reciprocal steps/. Cautious and slow gait throughout.   Stairs             Wheelchair Mobility    Modified Rankin (Stroke Patients Only)       Balance Overall balance assessment: Needs assistance Sitting-balance support: Feet supported, No upper extremity supported Sitting balance-Leahy Scale: Fair   Postural control: Posterior lean Standing balance support: During functional activity, No upper extremity supported, Reliant on assistive device for balance Standing balance-Leahy Scale: Fair Standing balance comment: Initial posterior bias in standing  requiring VC's to  correct. Once corrected, fair standing balance with RW.                            Cognition Arousal/Alertness: Awake/alert Behavior During Therapy: WFL for tasks assessed/performed Overall Cognitive Status: History of cognitive impairments - at baseline                                          Exercises General Exercises - Lower Extremity Long Arc Quad: AROM, Strengthening, Both, 10 reps, Seated Hip Flexion/Marching: AROM, Strengthening, Both, 10 reps, Seated Toe Raises: AROM, Strengthening, Both, 10 reps, Seated Heel Raises: AROM, Strengthening, Seated, Both, 10 reps Other Exercises Other Exercises: Benefits of OOB mobility with nursing for toileting to return to independence and for functional strengthening    General Comments General comments (skin integrity, edema, etc.): Max HR up to 97 BPM with mobility      Pertinent Vitals/Pain Pain Assessment Pain Assessment: Faces Faces Pain Scale: Hurts a little bit Pain Location: low back Pain Descriptors / Indicators: Discomfort, Dull Pain Intervention(s): Repositioned    Home Living                          Prior Function            PT Goals (current goals can now be found in the care plan section) Acute Rehab PT Goals Patient Stated Goal: get better PT Goal Formulation: With patient/family Time For Goal Achievement: 10/31/21 Potential to Achieve Goals: Good Progress towards PT goals: Progressing toward goals    Frequency    Min 2X/week      PT Plan Current plan remains appropriate    Co-evaluation              AM-PAC PT "6 Clicks" Mobility   Outcome Measure  Help needed turning from your back to your side while in a flat bed without using bedrails?: None Help needed moving from lying on your back to sitting on the side of a flat bed without using bedrails?: A Little Help needed moving to and from a bed to a chair (including a wheelchair)?: A Little Help needed  standing up from a chair using your arms (e.g., wheelchair or bedside chair)?: A Little Help needed to walk in hospital room?: A Little Help needed climbing 3-5 steps with a railing? : A Little 6 Click Score: 19    End of Session Equipment Utilized During Treatment: Gait belt Activity Tolerance: Patient tolerated treatment well Patient left: in chair;with chair alarm set;with call bell/phone within reach Nurse Communication: Mobility status PT Visit Diagnosis: Unsteadiness on feet (R26.81);Muscle weakness (generalized) (M62.81);Difficulty in walking, not elsewhere classified (R26.2)     Time: 4098-1191 PT Time Calculation (min) (ACUTE ONLY): 19 min  Charges:  $Therapeutic Exercise: 8-22 mins                     Salem Caster. Fairly IV, PT, DPT Physical Therapist- [facility]  10/20/2021, 10:22 AM

## 2021-10-20 NOTE — Assessment & Plan Note (Signed)
Resolved.  Patient is now on room air. Etiology is not clear.  May be secondary to sepsis and encephalitis.  Chest x-ray negative for effusion.  COVID PCR negative.  Patient is taking Eliquis, less likely to have PE.  Patient has history of diastolic CHF, but due to obesity, it is very difficult to assess volume status.  BNP at 256.  Patient did received IV fluid per sepsis protocol. He was given some IV Lasix for 1 day-appears euvolemic  -Bronchodilators

## 2021-10-20 NOTE — Progress Notes (Signed)
Report given to Ty RN on [facility].    Jakyrah Holladay, Tivis Ringer, RN

## 2021-10-20 NOTE — Procedures (Addendum)
Patient Name: Dustin Wells  MRN: [id number]  Epilepsy Attending: Lora Havens  Referring Physician/Provider: Lorenza Chick, MD  Date: 10/20/2021 Duration: 21.57 mins   Patient history: 56 year old male with altered mental status.  EEG to evaluate for seizure.   Level of alertness: Awake, asleep   AEDs during EEG study: LEV   Technical aspects: This EEG study was done with scalp electrodes positioned according to the 10-20 International system of electrode placement. Electrical activity was reviewed with band pass filter of 1-'70Hz'$ , sensitivity of 7 uV/mm, display speed of 54m/sec with a '60Hz'$  notched filter applied as appropriate. EEG data were recorded continuously and digitally stored.  Video monitoring was available and reviewed as appropriate.   Description: The posterior dominant rhythm consists of 9-10 Hz activity of moderate voltage (25-35 uV) seen predominantly in posterior head regions, symmetric and reactive to eye opening and eye closing.  Sleep was characterized by sleep spindles (12 to 14 Hz), maximal frontocentral region.  Periodic discharges with overriding fast activity were noted in right parieto-occipital region at '1hz'$  which at times appeared rhythmic lasting 3-9 seconds consistent with brief-ictal-interictal rhythmic discharges. Five electrographic seizures were also noted during which EEG showed evolution in morphology, frequency and involved vertex region. No clinical signs were noted. Average duration 10-15 seconds.  Last seizure was at 1102. Hyperventilation and photic stimulation were not performed.      ABNORMALITY - Electrographic seizure, right parieto-occipital region - Brief-ictal-interictal rhythmic discharges, right parieto-occipital region - Periodic discharges with overriding fast activity, right parieto-occipital regio   IMPRESSION: This study showed five electrographic seizures arising from right parieto-occipital region, lasting about 10-15 seconds each.  There is also evidence of epileptogenicity with increased risk of seizure recurrence.     Dr. BCurly Shoreswas notified.  Delitha Elms OBarbra Sarks

## 2021-10-20 NOTE — Progress Notes (Signed)
ANTICOAGULATION CONSULT NOTE - Follow up  Pharmacy Consult for heparin infusion Indication: atrial fibrillation  Allergies  Allergen Reactions   Rivaroxaban Diarrhea, Nausea Only and Other (See Comments)    Nausea, diarrhea, abdominal pain Nausea, diarrhea, abdominal pain Nausea, diarrhea, abdominal pain    Sulfamethoxazole-Trimethoprim Rash    Patient Measurements: Weight: 115.6 kg (254 lb 12.8 oz) IBW 77.6 kg Heparin Dosing Weight: 102.6kg  Vital Signs: Temp: 97.4 F (36.3 C) (08/02 0741) Temp Source: Oral (08/02 0741) BP: 131/79 (08/02 0741) Pulse Rate: 78 (08/02 0741)  Labs: Recent Labs    10/17/21 1020 10/19/21 0511 10/19/21 1634 10/19/21 1748 10/19/21 2339 10/20/21 0437 10/20/21 0724  HGB  --   --  13.0  --   --  13.6  --   HCT  --   --  39.5  --   --  41.1  --   PLT  --   --  229  --   --  261  --   APTT  --   --   --  34 115*  --  61*  HEPARINUNFRC  --   --   --  >1.10*  --   --  >1.10*  CREATININE 1.95* 1.68*  --   --   --  1.59*  --     Estimated Creatinine Clearance: 70.5 mL/min (A) (by C-G formula based on SCr of 1.59 mg/dL (H)).  Medical History: Past Medical History:  Diagnosis Date   Atrial fibrillation (HCC)    CHF (congestive heart failure) ([place])    Hypertension    Renal disorder    Stroke [facility])    Testicular cancer (HCC)     Medications:  PTA: Eliquis '5mg'$  BID (last dose 8/1 at 9 AM) Inpatient: 7/28 Eliquis > 8/1 > Heparin infusion >  Allergies: Pat reports xarelto causing Nausea, diarrhea, abdominal pain  Assessment: 56 year old male  with EE concerning for subclinical seizures. Pharmacy consulted to transition apixaban to heparin drip in case repeat lumbar puncture to confirm no HSV is needed  Goal of Therapy:  Heparin level 0.3-0.7 units/ml aPTT 66-102 seconds Monitor platelets by anticoagulation protocol: Yes  Date Time aPTT/HL Rate/Comment 8/1 2339 aPTT 115 Held x1 hr, then resume at 1400un/hr 8/1 0724 aPTT 61 Slightly  subtherapeutic @ 1400 un/hr  Plan:  Dosing weight CORRECTED aPTT slightly subtherapeutic , will increase heparin rate to 1500 units/hr Repeat aPTT in 6 hrs Daily CBC to monitor hgb and plt    Franki Alcaide Rodriguez-Guzman PharmD, BCPS 10/20/2021 8:19 AM

## 2021-10-20 NOTE — Progress Notes (Signed)
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 meningitis /encephalitis panel positive for only HHV6,  Acyclovir,vanco, amp Dc Ganciclovir started  Ceftaroline continued Pt was alert and oriented by next day  Bacterial meningitis or encephalitis seemed unlikely HHV6 in csf was unusual as he was not immunecompromised currently HHV6 quantitative PCR was sent both in csf and blood an ganciclovir continued and also ceftaroline was changed to meropenem D.D  Viral infections- , WNV remotely possible- will see whether we have enough csf to test HHV6 came positive-  Await csf HHV6 PCR Currently on ganciclovir- continue till we get HHV^ Quantitative PCR back Woill also chesk VZV IgG in csf   R/o paraneoplastic Autoimmune Malignant Seizures Normal pressure hydrocephalus   Neuro saw the patient today and I discussed with her She is mainly Concerned about seizure - etiology of seizure multifactorial- alcohol  withdrawal, with previous cva and irritable focus and possible UTI ( see her comprehensive and very helpful  note)   Eventhough patient had perinephric stranding around both kidneys, UA had no WBC , urine culture normal. There was blood in the urine and it is possible that he could have passed stones Anyway he has been given 4 days of IV antibiotics which is likely sufficient if he had an infection  As wbc from 8/1 normal and neg blood, csf, urine culture no antibiotics needed- meropenem dc after 3 doses  Seizure -  Subclinical with continuous EEG activity  on keppra, vimpat  H/o cva - rt stable encephalomalacia of the posterior /temporal and parietal lobe which is now the nidus for sz activity Being transferred [facility] for [facility] continuous monitoring Discussed the management with patient, wife and care team

## 2021-10-20 NOTE — Assessment & Plan Note (Addendum)
Chronic due to EtOH abuse. Resolved at this time.

## 2021-10-20 NOTE — Assessment & Plan Note (Signed)
Cozaar and amlodipine on hold

## 2021-10-20 NOTE — Assessment & Plan Note (Signed)
2D echo on 10/14/2021 showed EF 55-60%. Clinically appears euvolemic.  Mild worsening of azotemia, creatinine seems improving.  Received some IV Lasix. -Continue to monitor.

## 2021-10-20 NOTE — Consult Note (Signed)
ANTICOAGULATION CONSULT NOTE - Initial Consult  Pharmacy Consult for heparin infusion Indication: atrial fibrillation  Allergies  Allergen Reactions   Rivaroxaban Diarrhea, Nausea Only and Other (See Comments)    Nausea, diarrhea, abdominal pain Nausea, diarrhea, abdominal pain Nausea, diarrhea, abdominal pain    Sulfamethoxazole-Trimethoprim Rash    Patient Measurements: Weight: 115.6 kg (254 lb 12.8 oz) Heparin Dosing Weight: 115.6kg  Vital Signs: Temp: 98.2 F (36.8 C) (08/01 2329) Temp Source: Oral (08/01 1655) BP: 133/89 (08/01 2329) Pulse Rate: 74 (08/01 2329)  Labs: Recent Labs    10/17/21 1020 10/19/21 0511 10/19/21 1634 10/19/21 1748 10/19/21 2339  HGB  --   --  13.0  --   --   HCT  --   --  39.5  --   --   PLT  --   --  229  --   --   APTT  --   --   --  34 115*  HEPARINUNFRC  --   --   --  >1.10*  --   CREATININE 1.95* 1.68*  --   --   --      Estimated Creatinine Clearance: 66.7 mL/min (A) (by C-G formula based on SCr of 1.68 mg/dL (H)).   Medical History: Past Medical History:  Diagnosis Date   Atrial fibrillation (HCC)    CHF (congestive heart failure) ([place])    Hypertension    Renal disorder    Stroke [facility])    Testicular cancer (HCC)     Medications:  PTA: Eliquis '5mg'$  BID (last dose 8/1 at 9 AM) Inpatient: 7/28 Eliquis > 8/1 > Heparin infusion > Allergies: Pat reports xarelto causing Nausea, diarrhea, abdominal pain  Assessment: 56 year old male  with EE concerning for subclinical seizures. Pharmacy consulted to transition apixaban to heparin drip in case repeat lumbar puncture to confirm no HSV is needed  Goal of Therapy:  Heparin level 0.3-0.7 units/ml aPTT 66-102 seconds Monitor platelets by anticoagulation protocol: Yes  Date Time aPTT/HL Rate/Comment  Plan:  8/1 @ 2339:  aPTT = 115 , elevated Will hold heparin drip for 1 hr and restart heparin gtt @ 0130 at 1400 units/hr.  Will recheck HL and aPTT 6 hrs after restart.  -   Prior Eliquis order not Wells/c'Wells before starting heparin gtt ,  heparin gtt started on 8/1 @ 1933 and Eliquis 5 mg given @ 2212.  Will Wells/c Eliquis.           Dustin Wells,Dustin Wells 10/20/2021,12:48 AM

## 2021-10-20 NOTE — Assessment & Plan Note (Signed)
Con't lipitor  

## 2021-10-20 NOTE — Plan of Care (Signed)
  Problem: Education: Goal: Ability to describe self-care measures that may prevent or decrease complications (Diabetes Survival Skills Education) will improve Outcome: Progressing   

## 2021-10-20 NOTE — Assessment & Plan Note (Addendum)
Focal seizures Neuro on board 1. Cont EEG tonight 2. Cont keppra + Vimpat per neuro

## 2021-10-20 NOTE — Progress Notes (Addendum)
Neurology Progress Note  Patient ID: Dustin Wells is a 56 y.o. 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  Subjective: - Reports one hand spasms of the left hand while in EEG, but otherwise no correlate to the 6 separate electrographic seizures he had  - No other acute complaints this morning - Tolerating Keppra well without any significant side effects  Exam: Vitals:   10/19/21 2329 10/20/21 0741  BP: 133/89 131/79  Pulse: 74 78  Resp: 20 16  Temp: 98.2 F (36.8 C) (!) 97.4 F (36.3 C)  SpO2: 93% 96%   Gen: In chair, comfortable  Resp: non-labored breathing, no grossly audible wheezing Cardiac: Perfusing extremities well  Abd: soft, nt  Neuro: MS: Awake, alert, oriented to person, place, situation, knows that he is pending transfer to [facility] and that he is having frequent subclinical seizures and can recall that he had CSF findings with extremely elevated cell counts.  He does have some motor apraxia with difficulty copying complex finger positions, continues to have some left-sided neglect actually somewhat worse than yesterday CN: Pupils equal round and reactive to light, visual fields full to confrontation but with intermittent extinction of the left-sided stimuli, EOMI, face symmetric to sensation and movement, tongue midline Motor: More pronation and drift of the left upper extremity today Sensory: Reports slightly increased sensation on the right upper extremity compared to left upper extremity today  Pertinent Labs:  Basic Metabolic Panel: Recent Labs  Lab 10/13/21 1026 10/14/21 0652 10/15/21 1023 10/16/21 0723 10/17/21 1020 10/19/21 0511 10/20/21 0437  NA 136   < > 136 137 134* 138 139  K 3.8   < > 3.9 3.7 3.4* 4.0 4.3  CL 99   < > 105 102 97* 105 108  CO2 24   < > 22 21* 25 26  21*  GLUCOSE 138*   < > 134* 181* 195* 92 106*  BUN 47*   < > 24* 29* 41* 33* 30*  CREATININE 4.17*   < > 2.05* 2.09* 1.95* 1.68* 1.59*  CALCIUM 9.2   < > 8.8* 8.5* 8.8* 9.2 9.6  MG 1.9  --   --   --   --  1.9  --    < > = values in this interval not displayed.    CBC: Recent Labs  Lab 10/13/21 1026 10/14/21 0652 10/15/21 0949 10/15/21 1023 10/16/21 0723 10/19/21 1634 10/20/21 0437  WBC 5.9   < > 8.7 8.6 10.9* 6.9 7.1  NEUTROABS 4.4  --  6.2 7.0  --  4.3  --   HGB 14.5   < > 13.5 13.1 12.8* 13.0 13.6  HCT 43.7   < > 40.2 37.8* 36.9* 39.5 41.1  MCV 93.8   < > 91.4 90.2 91.3 92.7 93.6  PLT 101*   < > 128* 107* 129* 229 261   < > = values in this interval not displayed.    Coagulation Studies: No results for input(s): "LABPROT", "INR" in the last 72 hours.   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  If patient is still awaiting transfer at the  time of my EEG techs arrival today, she will repeat EEG at this morning.  Unfortunately due to his significant comorbidities there will be significant interactions with many antiseizure medications.  For example phenobarbital which can be very good in controlling seizures and alcoholics it would be contraindicated due to reducing efficacy of apixaban, this phenytoin would be similarly contraindicated.  Vimpat can cause significant heart block in patients taking flecainide and metoprolol particularly although at this time his EKG is reassuring.  Given his hepatic injury from alcoholism, Depakote is not an ideal option, especially if he resumes drinking on discharge.  Unfortunately his exam does actually seem a little bit worse to me today, with slightly increased left-sided neglect  and left pronator drift, concerning for ongoing subclinical seizure activity  Differential diagnosis for etiology of his seizures continues to include postop epilepsy, alcohol withdrawal lowering his seizure threshold, versus atypical CNS infection given [employer] panel only positive for HHV-6.  I continue to feel that HHV-6 is unlikely to be the etiology given he is immunocompetent and given his rapid recovery (with 1% of the general population showing chromosomal integration of HHV-6), but appreciate ID following along and pending CSF HHV-6 viral levels  Impression: -Unexplained extremely high CSF neutrophilic pleocytosis, continue to consider herpes virus family infections given improvement with acyclovir/ganciclovir -Focal status epilepticus with retained awareness and subclinical seizures -Alcoholism -Atrial fibrillation on apixaban, flecainide and metoprolol -History of seminoma -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 with anticoagulation paused for procedure   Recommendations: - Continue to await transfer to [facility] - Repeat EEG here if patient has not been transferred - Repeat EKG to confirm no significant heart block and we will consider adding Vimpat versus Depakote as a second agent which he will likely need given his seizure frequency - Discussed with laboratory, will attempt to add HSV IgG and VZV IgG CSF for increased sensitivity of detecting a PCR negative infection if CSF is available; no CSF is available in-house but they are consulting with [facility] to see if they have any sample left over - In case CSF is not available, continue heparin drip at this time for anticoagulation rather than apixaban as he may need a repeat lumbar puncture in the future.  Last dose of apixaban was 8/1 at 2200, so earliest LP would be 8/6 to reduce bleeding risk - Serum antiparaneoplastic antibody panel ([facility] Test ID ENS2); consider CSF panel ENC2 if patient is  tapped again - Continue seizure precautions - Spent a significant amount of time again counseling patient on importance of ethanol cessation.  He is very interested in quitting - Appreciate substance abuse consult or psychiatry consult if available inpatient - Continue Keppra 1000 mg BID for now, may be able to increase to 1500 twice daily if his renal function continues to improve Estimated Creatinine Clearance: 70.5 mL/min (A) (by C-G formula based on SCr of 1.59 mg/dL (H)).   CrCl 80 to 130 mL/minute/1.73 m2: 500 mg to 1.5 g every 12 hours.  CrCl 50 to <80 mL/minute/1.73 m2: 500 mg to 1 g every 12 hours.  CrCl 30 to <50 mL/minute/1.73 m2: 250 to 750 mg every 12 hours.  CrCl 15 to <30 mL/minute/1.73 m2: 250 to 500 mg every 12 hours.  CrCl <15 mL/minute/1.73 m2: 250 to 500 mg every 24 hours (expert opinion). - Appreciate infectious disease following along - Appreciate primary team management of comorbidities, neurology will continue to follow here or at [facility]  Lesleigh Noe MD-PhD [facility] [phone]

## 2021-10-20 NOTE — Assessment & Plan Note (Signed)
?   Polyp on CT Perinephric stranding on CT 1. Needs colonoscopy as outpt. 2. Had 4 days of broad spectrum ABx, should have been adequate to cover UTI if he had

## 2021-10-20 NOTE — Assessment & Plan Note (Signed)
Continue ozempic.   

## 2021-10-20 NOTE — Assessment & Plan Note (Addendum)
Cont eliquis Cont flecainide

## 2021-10-20 NOTE — Assessment & Plan Note (Addendum)
Met sepsis criteria earlier during stay. Presumably due to CNS source (see encephalitis above). No further fevers during stay Off ABx at the moment, just on gaincyclovir for HHV-6 CNS infection. 1. ID re-consult in AM. 2. If re-develops sepsis symptoms or AMS overnight, restart ABx

## 2021-10-20 NOTE — Progress Notes (Addendum)
No charge same-day note  EEG continues to show frequent seizure activity  EKG demonstrates stable PR interval, no evidence of heart block  Adding Vimpat 200 mg oral loading dose (avoiding IV given patient is also on flecainide to reduce risk of arrhythmia) Continue Vimpat 100 mg twice daily Increasing Keppra to 1500 mg twice daily given improving renal function and continued frequent seizures Please continue cardiac monitoring (already in place) Appreciate primary team continuing to facilitate transfer Determining if repeat EEG later this evening may be completed if no beds continue to be available   2:54 PM addendum  Oral Vimpat dose should be peaking around now (typical time to peak 1 to 4 hours), given patient is still pending transfer to [facility], will repeat routine EEG to see if he is responding at all to the Vimpat  If still having frequent seizures will add onfi tonight, given its GABAergic mechanism he may respond to this well due to his alcohol use history

## 2021-10-21 DIAGNOSIS — F1020 Alcohol dependence, uncomplicated: Secondary | ICD-10-CM | POA: Diagnosis not present

## 2021-10-21 DIAGNOSIS — R140 Abdominal distension (gaseous): Secondary | ICD-10-CM

## 2021-10-21 DIAGNOSIS — J9601 Acute respiratory failure with hypoxia: Secondary | ICD-10-CM | POA: Diagnosis not present

## 2021-10-21 DIAGNOSIS — R569 Unspecified convulsions: Secondary | ICD-10-CM | POA: Diagnosis not present

## 2021-10-21 DIAGNOSIS — G0490 Encephalitis and encephalomyelitis, unspecified: Secondary | ICD-10-CM | POA: Diagnosis not present

## 2021-10-21 LAB — BASIC METABOLIC PANEL
Anion gap: 7 (ref 5–15)
BUN: 20 mg/dL (ref 6–20)
CO2: 22 mmol/L (ref 22–32)
Calcium: 9.5 mg/dL (ref 8.9–10.3)
Chloride: 108 mmol/L (ref 98–111)
Creatinine, Ser: 1.43 mg/dL — ABNORMAL HIGH (ref 0.61–1.24)
GFR, Estimated: 59 mL/min — ABNORMAL LOW (ref >60)
Glucose, Bld: 93 mg/dL (ref 70–99)
Potassium: 4.5 mmol/L (ref 3.5–5.1)
Sodium: 137 mmol/L (ref 135–145)

## 2021-10-21 LAB — CBC
HCT: 37.2 % — ABNORMAL LOW (ref 39.0–52.0)
Hemoglobin: 12.5 g/dL — ABNORMAL LOW (ref 13.0–17.0)
MCH: 31.6 pg (ref 26.0–34.0)
MCHC: 33.6 g/dL (ref 30.0–36.0)
MCV: 93.9 fL (ref 80.0–100.0)
Platelets: 237 10*3/uL (ref 150–400)
RBC: 3.96 MIL/uL — ABNORMAL LOW (ref 4.22–5.81)
RDW: 14.1 % (ref 11.5–15.5)
WBC: 7.7 10*3/uL (ref 4.0–10.5)
nRBC: 0 % (ref 0.0–0.2)

## 2021-10-21 MED ORDER — SODIUM CHLORIDE 0.9 % IV SOLN
750.0000 mg | Freq: Once | INTRAVENOUS | Status: AC
  Administered 2021-10-21: 750 mg via INTRAVENOUS
  Filled 2021-10-21: qty 5.77

## 2021-10-21 MED ORDER — GANCICLOVIR SODIUM 500 MG IV SOLR
5.0000 mg/kg | Freq: Two times a day (BID) | INTRAVENOUS | Status: DC
Start: 2021-10-21 — End: 2021-10-25
  Administered 2021-10-21 – 2021-10-25 (×9): 465 mg via INTRAVENOUS
  Filled 2021-10-21 (×12): qty 9.3

## 2021-10-21 MED ORDER — LEVETIRACETAM 750 MG PO TABS
1500.0000 mg | ORAL_TABLET | Freq: Two times a day (BID) | ORAL | Status: DC
  Administered 2021-10-21 – 2021-10-25 (×8): 1500 mg via ORAL
  Filled 2021-10-21 (×8): qty 2

## 2021-10-21 MED ORDER — LACOSAMIDE 50 MG PO TABS
100.0000 mg | ORAL_TABLET | Freq: Two times a day (BID) | ORAL | Status: DC
  Administered 2021-10-21 – 2021-10-25 (×8): 100 mg via ORAL
  Filled 2021-10-21 (×8): qty 2

## 2021-10-21 NOTE — Progress Notes (Signed)
Per VAS, ganciclovir admin by floor RN - admin by floor RN's continue - charge RN aware.

## 2021-10-21 NOTE — Progress Notes (Signed)
Subjective: No further seizures overnight.  However continues to have prolonged ictal-interictal rhythmic discharges.  Wife and daughter at bedside.  ROS: negative except above  Examination  Vital signs in last 24 hours: Temp:  [97.5 F (36.4 C)-98.9 F (37.2 C)] 98.7 F (37.1 C) (08/03 0820) Pulse Rate:  [70-82] 70 (08/03 0820) Resp:  [14-16] 16 (08/03 0424) BP: (112-155)/(72-97) 155/97 (08/03 0820) SpO2:  [92 %-100 %] 97 % (08/03 0820)  General: lying in bed, NAD Neuro: MS: Alert, oriented, follows commands CN: pupils equal and reactive,  EOMI, face symmetric, tongue midline, normal sensation over face, Motor: 5/5 strength in all 4 extremities except 5-/5 in LUE  Basic Metabolic Panel: Recent Labs  Lab 10/16/21 0723 10/17/21 1020 10/19/21 0511 10/20/21 0437 10/21/21 0602  NA 137 134* 138 139 137  K 3.7 3.4* 4.0 4.3 4.5  CL 102 97* 105 108 108  CO2 21* 25 26 21* 22  GLUCOSE 181* 195* 92 106* 93  BUN 29* 41* 33* 30* 20  CREATININE 2.09* 1.95* 1.68* 1.59* 1.43*  CALCIUM 8.5* 8.8* 9.2 9.6 9.5  MG  --   --  1.9 2.1  --     CBC: Recent Labs  Lab 10/15/21 0949 10/15/21 1023 10/16/21 0723 10/19/21 1634 10/20/21 0437 10/21/21 0602  WBC 8.7 8.6 10.9* 6.9 7.1 7.7  NEUTROABS 6.2 7.0  --  4.3  --   --   HGB 13.5 13.1 12.8* 13.0 13.6 12.5*  HCT 40.2 37.8* 36.9* 39.5 41.1 37.2*  MCV 91.4 90.2 91.3 92.7 93.6 93.9  PLT 128* 107* 129* 229 261 237     Coagulation Studies: No results for input(s): "LABPROT", "INR" in the last 72 hours.  Imaging  MRI brain with and without contrast 10/16/2021: 1. No acute intracranial abnormality or significant interval change. 2. Stable encephalomalacia of the right posterior temporal and parietal lobe. 3. Scattered subcortical T2 hyperintensities bilaterally are mildly advanced for age. The finding is nonspecific but can be seen in the setting of chronic microvascular ischemia, a demyelinating process such as multiple sclerosis,  vasculitis, complicated migraine headaches, or as the sequelae of a prior infectious or inflammatory process.  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  No definite seizures were seen during the study.  Recommendations -IV phenobarbital 500 mg once.  We will start maintenance if needed tomorrow -Continue Keppra 1500 mg twice daily and Vimpat 100 mg twice daily, will switch to p.o. -Continue LTM EEG to look for intermittent seizures -Continue seizure precautions -As needed IV Ativan 2 mg for clinical seizure-like activity -Management of rest of comorbidities per primary team -Discussed plan with patient, wife and daughter at bedside as well as medicine team  I have spent a total of 48   minutes with the patient reviewing hospital notes,  test results, labs and examining the patient as well as establishing an assessment and plan that was discussed personally with the patient.  > 50% of time was spent in direct patient care.    Zeb Comfort Epilepsy [facility] For questions after 5pm please refer to AMION to reach the Neurologist on call

## 2021-10-21 NOTE — Consult Note (Signed)
[facility]  Total days of antibiotics 7/day 6 of ganciclovir               Reason for Consult: new onset seizures/meningitis   Referring Physician: grunz  Principal Problem:   Encephalitis Active Problems:   Alcohol use disorder, severe, dependence ([place])   Stage 3b chronic kidney disease (CKD) (HCC)   Atrial fibrillation, chronic (HCC)   Sepsis ([place])   Thrombocytopenia (HCC)   HTN (hypertension)   HLD (hyperlipidemia)   Obesity with body mass index of 30.0-39.9   Testicular cancer (HCC)   Chronic diastolic CHF (congestive heart failure) (HCC)   Abdominal distention   Acute respiratory failure with hypoxia (HCC)   Seizures (HCC)    HPI: Dustin Wells is a 56 y.o. 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 Also, he was most recently started on clomid for hypogonadism by dr brandon on 09/29/21.   He went to the ED on 7/26 for syncopal event at work that was in concert with increasing back and neck pain in addition to feeling light headed. He was admitted for 24hrs where work including head ct and mri of brain was negative for new insult. (Hx of chronic right MCA infarct). He returned to the ED on 7/28 now with confusion, fever of 100.59F, some abdominal pain. Due to AMS, he underwent Lumbar Puncture which showed WBC of 804, 75%N, 19% Lymph. TP 103, glu 54. CSF PCR + HHV-6. CSF cx NGTD. 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 His infectious work up also included blood cx and urine cxwhich were negative. He did have abd/pelvic CT that showed perinephric stranding concerning for pyelonephritis. There is question to whether CSF PCR is falsely  positive(remote reactivation of HHV-6). Patient has hx of heavy alcohol use but did not drink as much in the previous week. He was noted to having new onset seizure for which he was started on anti-epileptics and transferred to [facility] for continuous monitoring and further management of seizures and work up.  It is unclear if possible increase dose in ozempic may have increased risk for seizure in setting of alcohol use or his prior CVA.  HHV-6 is rnot commonly found as cause of meningitis in immunocompetent hosts. Further testing is pending (quant HHV-6 on CSF)  Past Medical History:  Diagnosis Date   Atrial fibrillation (HCC)    CHF (congestive heart failure) (HCC)    Hypertension    Renal disorder    Stroke [facility])    Testicular cancer (HCC)     Allergies:  Allergies  Allergen Reactions   Rivaroxaban Diarrhea, Nausea Only and Other (See Comments)    Nausea, diarrhea, abdominal pain Nausea, diarrhea, abdominal pain Nausea, diarrhea, abdominal pain    Sulfamethoxazole-Trimethoprim Rash   MEDICATIONS:  apixaban  5 mg Oral BID   atorvastatin  40 mg Oral Daily   clomiPHENE  25 mg Oral QHS   flecainide  100 mg Oral G26R   folic acid  1 mg Oral Daily   lacosamide  100 mg Oral BID   levETIRAcetam  1,500 mg Oral BID   multivitamin with minerals  1 tablet Oral Daily  sodium bicarbonate  650 mg Oral BID   thiamine  100 mg Oral Daily   Or   thiamine  100 mg Intravenous Daily    Social History   Tobacco Use   Smoking status: Never   Smokeless tobacco: Never  Substance Use Topics   Alcohol use: Yes    Alcohol/week: 21.0 standard drinks of alcohol    Types: 21 Standard drinks or equivalent per week   Drug use: Not Currently    Family History  Problem Relation Age of Onset   Hypertension Mother    Heart disease Father     Review of Systems  Constitutional: +headache. Negative for fever, chills, diaphoresis, activity change, appetite change, fatigue and unexpected weight  change.  HENT: Negative for congestion, sore throat, rhinorrhea, sneezing, trouble swallowing and sinus pressure.  Eyes: Negative for photophobia and visual disturbance.  Respiratory: Negative for cough, chest tightness, shortness of breath, wheezing and stridor.  Cardiovascular: Negative for chest pain, palpitations and leg swelling.  Gastrointestinal: Negative for nausea, vomiting, abdominal pain, diarrhea, constipation, blood in stool, abdominal distention and anal bleeding.  Genitourinary: Negative for dysuria, hematuria, flank pain and difficulty urinating.  Musculoskeletal: Negative for myalgias, back pain, joint swelling, arthralgias and gait problem.  Skin: Negative for color change, pallor, rash and wound.  Neurological: Negative for dizziness, tremors, weakness and light-headedness.  Hematological: Negative for adenopathy. Does not bruise/bleed easily.  Psychiatric/Behavioral: Negative for behavioral problems, confusion, sleep disturbance, dysphoric mood, decreased concentration and agitation.    OBJECTIVE: Temp:  [97.5 F (36.4 C)-98.9 F (37.2 C)] 98.7 F (37.1 C) (08/03 0820) Pulse Rate:  [70-82] 70 (08/03 0820) Resp:  [14-16] 16 (08/03 0424) BP: (112-155)/(72-97) 155/97 (08/03 0820) SpO2:  [92 %-100 %] 97 % (08/03 0820) Physical Exam  Constitutional: He is oriented to person, place, and time. He appears well-developed and well-nourished. No distress. Has EEG leads in place HENT:  Mouth/Throat: Oropharynx is clear and moist. No oropharyngeal exudate.  Cardiovascular: Normal rate, regular rhythm and normal heart sounds. Exam reveals no gallop and no friction rub.  No murmur heard.  Pulmonary/Chest: Effort normal and breath sounds normal. No respiratory distress. He has no wheezes.  Abdominal: Soft. Bowel sounds are normal. He exhibits no distension. There is no tenderness.  Lymphadenopathy:  He has no cervical adenopathy.  Neurological: He is alert and oriented to person,  place, and time.  Skin: Skin is warm and dry. No rash noted. No erythema.  Psychiatric: He has a normal mood and affect. His behavior is normal.    LABS: Results for orders placed or performed during the hospital encounter of 10/20/21 (from the past 48 hour(s))  CBC     Status: Abnormal   Collection Time: 10/21/21  6:02 AM  Result Value Ref Range   WBC 7.7 4.0 - 10.5 K/uL   RBC 3.96 (L) 4.22 - 5.81 MIL/uL   Hemoglobin 12.5 (L) 13.0 - 17.0 g/dL   HCT 37.2 (L) 39.0 - 52.0 %   MCV 93.9 80.0 - 100.0 fL   MCH 31.6 26.0 - 34.0 pg   MCHC 33.6 30.0 - 36.0 g/dL   RDW 14.1 11.5 - 15.5 %   Platelets 237 150 - 400 K/uL   nRBC 0.0 0.0 - 0.2 %    Comment: Performed at [facility], [place] [address], [place], [place] [address]  Basic metabolic panel     Status: Abnormal   Collection Time: 10/21/21  6:02 AM  Result Value Ref Range  Sodium 137 135 - 145 mmol/L   Potassium 4.5 3.5 - 5.1 mmol/L   Chloride 108 98 - 111 mmol/L   CO2 22 22 - 32 mmol/L   Glucose, Bld 93 70 - 99 mg/dL    Comment: Glucose reference range applies only to samples taken after fasting for at least 8 hours.   BUN 20 6 - 20 mg/dL   Creatinine, Ser 1.43 (H) 0.61 - 1.24 mg/dL   Calcium 9.5 8.9 - 10.3 mg/dL   GFR, Estimated 59 (L) >60 mL/min    Comment: (NOTE) Calculated using the CKD-EPI Creatinine Equation (2021)    Anion gap 7 5 - 15    Comment: Performed at [facility] [address], [place], [place] [address]    MICRO: reviewed IMAGING: Overnight EEG with video  Result Date: 10/21/2021 Lora Havens, MD     10/21/2021 10:59 AM Patient Name: Dustin Wells MRN: [id number] Epilepsy Attending: Lora Havens Referring Physician/Provider: Donnetta Simpers, MD Duration: 10/20/2021 1957 to 10/21/2021 1030  Patient history: 56 year old male with altered mental status.  EEG to evaluate for seizure.  Level of alertness: awake, asleep  AEDs during EEG study: LEV, LCM  Technical aspects: This EEG study was done with  scalp electrodes positioned according to the 10-20 International system of electrode placement. Electrical activity was reviewed with band pass filter of 1-'70Hz'$ , sensitivity of 7 uV/mm, display speed of 30m/sec with a '60Hz'$  notched filter applied as appropriate. EEG data were recorded continuously and digitally stored.  Video monitoring was available and reviewed as appropriate.  Description:  The posterior dominant rhythm consists of 9-10 Hz activity of moderate voltage (25-35 uV) seen predominantly in posterior head regions, symmetric and reactive to eye opening and eye closing.  Sleep was characterized by sleep spindles (12 to 14 Hz), maximal frontocentral region. Periodic discharges with overriding fast activity were noted in right parieto-occipital region which at times appeared rhythmic lasting 3-8 seconds consistent with brief-ictal-interictal rhythmic discharges. Hyperventilation and photic stimulation were not performed.    ABNORMALITY - Brief-ictal-interictal rhythmic discharges, right parieto-occipital region  IMPRESSION: This study showed evidence of epileptogenicity arising from right parietal occipital region with increased risk of seizure recurrence.  No definite seizures were seen during the study.  PLora Havens  EEG adult  Result Date: 10/20/2021 YLora Havens MD     10/20/2021  4:26 PM Patient Name: JRodell MarrsMRN: [id number] Attending: PLora HavensReferring Physician/Provider: BLorenza Chick MD Date: 10/20/2021 Duration: 21.57 mins  Patient history: 56year old male with altered mental status.  EEG to evaluate for seizure.  Level of alertness: Awake, asleep  AEDs during EEG study: LEV  Technical aspects: This EEG study was done with scalp electrodes positioned according to the 10-20 International system of electrode placement. Electrical activity was reviewed with band pass filter of 1-'70Hz'$ , sensitivity of 7 uV/mm, display speed of 348msec with a '60Hz'$  notched filter  applied as appropriate. EEG data were recorded continuously and digitally stored.  Video monitoring was available and reviewed as appropriate.  Description: The posterior dominant rhythm consists of 9-10 Hz activity of moderate voltage (25-35 uV) seen predominantly in posterior head regions, symmetric and reactive to eye opening and eye closing.  Sleep was characterized by sleep spindles (12 to 14 Hz), maximal frontocentral region.  Periodic discharges with overriding fast activity were noted in right parieto-occipital region at '1hz'$  which at times appeared rhythmic lasting 3-9 seconds consistent with brief-ictal-interictal rhythmic discharges. Five  electrographic seizures were also noted during which EEG showed evolution in morphology, frequency and involved vertex region. No clinical signs were noted. Average duration 10-15 seconds.  Last seizure was at 1102. Hyperventilation and photic stimulation were not performed.    ABNORMALITY - Electrographic seizure, right parieto-occipital region - Brief-ictal-interictal rhythmic discharges, right parieto-occipital region - Periodic discharges with overriding fast activity, right parieto-occipital regio  IMPRESSION: This study showed five electrographic seizures arising from right parieto-occipital region, lasting about 10-15 seconds each. There is also evidence of epileptogenicity with increased risk of seizure recurrence.   Dr. Curly Shores was notified. 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 Currently on day 6 ganciclovir antiviral therapy - will follow up with labs to see when the QUANTITATIVE test will return. Alternatively, may need to consider repeat LP to see if normalizing and further repeat Quantititative testing - also see that we are checking for HHV-6 quant PCR in serum  - HHV-6 meningitis more common in pediatric or immunocompromised adult  population. Often thought to be reactivation. Case reports have discussed 14 day treatment. We will need more information and data points to see if this should be the plan of action  Side effects = will watch for kidney function and leukopenia associated with GCV use.

## 2021-10-21 NOTE — Progress Notes (Signed)
Pharmacy - Ganciclovir dosing (brief note)  Patient transferred from [facility] to [facility] for continuous EEG monitoring  Ganciclovir re-ordered at incorrect dose upon arrival to [facility], based on CrCL > 70,  Change back to ganciclovir '5mg'$ /kg ('465mg'$ ) IV q12h based on adj BW for BMI > 30.    See my progress note from 8/2 for details  Doreene Eland, PharmD, BCPS, [place] Work Cell: [phone] 10/21/2021 8:42 AM

## 2021-10-21 NOTE — Progress Notes (Signed)
Performed maintenance on leads.  Fixed A 2.  All under 10,  No visible skin breakdown.

## 2021-10-21 NOTE — Progress Notes (Addendum)
Progress Note  Patient: Dustin Wells UMP:[id number] DOB: 1966/02/05  DOA: 10/20/2021  DOS: 10/21/2021    Brief hospital course: Dustin Wells is a 56 y.o. male with a history of stage II seminoma s/p XRT, right parietal CVA, alcohol abuse, HTN, prediabetes, HLD, AFib, chronic HFpEF, obesity, OSA on CPAP, thrombocytopenia who presented initially to [facility] 7/26 with presyncope and hypotension improved with IVF, though was febrile prior to discharge, and sent home only to return 7/28 with continued back pain, neck pain, confusion, urinary incontinence, abdominal pain and distention, fever. Among other work up, CT abd/pelvis showed splenomegaly and bilateral perinephric stranding. He met sepsis criteria, started on ceftriaxone. ID was consulted, LP on 7/29 was done for the concern of meningitis, showed a neutrophilic pleocytosis (WBC 804, 70% PMNs), protein 105, and HHV-6 positive. Antibiotics were broadened, a single dose of decadron was given, and acyclovir given empirically initially. This was changed to ganciclovir, and empiric antibiotics ultimately were stopped. To date, blood and CSF cultures have remained negative. CSF cultures negative. MRI brain revealed stable encephalomalacia of right posterior temporal and parietal lobe. Neurology was consulted, noting he had several episodes per day of uncontrollable left hand spasms and started keppra. Additional AEDs added due to persistent seizure activity, prompting transfer to [facility] on 8/2 for continuous EEG.   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  - HHV-6 PCR confirmatory testing is pending.  - Continue ganciclovir per pharmacy - Antibiotics have been stopped. Monitoring cultures (remain NGTD) - ID, Dr. West Bali, consulted for ongoing recommendations.  - Note discussion by Dr. Curly Shores in consult  note 8/1 suspecting HHV-6 encephalitis is false positive, autoimmune/paraneoplastic encephalitis similarly unlikely as he improved more quickly than would be expected, even with treatments as given. The severity of the pleocytosis in CSF remains unexplained, too high to attribute to focal status epilepticus.   Sepsis: Met criteria at admission. Will further delineate as clinical data become available.   Bilateral perinephric stranding: With prominent urinary symptoms PTA, though no WBCs on urinalysis. There were RBCs, so ?passed stone.   - ID recommended stopping antibiotics after 4 days of treatment. We will plan to continue monitoring off antibiotics and/or follow any updated recommendations by ID.  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  - On continuous EEG per neurology recommendations. Showing epileptogenicity.  - Continue keppra, vimpat; switch to po per neurology - Continue prn ativan - Continue seizure precautions - Given phenobarbital 8/3  Acute metabolic encephalopathy: Also likely due to seizures/post-ictal. Essentially resolved.  Alcohol abuse: EtOH negative at admission. - No current evidence of withdrawal.  - Cessation counseling provided.   Splenomegaly, thrombocytopenia: Chronic. Platelets have normalized over past few days.  OSA:  - CPAP qHS advised (hasn't been using) - Will follow up for reevaluation as outpatient to get new machine.   Chronic HFpEF: Stable - Was given IV lasix x1, though acute decompensation not suspected at this time. Will continue to hold torsemide and losartan (said to be on hold PTA anyway) - May restart norvasc and metoprolol if confirmed by med rec.   Paroxysmal atrial fibrillation: Followed by [facility] cardiology, has had multiple cardioversions, previously on amiodarone.  - Continue eliquis - Continue flecainide for rhythm control, currently in NSR/1st deg AVB - Continue telemetry monitoring  for now.  - Metoprolol said to be used for rate control  per cardiology note, though currently held. Will restart at lower dose given HR in 60-70's.  Stage II CKD: CrCl is currently 40m/min and continuing to improve, so previously cited stage IIIb no longer appears accurate.  - Avoid nephrotoxins.   History of right parietal embolic CVA: With imaging showing encephalomalacia and EEG showing focal findings.  - Will take pains to remain on anticoagulation - Continue statin  Elevated LFTs: No focal hepatic abnormality on CT, no biliary dilatation.  - Monitor, mild.  Acute hypoxic respiratory failure: Unclear etiology, possible hypoventilation due to encephalopathy. CXR at admission showed atelectasis vs./favored over infiltrate without pulmonary edema. PE unlikely with chronic anticoagulation. This has resolved, so no further work up is currently planned.  HLD:  - Continue statin  History of seminoma: S/p radiation.  - Continue clomiphene.  Obesity: Estimated body mass index is 34.56 kg/m as calculated from the following:   Height as of 10/15/21: 6' (1.829 m).   Weight as of 10/19/21: 115.6 kg. - Since indication for ozempic is not diabetes, will hold weekly dose while admitted.  Subjective: Left hand weakness/spasms have improved today, having some back pain still, attributes this to being in bed constantly. Had normal BM this morning, no fevers, does not feel ill currently.  Objective: Vitals:   10/20/21 2313 10/21/21 0423 10/21/21 0424 10/21/21 0820  BP: 112/72  134/89 (!) 155/97  Pulse: 72 74 77 70  Resp: 16  16   Temp: 97.8 F (36.6 C)  98.9 F (37.2 C) 98.7 F (37.1 C)  TempSrc: Oral  Oral Oral  SpO2: 93% 96% 92% 97%   Gen: Obese, alert 56y.o. male in no distress Pulm: Nonlabored breathing room air. Clear bilaterally CV: Regular rate and rhythm. No murmur, rub, or gallop. No JVD, no pitting dependent edema. GI: Abdomen soft, and nontender, distended with normoactive  bowel sounds.  Ext: Warm, no deformities Skin: No rashes, lesions or ulcers on visualized skin. Neuro: Alert and oriented. No asterixis or tremor, no significant sensory or motor deficits noted. Psych: Judgement and insight appear fair. Mood euthymic & affect congruent. Behavior is appropriate.    Data Personally reviewed: CBC: Recent Labs  Lab 10/15/21 0949 10/15/21 1023 10/16/21 0723 10/19/21 1634 10/20/21 0437 10/21/21 0602  WBC 8.7 8.6 10.9* 6.9 7.1 7.7  NEUTROABS 6.2 7.0  --  4.3  --   --   HGB 13.5 13.1 12.8* 13.0 13.6 12.5*  HCT 40.2 37.8* 36.9* 39.5 41.1 37.2*  MCV 91.4 90.2 91.3 92.7 93.6 93.9  PLT 128* 107* 129* 229 261 2782  Basic Metabolic Panel: Recent Labs  Lab 10/16/21 0723 10/17/21 1020 10/19/21 0511 10/20/21 0437 10/21/21 0602  NA 137 134* 138 139 137  K 3.7 3.4* 4.0 4.3 4.5  CL 102 97* 105 108 108  CO2 21* 25 26 21* 22  GLUCOSE 181* 195* 92 106* 93  BUN 29* 41* 33* 30* 20  CREATININE 2.09* 1.95* 1.68* 1.59* 1.43*  CALCIUM 8.5* 8.8* 9.2 9.6 9.5  MG  --   --  1.9 2.1  --    GFR: Estimated Creatinine Clearance: 78.4 mL/min (A) (by C-G formula based on SCr of 1.43 mg/dL (H)). Liver Function Tests: Recent Labs  Lab 10/15/21 0949 10/15/21 1023 10/20/21 0437  AST 64* 60* 55*  ALT 36 35 69*  ALKPHOS 66 68 68  BILITOT 1.7* 1.6* 1.4*  PROT 7.6 7.2 7.3  ALBUMIN 3.6 3.4* 3.3*   Recent Labs  Lab 10/15/21 [id number]  LIPASE 78*   Recent Labs  Lab 10/15/21 2002  AMMONIA 32   Coagulation Profile: Recent Labs  Lab 10/15/21 0949 10/15/21 2033  INR 1.5* 1.5*   Cardiac Enzymes: No results for input(s): "CKTOTAL", "CKMB", "CKMBINDEX", "TROPONINI" in the last 168 hours. BNP (last 3 results) No results for input(s): "PROBNP" in the last 8760 hours. HbA1C: No results for input(s): "HGBA1C" in the last 72 hours. CBG: Recent Labs  Lab 10/19/21 1656 10/19/21 2016 10/20/21 0828 10/20/21 1126 10/20/21 1746  GLUCAP 108* 84 89 83 109*   Lipid  Profile: No results for input(s): "CHOL", "HDL", "LDLCALC", "TRIG", "CHOLHDL", "LDLDIRECT" in the last 72 hours. Thyroid Function Tests: No results for input(s): "TSH", "T4TOTAL", "FREET4", "T3FREE", "THYROIDAB" in the last 72 hours. Anemia Panel: No results for input(s): "VITAMINB12", "FOLATE", "FERRITIN", "TIBC", "IRON", "RETICCTPCT" in the last 72 hours. Urine analysis:    Component Value Date/Time   COLORURINE YELLOW (A) 10/15/2021 1221   APPEARANCEUR CLEAR (A) 10/15/2021 1221   APPEARANCEUR Clear 09/29/2021 0912   LABSPEC 1.012 10/15/2021 1221   PHURINE 6.0 10/15/2021 1221   GLUCOSEU 50 (A) 10/15/2021 1221   HGBUR MODERATE (A) 10/15/2021 1221   BILIRUBINUR NEGATIVE 10/15/2021 1221   BILIRUBINUR Negative 09/29/2021 0912   KETONESUR NEGATIVE 10/15/2021 1221   PROTEINUR 30 (A) 10/15/2021 1221   NITRITE NEGATIVE 10/15/2021 1221   LEUKOCYTESUR NEGATIVE 10/15/2021 1221   Recent Results (from the past 240 hour(s))  SARS Coronavirus 2 by RT PCR (hospital order, performed in [facility] hospital lab) *cepheid single result test* Urine, Clean Catch     Status: None   Collection Time: 10/13/21  4:29 PM   Specimen: Urine, Clean Catch; Nasal Swab  Result Value Ref Range Status   SARS Coronavirus 2 by RT PCR NEGATIVE NEGATIVE Final    Comment: (NOTE) SARS-CoV-2 target nucleic acids are NOT DETECTED.  The SARS-CoV-2 RNA is generally detectable in upper and lower respiratory specimens during the acute phase of infection. The lowest concentration of SARS-CoV-2 viral copies this assay can detect is 250 copies / mL. A negative result does not preclude SARS-CoV-2 infection and should not be used as the sole basis for treatment or other patient management decisions.  A negative result may occur with improper specimen collection / handling, submission of specimen other than nasopharyngeal swab, presence of viral mutation(s) within the areas targeted by this assay, and inadequate number of  viral copies (<250 copies / mL). A negative result must be combined with clinical observations, patient history, and epidemiological information.  Fact Sheet for Patients:   https://www.patel.info/  Fact Sheet for Healthcare Providers: https://hall.com/  This test is not yet approved or  cleared by the [place] FDA and has been authorized for detection and/or diagnosis of SARS-CoV-2 by FDA under an Emergency Use Authorization (EUA).  This EUA will remain in effect (meaning this test can be used) for the duration of the COVID-19 declaration under Section 564(b)(1) of the Act, 21 U.S.C. section 360bbb-3(b)(1), unless the authorization is terminated or revoked sooner.  Performed at [facility], [place], [place] [address]   Resp Panel by RT-PCR (Flu A&B, Covid) Anterior Nasal Swab     Status: None   Collection Time: 10/15/21 10:23 AM   Specimen: Anterior Nasal Swab  Result Value Ref Range Status   SARS Coronavirus 2 by RT PCR NEGATIVE NEGATIVE Final    Comment: (NOTE) SARS-CoV-2 target nucleic acids are NOT DETECTED.  The SARS-CoV-2 RNA is generally detectable  in upper respiratory specimens during the acute phase of infection. The lowest concentration of SARS-CoV-2 viral copies this assay can detect is 138 copies/mL. A negative result does not preclude SARS-Cov-2 infection and should not be used as the sole basis for treatment or other patient management decisions. A negative result may occur with  improper specimen collection/handling, submission of specimen other than nasopharyngeal swab, presence of viral mutation(s) within the areas targeted by this assay, and inadequate number of viral copies(<138 copies/mL). A negative result must be combined with clinical observations, patient history, and epidemiological information. The expected result is Negative.  Fact Sheet for Patients:   EntrepreneurPulse.com.au  Fact Sheet for Healthcare Providers:  IncredibleEmployment.be  This test is no t yet approved or cleared by the [place] FDA and  has been authorized for detection and/or diagnosis of SARS-CoV-2 by FDA under an Emergency Use Authorization (EUA). This EUA will remain  in effect (meaning this test can be used) for the duration of the COVID-19 declaration under Section 564(b)(1) of the Act, 21 U.S.C.section 360bbb-3(b)(1), unless the authorization is terminated  or revoked sooner.       Influenza A by PCR NEGATIVE NEGATIVE Final   Influenza B by PCR NEGATIVE NEGATIVE Final    Comment: (NOTE) The Xpert Xpress SARS-CoV-2/FLU/RSV plus assay is intended as an aid in the diagnosis of influenza from Nasopharyngeal swab specimens and should not be used as a sole basis for treatment. Nasal washings and aspirates are unacceptable for Xpert Xpress SARS-CoV-2/FLU/RSV testing.  Fact Sheet for Patients: EntrepreneurPulse.com.au  Fact Sheet for Healthcare Providers: IncredibleEmployment.be  This test is not yet approved or cleared by the [place] FDA and has been authorized for detection and/or diagnosis of SARS-CoV-2 by FDA under an Emergency Use Authorization (EUA). This EUA will remain in effect (meaning this test can be used) for the duration of the COVID-19 declaration under Section 564(b)(1) of the Act, 21 U.S.C. section 360bbb-3(b)(1), unless the authorization is terminated or revoked.  Performed at [facility], [address] [place], [place] [address]   Blood Culture (routine x 2)     Status: None   Collection Time: 10/15/21 10:23 AM   Specimen: BLOOD  Result Value Ref Range Status   Specimen Description BLOOD LEFT ANTECUBITAL  Final   Special Requests   Final    BOTTLES DRAWN AEROBIC AND ANAEROBIC Blood Culture adequate volume   Culture   Final    NO GROWTH  5 DAYS Performed at [facility], [address] [place], [place] [address]    Report Status 10/20/2021 FINAL  Final  Urine Culture     Status: None   Collection Time: 10/15/21 12:21 PM   Specimen: In/Out Cath Urine  Result Value Ref Range Status   Specimen Description   Final    IN/OUT CATH URINE Performed at [facility], [address], [place], [place] [address]    Special Requests   Final    NONE Performed at [facility], [address], [place], [place] [address]    Culture   Final    NO GROWTH Performed at [facility], [place] [address] [place], [place] [address]    Report Status 10/16/2021 FINAL  Final  CSF culture w Gram Stain     Status: None   Collection Time: 10/15/21  4:57 PM   Specimen: CSF; Cerebrospinal Fluid  Result Value Ref Range Status   Specimen Description   Final    CSF  Performed at [facility], [address] [place], [place] [address]    Special Requests   Final    NONE Performed at [facility], [address] [place], [place] [address]    Gram Stain   Final    WBC SEEN RED BLOOD CELLS PRESENT NO ORGANISMS SEEN Performed at [facility], [address] [place], [place] [address]    Culture   Final    NO GROWTH 3 DAYS Performed at [facility], [place] [address], [place], [place] [address]    Report Status 10/19/2021 FINAL  Final  Culture, fungus without smear     Status: None (Preliminary result)   Collection Time: 10/15/21  4:58 PM   Specimen: CSF; Cerebrospinal Fluid  Result Value Ref Range Status   Specimen Description   Final    CSF Performed at [facility], [address] [place], [place] [address]    Special Requests   Final    NONE Performed at [facility], [address] [place], [place] [address]    Culture   Final    NO FUNGUS ISOLATED AFTER 6 DAYS Performed at [facility], [place] [address], [place], [place] [address]     Report Status PENDING  Incomplete  Culture, blood (Routine X 2) w Reflex to ID Panel     Status: None (Preliminary result)   Collection Time: 10/17/21  6:01 PM   Specimen: BLOOD LEFT ARM  Result Value Ref Range Status   Specimen Description BLOOD LEFT ARM  Final   Special Requests   Final    BOTTLES DRAWN AEROBIC AND ANAEROBIC BACTEROIDES CACCAE   Culture   Final    NO GROWTH 4 DAYS Performed at [facility], [address], [place], [place] [address]    Report Status PENDING  Incomplete  Culture, blood (Routine X 2) w Reflex to ID Panel     Status: None (Preliminary result)   Collection Time: 10/19/21  5:11 AM   Specimen: BLOOD  Result Value Ref Range Status   Specimen Description BLOOD RIGHT [facility]  Final   Special Requests   Final    BOTTLES DRAWN AEROBIC AND ANAEROBIC Blood Culture adequate volume   Culture   Final    NO GROWTH 2 DAYS Performed at [facility], [address], [place], [place] [address]    Report Status PENDING  Incomplete  Culture, blood (Routine X 2) w Reflex to ID Panel     Status: None (Preliminary result)   Collection Time: 10/19/21  5:11 AM   Specimen: BLOOD  Result Value Ref Range Status   Specimen Description BLOOD RIGHT HAND  Final   Special Requests   Final    BOTTLES DRAWN AEROBIC AND ANAEROBIC Blood Culture results may not be optimal due to an inadequate volume of blood received in culture bottles   Culture   Final    NO GROWTH 2 DAYS Performed at [facility], [address] [place], [place] [address]    Report Status PENDING  Incomplete     Overnight EEG with video  Result Date: 10/21/2021 Lora Havens, MD     10/21/2021 10:59 AM Patient Name: Niv Darley MRN: [id number] Epilepsy Attending: Lora Havens Referring Physician/Provider: Donnetta Simpers, MD Duration: 10/20/2021 1957 to 10/21/2021 1030  Patient history: 56 year old male with altered mental status.  EEG to evaluate for seizure.  Level of alertness:  awake, asleep  AEDs during  EEG study: LEV, LCM  Technical aspects: This EEG study was done with scalp electrodes positioned according to the 10-20 International system of electrode placement. Electrical activity was reviewed with band pass filter of 1-_0 , sensitivity of 7 uV/mm, display speed of 46m/sec with a _1  notched filter applied as appropriate. EEG data were recorded continuously and digitally stored.  Video monitoring was available and reviewed as appropriate.  Description:  The posterior dominant rhythm consists of 9-10 Hz activity of moderate voltage (25-35 uV) seen predominantly in posterior head regions, symmetric and reactive to eye opening and eye closing.  Sleep was characterized by sleep spindles (12 to 14 Hz), maximal frontocentral region. Periodic discharges with overriding fast activity were noted in right parieto-occipital region which at times appeared rhythmic lasting 3-8 seconds consistent with brief-ictal-interictal rhythmic discharges. Hyperventilation and photic stimulation were not performed.    ABNORMALITY - Brief-ictal-interictal rhythmic discharges, right parieto-occipital region  IMPRESSION: This study showed evidence of epileptogenicity arising from right parietal occipital region with increased risk of seizure recurrence.  No definite seizures were seen during the study.  PLora Havens  EEG adult  Result Date: 10/20/2021 YLora Havens MD     10/20/2021  4:26 PM Patient Name: JShandell JallowMRN: [id number] Attending: PLora HavensReferring Physician/Provider: BLorenza Chick MD Date: 10/20/2021 Duration: 21.57 mins  Patient history: 56year old male with altered mental status.  EEG to evaluate for seizure.  Level of alertness: Awake, asleep  AEDs during EEG study: LEV  Technical aspects: This EEG study was done with scalp electrodes positioned according to the 10-20 International system of electrode placement. Electrical activity was reviewed with band pass  filter of 1-_2 , sensitivity of 7 uV/mm, display speed of 359msec with a _3  notched filter applied as appropriate. EEG data were recorded continuously and digitally stored.  Video monitoring was available and reviewed as appropriate.  Description: The posterior dominant rhythm consists of 9-10 Hz activity of moderate voltage (25-35 uV) seen predominantly in posterior head regions, symmetric and reactive to eye opening and eye closing.  Sleep was characterized by sleep spindles (12 to 14 Hz), maximal frontocentral region.  Periodic discharges with overriding fast activity were noted in right parieto-occipital region at _4  which at times appeared rhythmic lasting 3-9 seconds consistent with brief-ictal-interictal rhythmic discharges. Five electrographic seizures were also noted during which EEG showed evolution in morphology, frequency and involved vertex region. No clinical signs were noted. Average duration 10-15 seconds.  Last seizure was at 1102. Hyperventilation and photic stimulation were not performed.    ABNORMALITY - Electrographic seizure, right parieto-occipital region - Brief-ictal-interictal rhythmic discharges, right parieto-occipital region - Periodic discharges with overriding fast activity, right parieto-occipital regio  IMPRESSION: This study showed five electrographic seizures arising from right parieto-occipital region, lasting about 10-15 seconds each. There is also evidence of epileptogenicity with increased risk of seizure recurrence.   Dr. BhCurly Shoresas notified. PrLora Havens EEG adult  Result Date: 10/19/2021 YaLora HavensMD     10/19/2021  4:36 PM Patient Name: JaJohnaton SonnebornRN: [id number] Attending: PrLora Havenseferring Physician/Provider: BhLorenza ChickMD Date: 10/19/2021 Duration: 29.57 mins Patient history: [age]ear old male with altered mental status.  EEG to evaluate for seizure. Level of alertness: Awake AEDs during EEG study: None Technical aspects: This  EEG study was done with scalp electrodes positioned according to the 10-20 International system of electrode placement. Electrical activity was reviewed with band pass filter of 1-_5 , sensitivity  of 7 uV/mm, display speed of 32m/sec with a _0  notched filter applied as appropriate. EEG data were recorded continuously and digitally stored.  Video monitoring was available and reviewed as appropriate. Description: The posterior dominant rhythm consists of 9-10 Hz activity of moderate voltage (25-35 uV) seen predominantly in posterior head regions, symmetric and reactive to eye opening and eye closing. Periodic discharges with overriding fast activity were noted in right parieto-occipital region at _1  which at times appeared rhythmic lasting 2-5 seconds consistent with brief-ictal-interictal rhythmic discharges. Six electrographic seizures were also noted during which EEG showed evolution in morphology, frequency and involved vertex region. No clinical signs were noted. Average duration 30-45 seconds.  Last seizure was at 1546. Hyperventilation and photic stimulation were not performed.   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 There is also evidence of epileptogenicity with increased risk of seizure recurrence. Consider long term eeg monitoring. Dr. BCurly Shoreswas notified. PCaney   Family Communication: None at bedside  Disposition: Status is: Inpatient Remains inpatient appropriate because: Severity of illness, continuous EEG Planned Discharge Destination:  TBD  RPatrecia Pour MD 10/21/2021 1:55 PM Page by [email or web address] [email or web address]

## 2021-10-21 NOTE — Procedures (Addendum)
Patient Name: Dustin Wells  MRN: [id number]  Epilepsy Attending: Lora Havens  Referring Physician/Provider: Donnetta Simpers, MD  Duration: 10/20/2021 1957 to 10/21/2021 1957   Patient history: 55 year old male with altered mental status.  EEG to evaluate for seizure.   Level of alertness: awake, asleep   AEDs during EEG study: LEV, LCM   Technical aspects: This EEG study was done with scalp electrodes positioned according to the 10-20 International system of electrode placement. Electrical activity was reviewed with band pass filter of 1-'70Hz'$ , sensitivity of 7 uV/mm, display speed of 21m/sec with a '60Hz'$  notched filter applied as appropriate. EEG data were recorded continuously and digitally stored.  Video monitoring was available and reviewed as appropriate.   Description:  The posterior dominant rhythm consists of 9-10 Hz activity of moderate voltage (25-35 uV) seen predominantly in posterior head regions, symmetric and reactive to eye opening and eye closing.  Sleep was characterized by sleep spindles (12 to 14 Hz), maximal frontocentral region.   Periodic discharges with overriding fast activity were noted in right parieto-occipital region which at times appeared rhythmic lasting 3-8 seconds consistent with brief-ictal-interictal rhythmic discharges. Hyperventilation and photic stimulation were not performed.      ABNORMALITY - Brief-ictal-interictal rhythmic discharges, right parieto-occipital region   IMPRESSION: This study showed evidence of epileptogenicity arising from right parietal occipital region with increased risk of seizure recurrence.  No definite seizures were seen during the study.   Darien Kading OBarbra Sarks

## 2021-10-21 NOTE — Progress Notes (Signed)
Heparin gtt stopped at 2127 per Dr. Alcario Drought

## 2021-10-22 ENCOUNTER — Inpatient Hospital Stay (HOSPITAL_COMMUNITY): Payer: 59

## 2021-10-22 DIAGNOSIS — G0490 Encephalitis and encephalomyelitis, unspecified: Secondary | ICD-10-CM | POA: Diagnosis not present

## 2021-10-22 LAB — COMPREHENSIVE METABOLIC PANEL
ALT: 36 U/L (ref 0–44)
ALT: 56 U/L — ABNORMAL HIGH (ref 0–44)
AST: 64 U/L — ABNORMAL HIGH (ref 15–41)
AST: 36 U/L (ref 15–41)
Albumin: 3.6 g/dL (ref 3.5–5.0)
Albumin: 3 g/dL — ABNORMAL LOW (ref 3.5–5.0)
Alkaline Phosphatase: 66 U/L (ref 38–126)
Alkaline Phosphatase: 74 U/L (ref 38–126)
Anion gap: 11 (ref 5–15)
Anion gap: 9 (ref 5–15)
BUN: 29 mg/dL — ABNORMAL HIGH (ref 6–20)
BUN: 16 mg/dL (ref 6–20)
CO2: 23 mmol/L (ref 22–32)
CO2: 22 mmol/L (ref 22–32)
Calcium: 9.4 mg/dL (ref 8.9–10.3)
Calcium: 9.6 mg/dL (ref 8.9–10.3)
Chloride: 102 mmol/L (ref 98–111)
Chloride: 103 mmol/L (ref 98–111)
Creatinine, Ser: 2.67 mg/dL — ABNORMAL HIGH (ref 0.61–1.24)
Creatinine, Ser: 1.48 mg/dL — ABNORMAL HIGH (ref 0.61–1.24)
GFR, Estimated: 26 mL/min — ABNORMAL LOW (ref >60)
GFR, Estimated: 56 mL/min — ABNORMAL LOW (ref >60)
Glucose, Bld: 152 mg/dL — ABNORMAL HIGH (ref 70–99)
Glucose, Bld: 91 mg/dL (ref 70–99)
Potassium: 3.8 mmol/L (ref 3.5–5.1)
Potassium: 4.4 mmol/L (ref 3.5–5.1)
Sodium: 136 mmol/L (ref 135–145)
Sodium: 134 mmol/L — ABNORMAL LOW (ref 135–145)
Total Bilirubin: 1.7 mg/dL — ABNORMAL HIGH (ref 0.3–1.2)
Total Bilirubin: 0.9 mg/dL (ref 0.3–1.2)
Total Protein: 7.6 g/dL (ref 6.5–8.1)
Total Protein: 6.6 g/dL (ref 6.5–8.1)

## 2021-10-22 LAB — CSF CELL COUNT WITH DIFFERENTIAL
Eosinophils, CSF: 0 % (ref 0–1)
Lymphs, CSF: 92 % — ABNORMAL HIGH (ref 40–80)
Monocyte-Macrophage-Spinal Fluid: 8 % — ABNORMAL LOW (ref 15–45)
RBC Count, CSF: 1 /mm3 — ABNORMAL HIGH
Segmented Neutrophils-CSF: 0 % (ref 0–6)
Tube #: 3
WBC, CSF: 32 /mm3 (ref 0–5)

## 2021-10-22 LAB — CULTURE, BLOOD (ROUTINE X 2): Culture: NO GROWTH

## 2021-10-22 LAB — CBC WITH DIFFERENTIAL/PLATELET
Abs Immature Granulocytes: 0.14 10*3/uL — ABNORMAL HIGH (ref 0.00–0.07)
Basophils Absolute: 0.1 10*3/uL (ref 0.0–0.1)
Basophils Relative: 1 %
Eosinophils Absolute: 0.2 10*3/uL (ref 0.0–0.5)
Eosinophils Relative: 3 %
HCT: 40.8 % (ref 39.0–52.0)
Hemoglobin: 13.5 g/dL (ref 13.0–17.0)
Immature Granulocytes: 2 %
Lymphocytes Relative: 20 %
Lymphs Abs: 1.3 10*3/uL (ref 0.7–4.0)
MCH: 31.8 pg (ref 26.0–34.0)
MCHC: 33.1 g/dL (ref 30.0–36.0)
MCV: 96 fL (ref 80.0–100.0)
Monocytes Absolute: 0.5 10*3/uL (ref 0.1–1.0)
Monocytes Relative: 8 %
Neutro Abs: 4.3 10*3/uL (ref 1.7–7.7)
Neutrophils Relative %: 66 %
Platelets: 240 10*3/uL (ref 150–400)
RBC: 4.25 MIL/uL (ref 4.22–5.81)
RDW: 14.1 % (ref 11.5–15.5)
WBC: 6.5 10*3/uL (ref 4.0–10.5)
nRBC: 0 % (ref 0.0–0.2)

## 2021-10-22 LAB — APTT: aPTT: 35 seconds (ref 24–36)

## 2021-10-22 LAB — MENINGITIS/ENCEPHALITIS PANEL (CSF)
Cryptococcus neoformans/gattii (CSF): NOT DETECTED
Cytomegalovirus (CSF): NOT DETECTED
Enterovirus (CSF): NOT DETECTED
Escherichia coli K1 (CSF): NOT DETECTED
Haemophilus influenzae (CSF): NOT DETECTED
Herpes simplex virus 1 (CSF): NOT DETECTED
Herpes simplex virus 2 (CSF): NOT DETECTED
Human herpesvirus 6 (CSF): DETECTED — AB
Human parechovirus (CSF): NOT DETECTED
Listeria monocytogenes (CSF): NOT DETECTED
Neisseria meningitis (CSF): NOT DETECTED
Streptococcus agalactiae (CSF): NOT DETECTED
Streptococcus pneumoniae (CSF): NOT DETECTED
Varicella zoster virus (CSF): NOT DETECTED

## 2021-10-22 LAB — HEMOGLOBIN A1C
Hgb A1c MFr Bld: 5.8 % — ABNORMAL HIGH (ref 4.8–5.6)
Mean Plasma Glucose: 119.76 mg/dL

## 2021-10-22 LAB — GLUCOSE, CSF: Glucose, CSF: 44 mg/dL (ref 40–70)

## 2021-10-22 LAB — PROTEIN, CSF: Total  Protein, CSF: 70 mg/dL — ABNORMAL HIGH (ref 15–45)

## 2021-10-22 LAB — HEPARIN LEVEL (UNFRACTIONATED): Heparin Unfractionated: 1.01 IU/mL — ABNORMAL HIGH (ref 0.30–0.70)

## 2021-10-22 MED ORDER — PHENOBARBITAL 32.4 MG PO TABS: 16.2000 mg | ORAL_TABLET | Freq: Every day | ORAL | Status: DC

## 2021-10-22 MED ORDER — METOPROLOL SUCCINATE ER 25 MG PO TB24
25.0000 mg | ORAL_TABLET | Freq: Every day | ORAL | Status: DC
  Administered 2021-10-22 – 2021-10-25 (×4): 25 mg via ORAL
  Filled 2021-10-22 (×4): qty 1

## 2021-10-22 MED ORDER — VITAMIN B-12 1000 MCG PO TABS
1000.0000 ug | ORAL_TABLET | Freq: Every day | ORAL | Status: DC
  Administered 2021-10-22 – 2021-10-25 (×4): 1000 ug via ORAL
  Filled 2021-10-22 (×4): qty 1

## 2021-10-22 MED ORDER — PHENOBARBITAL 32.4 MG PO TABS
97.2000 mg | ORAL_TABLET | Freq: Every day | ORAL | Status: AC
  Administered 2021-10-22 – 2021-10-23 (×2): 97.2 mg via ORAL
  Filled 2021-10-22 (×2): qty 3

## 2021-10-22 MED ORDER — LIDOCAINE HCL (PF) 1 % IJ SOLN
5.0000 mL | Freq: Once | INTRAMUSCULAR | Status: AC
  Administered 2021-10-22: 5 mL via INTRADERMAL

## 2021-10-22 MED ORDER — PHENOBARBITAL 32.4 MG PO TABS: 32.4000 mg | ORAL_TABLET | Freq: Every day | ORAL | Status: DC

## 2021-10-22 MED ORDER — PHENOBARBITAL 32.4 MG PO TABS
64.8000 mg | ORAL_TABLET | Freq: Every day | ORAL | Status: AC
Start: 2021-10-24 — End: 2021-10-25
  Administered 2021-10-24 – 2021-10-25 (×2): 64.8 mg via ORAL
  Filled 2021-10-22 (×2): qty 2

## 2021-10-22 MED ORDER — ALLOPURINOL 100 MG PO TABS
300.0000 mg | ORAL_TABLET | Freq: Every day | ORAL | Status: DC
Start: 2021-10-22 — End: 2021-10-25
  Administered 2021-10-22 – 2021-10-25 (×4): 300 mg via ORAL
  Filled 2021-10-22 (×4): qty 3

## 2021-10-22 MED ORDER — HEPARIN (PORCINE) 25000 UT/250ML-% IV SOLN
1900.0000 [IU]/h | INTRAVENOUS | Status: DC
  Administered 2021-10-22: 1600 [IU]/h via INTRAVENOUS
  Administered 2021-10-23: 1700 [IU]/h via INTRAVENOUS
  Filled 2021-10-22 (×3): qty 250

## 2021-10-22 NOTE — Progress Notes (Signed)
Addendum to previous note (deleted), dated 10/22/2021.   Performed Maintenance.  Fixed leads A2, F7, and FP1.  All under 10.  No visible skin breakdown.

## 2021-10-22 NOTE — TOC Initial Note (Signed)
Transition of Care [facility]) - Initial/Assessment Note    Patient Details  Name: Dustin Wells MRN: [id number] Date of Birth: January 14, 1966  Transition of Care [facility]) CM/SW Contact:    Pollie Friar, RN Phone Number: 10/22/2021, 3:04 PM  Clinical Narrative:                 Patient is from home with his spouse who works from home. He denies any DME at home.  Pt manages his own medications and denies any issues. Pts spouse does the driving.  Pt states he would like a walker for home.  TOC following for d/c needs.   Expected Discharge Plan: Home/Self Care Barriers to Discharge: Continued Medical Work up   Patient Goals and CMS Choice        Expected Discharge Plan and Services Expected Discharge Plan: Home/Self Care       Living arrangements for the past 2 months: Single Family Home                                      Prior Living Arrangements/Services Living arrangements for the past 2 months: Single Family Home Lives with:: Spouse Patient language and need for interpreter reviewed:: Yes Do you feel safe going back to the place where you live?: Yes        Care giver support system in place?: Yes (comment)   Criminal Activity/Legal Involvement Pertinent to Current Situation/Hospitalization: No - Comment as needed  Activities of Daily Living      Permission Sought/Granted                  Emotional Assessment Appearance:: Appears stated age Attitude/Demeanor/Rapport: Engaged Affect (typically observed): Accepting Orientation: : Oriented to Self, Oriented to Place, Oriented to  Time, Oriented to Situation Alcohol / Substance Use: Alcohol Use Psych Involvement: No (comment)  Admission diagnosis:  Encephalitis [G04.90] Patient Active Problem List   Diagnosis Date Noted   Seizures ([place]) 10/20/2021   Encephalitis    Acute metabolic encephalopathy 17/49/4496   Atrial fibrillation, chronic ([place]) 10/15/2021   Depression with anxiety 10/15/2021   Sepsis  ([place]) 10/15/2021   Thrombocytopenia ([place]) 10/15/2021   HTN (hypertension) 10/15/2021   HLD (hyperlipidemia) 10/15/2021   Obesity with body mass index of 30.0-39.9 10/15/2021   Testicular cancer ([place]) 10/15/2021   Chronic diastolic CHF (congestive heart failure) ([place]) 10/15/2021   Abnormal LFTs 10/15/2021   Abdominal distention 10/15/2021   Acute respiratory failure with hypoxia ([place]) 10/15/2021   Syncope 10/13/2021   Morbid obesity ([place]) 10/13/2021   OSA (obstructive sleep apnea) 10/13/2021   AKI (acute kidney injury) ([place]) 10/13/2021   Stage 3b chronic kidney disease (CKD) ([place]) 10/13/2021   A-fib ([place]) 10/13/2021   Alcohol use disorder, severe, dependence ([place]) 07/01/2019   MDD (major depressive disorder), recurrent episode, moderate ([place]) 07/01/2019   GAD (generalized anxiety disorder) 07/01/2019   PCP:  Leonel Ramsay, MD Pharmacy:   [id number] [place], [place] - [address] AT [address] [place] [address] Phone: [phone] Fax: [phone]  [facility] [address], [place] - [place] [address] Phone: [phone] Fax: [phone]     Social Determinants of Health (SDOH) Interventions    Readmission Risk Interventions     No data to display

## 2021-10-22 NOTE — Progress Notes (Signed)
Dustin Wells for Infectious Disease    Date of Admission:  10/20/2021   Total days of antibiotics 7 ganciclovir          ID: Dustin Wells is a 56 y.o. male with   Principal Problem:   Encephalitis Active Problems:   Alcohol use disorder, severe, dependence (HCC)   Stage 3b chronic kidney disease (CKD) (HCC)   Atrial fibrillation, chronic (HCC)   Sepsis (HCC)   Thrombocytopenia (HCC)   HTN (hypertension)   HLD (hyperlipidemia)   Obesity with body mass index of 30.0-39.9   Testicular cancer (HCC)   Chronic diastolic CHF (congestive heart failure) (HCC)   Abdominal distention   Acute respiratory failure with hypoxia (HCC)   Seizures (HCC)    Subjective: Afebrile. No seizures overnight. Tolerated repeat LP this afternoon  Medications:   allopurinol  300 mg Oral Daily   atorvastatin  40 mg Oral Daily   clomiPHENE  25 mg Oral QHS   cyanocobalamin  1,000 mcg Oral Daily   flecainide  100 mg Oral B35H   folic acid  1 mg Oral Daily   lacosamide  100 mg Oral BID   levETIRAcetam  1,500 mg Oral BID   metoprolol succinate  25 mg Oral Daily   multivitamin with minerals  1 tablet Oral Daily   phenobarbital  97.2 mg Oral Q1200   Followed by   Derrill Memo ON 10/24/2021] phenobarbital  64.8 mg Oral Q1200   Followed by   Derrill Memo ON 10/26/2021] phenobarbital  32.4 mg Oral Q1200   Followed by   Derrill Memo ON 10/28/2021] phenobarbital  16.2 mg Oral Q1200   sodium bicarbonate  650 mg Oral BID   thiamine  100 mg Oral Daily   Or   thiamine  100 mg Intravenous Daily    Objective: Vital signs in last 24 hours: Temp:  [97.8 F (36.6 C)-98.5 F (36.9 C)] 97.8 F (36.6 C) (08/04 1158) Pulse Rate:  [69-84] 78 (08/04 1158) Resp:  [16-20] 18 (08/04 1158) BP: (123-146)/(83-91) 132/91 (08/04 1158) SpO2:  [95 %-100 %] 98 % (08/04 1158) Weight:  [115.6 kg] 115.6 kg (08/04 0800) Physical Exam  Constitutional: He is oriented to person, place, and time. He appears well-developed and well-nourished. No  distress.  HENT: EEG leads in place Mouth/Throat: Oropharynx is clear and moist. No oropharyngeal exudate.  Cardiovascular: Normal rate, regular rhythm and normal heart sounds. Exam reveals no gallop and no friction rub.  No murmur heard.  Pulmonary/Chest: Effort normal and breath sounds normal. No respiratory distress. He has no wheezes.  Abdominal: Soft. Bowel sounds are normal. He exhibits no distension. There is no tenderness.  Ext:PIV are c/d/i Neurological: He is alert and oriented to person, place, and time.  Skin: Skin is warm and dry. No rash noted. No erythema.  Psychiatric: He has a normal mood and affect. His behavior is normal.    Lab Results Recent Labs    10/21/21 0602 10/22/21 0616  WBC 7.7 6.5  HGB 12.5* 13.5  HCT 37.2* 40.8  NA 137 134*  K 4.5 4.4  CL 108 103  CO2 22 22  BUN 20 16  CREATININE 1.43* 1.48*   Liver Panel Recent Labs    10/20/21 0437 10/22/21 0616  PROT 7.3 6.6  ALBUMIN 3.3* 3.0*  AST 55* 36  ALT 69* 56*  ALKPHOS 68 74  BILITOT 1.4* 0.9  BILIDIR 0.3*  --   IBILI 1.1*  --    Sedimentation Rate Recent Labs  10/19/21 1634  ESRSEDRATE 58*   C-Reactive Protein Recent Labs    10/19/21 1634  CRP 4.7*    Microbiology: pending Studies/Results: DG FL GUIDED LUMBAR PUNCTURE  Result Date: 10/22/2021 CLINICAL DATA:  Seizures, encephalopathy, recent fevers. EXAM: LUMBAR PUNCTURE UNDER FLUOROSCOPY PROCEDURE: Due to the patient's altered mental status, Narda Rutherford, NP obtained informed consent from the patient's wife Dustin Wells) via telephone prior to the procedure. This process included a discussion of procedural risks. An appropriate skin entry site was determined under fluoroscopy and marked. A time-out was performed. The operator donned sterile gloves and a mask. The skin entry site was prepped with Betadine, draped in the usual sterile fashion and infiltrated locally with 1% lidocaine. Under fluoroscopic guidance, a 20 gauge spinal  needle was advanced into the thecal sac at the L4-L5 level. There was spontaneous return of clear CSF with an opening pressure of 20 cm water. 16 mL of CSF were collected for laboratory studies. The inner stylet was placed back in the needle and the needle was removed in its entirety. A dressing was applied to the skin entry site. The patient tolerated the procedure well. No immediate post-procedure complication was apparent. The procedure was performed by Narda Rutherford, NP, and was supervise interpreted by Kellie Simmering, D.O. FLUOROSCOPY: Fluoroscopy time: 12 seconds (1.90 mGy). IMPRESSION: Technically successful fluoroscopically-guided L4-L5 lumbar puncture. Opening pressure: 20 cm water. 16 mL of CSF obtained for laboratory studies. Electronically Signed   By: Kellie Simmering D.O.   On: 10/22/2021 14:58   Overnight EEG with video  Result Date: 10/21/2021 Lora Havens, MD     10/22/2021 10:24 AM Patient Name: Dustin Wells MRN: [id number] Epilepsy Attending: Lora Havens Referring Physician/Provider: Donnetta Simpers, MD Duration: 10/20/2021 1957 to 10/21/2021 1957  Patient history: 56 year old male with altered mental status.  EEG to evaluate for seizure.  Level of alertness: awake, asleep  AEDs during EEG study: LEV, LCM  Technical aspects: This EEG study was done with scalp electrodes positioned according to the 10-20 International system of electrode placement. Electrical activity was reviewed with band pass filter of 1-'70Hz'$ , sensitivity of 7 uV/mm, display speed of 33m/sec with a '60Hz'$  notched filter applied as appropriate. EEG data were recorded continuously and digitally stored.  Video monitoring was available and reviewed as appropriate.  Description:  The posterior dominant rhythm consists of 9-10 Hz activity of moderate voltage (25-35 uV) seen predominantly in posterior head regions, symmetric and reactive to eye opening and eye closing.  Sleep was characterized by sleep spindles (12 to 14 Hz), maximal  frontocentral region. Periodic discharges with overriding fast activity were noted in right parieto-occipital region which at times appeared rhythmic lasting 3-8 seconds consistent with brief-ictal-interictal rhythmic discharges. Hyperventilation and photic stimulation were not performed.    ABNORMALITY - Brief-ictal-interictal rhythmic discharges, right parieto-occipital region  IMPRESSION: This study showed evidence of epileptogenicity arising from right parietal occipital region with increased risk of seizure recurrence.  No definite seizures were seen during the study.  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 He had 4 days antimicrobial coverage for possible uti/pyelonephritis- which ended on 7/31. Awaiting repeat cell count. Will do repeat meningitis panel to see if still seeing HHV-6. Repeat CSF HHV-6 Quant VL. My only concern is that if he has partially treated bacterial meningitis. He has not worsened off of antibacterial agents.  His initial LP was done at 1600 and his first abtx dose was given 6hr earlier at 10am. Not sure if that is sufficient to neutralize csf cultures, but meningitis pcr panel did not detect any other pathogen.   Seizure disorder= hx of prior CVA likely predisposes him for seizure in addition to recent meds/alcohol/infection that potentially decreased seizure threshold. AED titrated to control seizures with 2 agents (aim to d/c phenobarb)    [facility] for Infectious Diseases Pager: [phone]  10/22/2021, 3:48 PM

## 2021-10-22 NOTE — Progress Notes (Signed)
PHARMACY - PHYSICIAN COMMUNICATION  CRITICAL VALUE ALERT - Meningitis / Encephalitis Panel  Dustin Wells is an 56 y.o. male who presented to [facility] on 10/20/2021 with a chief complaint of Encephalitis  Assessment: Lumbar puncture performed for concerns of meningitis / encephalitis.   Name of physician (or Provider) Contacted: Baxter Flattery  Current anti-infectives: Ganciclovir Changes to prescribed anti-infectives recommended:   Patient is on recommended anti-infectives - no changes needed  Results for orders placed or performed during the hospital encounter of 10/20/21  Meningitis/Encephalitis Panel (CSF) (Collected: 10/22/2021  1:52 PM)  Result Value Ref Range   Cryptococcus neoformans/gattii (CSF) NOT DETECTED NOT DETECTED   Cytomegalovirus (CSF) NOT DETECTED NOT DETECTED   Enterovirus (CSF) NOT DETECTED NOT DETECTED   Escherichia coli K1 (CSF) NOT DETECTED NOT DETECTED   Haemophilus influenzae (CSF) NOT DETECTED NOT DETECTED   Herpes simplex virus 1 (CSF) NOT DETECTED NOT DETECTED   Herpes simplex virus 2 (CSF) NOT DETECTED NOT DETECTED   Human herpesvirus 6 (CSF) DETECTED (A) NOT DETECTED   Human parechovirus (CSF) NOT DETECTED NOT DETECTED   Listeria monocytogenes (CSF) NOT DETECTED NOT DETECTED   Neisseria meningitis (CSF) NOT DETECTED NOT DETECTED   Streptococcus agalactiae (CSF) NOT DETECTED NOT DETECTED   Streptococcus pneumoniae (CSF) NOT DETECTED NOT DETECTED   Varicella zoster virus (CSF) NOT DETECTED NOT DETECTED   Thank you for allowing pharmacy to be a part of this patient's care.  Alycia Rossetti, PharmD, BCPS Infectious Diseases Clinical Pharmacist 10/22/2021 4:33 PM   **Pharmacist phone directory can now be found on [email or web address] ([email or web address]).  Listed under [place].

## 2021-10-22 NOTE — Progress Notes (Signed)
Subjective: No seizures overnight.  ROS: negative except above  Examination  Vital signs in last 24 hours: Temp:  [97.9 F (36.6 C)-98.5 F (36.9 C)] 98.5 F (36.9 C) (08/04 0800) Pulse Rate:  [69-84] 84 (08/04 0800) Resp:  [16-20] 18 (08/04 0800) BP: (123-146)/(83-88) 137/88 (08/04 0800) SpO2:  [95 %-100 %] 98 % (08/04 0800) Weight:  [115.6 kg] 115.6 kg (08/04 0800)  General: lying in bed, NAD Neuro: MS: Alert, oriented, follows commands CN: pupils equal and reactive,  EOMI, face symmetric, tongue midline, normal sensation over face, Motor: 5/5 strength in all 4 extremities except 5-/5 in LUE  Basic Metabolic Panel: Recent Labs  Lab 10/17/21 1020 10/19/21 0511 10/20/21 0437 10/21/21 0602 10/22/21 0616  NA 134* 138 139 137 134*  K 3.4* 4.0 4.3 4.5 4.4  CL 97* 105 108 108 103  CO2 25 26 21* 22 22  GLUCOSE 195* 92 106* 93 91  BUN 41* 33* 30* 20 16  CREATININE 1.95* 1.68* 1.59* 1.43* 1.48*  CALCIUM 8.8* 9.2 9.6 9.5 9.6  MG  --  1.9 2.1  --   --     CBC: Recent Labs  Lab 10/16/21 0723 10/19/21 1634 10/20/21 0437 10/21/21 0602 10/22/21 0616  WBC 10.9* 6.9 7.1 7.7 6.5  NEUTROABS  --  4.3  --   --  4.3  HGB 12.8* 13.0 13.6 12.5* 13.5  HCT 36.9* 39.5 41.1 37.2* 40.8  MCV 91.3 92.7 93.6 93.9 96.0  PLT 129* 229 261 237 240     Coagulation Studies: No results for input(s): "LABPROT", "INR" in the last 72 hours.  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  No definite seizures were seen during the study.    Recommendations -Start phenobarbital taper, ordered in epic -Continue Keppra 1500 mg twice daily and Vimpat 100 mg twice daily -Continue LTM EEG to look for intermittent seizures.  With stop tomorrow if remains seizure-free -Plan to repeat lumbar puncture per ID -Continue seizure precautions.  Patient does not drive due to previous DUI -As needed IV Ativan 2 mg for clinical seizure-like activity -Management of rest of comorbidities per primary team -Recommend follow-up with neurology in 3 months -Plan discussed with patient, wife at bedside as well as Dr. Bonner Puna at [facility] team via secure chat  Seizure precautions: Per [facility] statutes, patients with seizures are not allowed to drive until they have been seizure-free for six months and cleared by a physician    Use caution when using heavy equipment or power tools. Avoid working on ladders or at heights. Take showers instead of baths. Ensure the water temperature is not too high on the home water heater. Do not go swimming alone. Do not lock yourself in a room alone (i.e. bathroom). When caring for infants or small children, sit down when holding, feeding, or changing them to minimize risk of injury to the child in the event you have a seizure. Maintain good sleep hygiene. Avoid alcohol.    If patient has another seizure, call 911 and bring them back to the ED if: A.  The seizure lasts longer than 5 minutes.  B.  The patient doesn't wake shortly after the seizure or has new problems such as difficulty seeing, speaking or moving following the seizure C.  The patient was injured during the seizure D.  The patient has a temperature over 102 F (39C) E.  The patient vomited during the seizure and now is having trouble breathing    During the Seizure   - First, ensure adequate ventilation and place patients on the floor on their left side  Loosen clothing around the neck and ensure the airway is patent. If the patient is clenching the  teeth, do not force the mouth open with any object as this can cause severe damage - Remove all items from the surrounding that can be hazardous. The patient may be oblivious to what's happening and may not even know what he or she is doing. If the patient is confused and wandering, either gently guide him/her away and block access to outside areas - Reassure the individual and be comforting - Call 911. In most cases, the seizure ends before EMS arrives. However, there are cases when seizures may last over 3 to 5 minutes. Or the individual may have developed breathing difficulties or severe injuries. If a pregnant patient or a person with diabetes develops a seizure, it is prudent to call an ambulance. - Finally, if the patient does not regain full consciousness, then call EMS. Most patients will remain confused for about 45 to 90 minutes after a seizure, so you must use judgment in calling for help.    After the Seizure (Postictal Stage)   After a seizure, most patients experience confusion, fatigue, muscle pain and/or a headache. Thus, one should permit the individual to sleep. For the next few days, reassurance is essential. Being calm and helping reorient the person is also of importance.   Most seizures are painless and end spontaneously. Seizures are not harmful to others but can lead to complications such as stress on the lungs, brain and the heart. Individuals with prior lung problems may develop labored breathing and respiratory distress.    I have spent a total of 37 minutes with the patient reviewing hospital notes,  test results, labs and examining the patient as well as establishing an assessment and plan that was discussed personally with the patient.  > 50% of time was spent in direct patient care.    Zeb Comfort Epilepsy [facility] For questions after 5pm please refer to AMION to reach the Neurologist on call

## 2021-10-22 NOTE — Progress Notes (Signed)
ANTICOAGULATION CONSULT NOTE - Initial Consult  Pharmacy Consult for IV Heparin infusion Indication: atrial fibrillation  Allergies  Allergen Reactions   Rivaroxaban Diarrhea, Nausea Only and Other (See Comments)    Nausea, diarrhea, abdominal pain Nausea, diarrhea, abdominal pain Nausea, diarrhea, abdominal pain    Sulfamethoxazole-Trimethoprim Rash    Patient Measurements: Height: 6' (182.9 cm) (recorded on 10/19/21) Weight: 115.6 kg (254 lb 12.8 oz) (recorded on 10/19/21) IBW/kg (Calculated) : 77.6 Heparin Dosing Weight: 102.6 kg  Vital Signs: Temp: 97.9 F (36.6 C) (08/04 1947) Temp Source: Oral (08/04 1622) BP: 122/75 (08/04 1947) Pulse Rate: 70 (08/04 1947)  Labs: Recent Labs    10/20/21 0437 10/20/21 0724 10/20/21 1650 10/21/21 0602 10/22/21 0616 10/22/21 1907  HGB 13.6  --   --  12.5* 13.5  --   HCT 41.1  --   --  37.2* 40.8  --   PLT 261  --   --  237 240  --   APTT  --  61* 61*  --   --  35  HEPARINUNFRC  --  >1.10*  --   --   --  1.01*  CREATININE 1.59*  --   --  1.43* 1.48*  --      Estimated Creatinine Clearance: 75.8 mL/min (A) (by C-G formula based on SCr of 1.48 mg/dL (H)).   Medical History: Past Medical History:  Diagnosis Date   Atrial fibrillation (HCC)    CHF (congestive heart failure) ([place])    Hypertension    Renal disorder    Stroke [facility])    Testicular cancer (HCC)     Medications:  Medications Prior to Admission  Medication Sig Dispense Refill Last Dose   acetaminophen (TYLENOL) 325 MG tablet Take 650 mg by mouth every 6 (six) hours as needed for mild pain.   10/14/2021   allopurinol (ZYLOPRIM) 300 MG tablet Take 300 mg by mouth daily.   Past Week   amLODipine (NORVASC) 5 MG tablet Take 5 mg by mouth daily.   10/14/2021   apixaban (ELIQUIS) 5 MG TABS tablet Take 5 mg by mouth 2 (two) times daily.   10/14/2021 at 0730   atorvastatin (LIPITOR) 40 MG tablet Take 40 mg by mouth daily.   10/14/2021   Cholecalciferol 25 MCG (1000 UT)  tablet Take 1,000 Units by mouth daily.   10/14/2021   clomiPHENE (CLOMID) 50 MG tablet Take 1/2 tablet daily (Patient taking differently: Take 25 mg by mouth at bedtime. Take 1/2 tablet daily) 90 tablet 3 10/14/2021   cyanocobalamin 1000 MCG tablet Take 1,000 mcg by mouth daily.   10/14/2021   flecainide (TAMBOCOR) 100 MG tablet Take 100 mg by mouth every 12 (twelve) hours.   9/47/6546   folic acid (FOLVITE) 1 MG tablet Take by mouth.   Past Week   gabapentin (NEURONTIN) 300 MG capsule Take 1-3 tabs at night   Past Week   LORazepam (ATIVAN) 2 MG tablet Take 2 mg by mouth at bedtime as needed.   unk   losartan (COZAAR) 100 MG tablet Take 1 tablet (100 mg total) by mouth daily. Hold until you see your nephrologist   Past Month   magnesium oxide (MAG-OX) 400 (240 Mg) MG tablet Take 1 tablet by mouth 2 (two) times daily.   Past Month   metoprolol succinate (TOPROL-XL) 100 MG 24 hr tablet Take 1 tablet (100 mg total) by mouth daily. hold   Past Month at [facility], 2 MG/DOSE, 8 MG/3ML SOPN  Inject 2 mg into the skin once a week. Monday   10/11/2021   pantoprazole (PROTONIX) 40 MG tablet Take 40 mg by mouth daily as needed (acid reflux).   unk   sildenafil (REVATIO) 20 MG tablet Take 3-5 tablets 1 hr prior to intercourse as needed (Patient taking differently: Take 60-100 mg by mouth every hour as needed (Prior to intercourse). Take 3-5 tablets 1 hr prior to intercourse as needed) 60 tablet 3    sodium bicarbonate 650 MG tablet Take 650 mg by mouth 2 (two) times daily.   10/14/2021   torsemide (DEMADEX) 10 MG tablet Take 1 tablet (10 mg total) by mouth daily. Hold until you see your nephrologist   Past Month   ganciclovir 290 mg in sodium chloride 0.9 % 100 mL Inject 290 mg into the vein every 12 (twelve) hours.      Multiple Vitamin (MULTIVITAMIN WITH MINERALS) TABS tablet Take 1 tablet by mouth daily.      thiamine (VITAMIN B1) 100 MG tablet Take 1 tablet (100 mg total) by mouth daily.      thiamine  (VITAMIN B1) 100 MG/ML injection Inject 1 mL (100 mg total) into the vein daily. 25 mL     Scheduled:   allopurinol  300 mg Oral Daily   atorvastatin  40 mg Oral Daily   clomiPHENE  25 mg Oral QHS   cyanocobalamin  1,000 mcg Oral Daily   flecainide  100 mg Oral [id number]   folic acid  1 mg Oral Daily   lacosamide  100 mg Oral BID   levETIRAcetam  1,500 mg Oral BID   metoprolol succinate  25 mg Oral Daily   multivitamin with minerals  1 tablet Oral Daily   phenobarbital  97.2 mg Oral Q1200   Followed by   Derrill Memo ON 10/24/2021] phenobarbital  64.8 mg Oral Q1200   Followed by   Derrill Memo ON 10/26/2021] phenobarbital  32.4 mg Oral Q1200   Followed by   Derrill Memo ON 10/28/2021] phenobarbital  16.2 mg Oral Q1200   sodium bicarbonate  650 mg Oral BID   thiamine  100 mg Oral Daily   Or   thiamine  100 mg Intravenous Daily   Infusions:   ganciclovir (CYTOVENE) 465 mg in sodium chloride 0.9 % 100 mL IVPB 465 mg (10/22/21 1251)   heparin 1,600 Units/hr (10/22/21 1011)    Assessment: 56 y.o male on Apixaban 5 mg BID prior to Rafael Gonzalez for h/o nonvalvular atrial fibrillation.   Admitted to [facility] 7/28.  Transferred to [facility] on 8/2 for continuous EEG study due to  concern of multiple subclinical seizures.  Inpatient: 7/28 Apixaban (Eliquis) > 8/1 switched to Heparin infusion > 8/2 > back to Eliquis , resumed 8/2 PM .   Today 10/22/21 pharmacist consulted to start IV hepairn as apixaban held due to may need LP procedure.   Last dose of Apixaban given 8/3 at 22:04.     Previous heparin rate at [facility] was 1600 units/hr  (~ 16 units/kg hr,  using dosing weight 102.6 kg) which was a rate increase from 1500 units/hr which gave aPTT result of 61 seconds.  Goal aPTT =66-102 seconds.  Will start heparin drip now at 1600 units/hr.   Monitor aPTT for adjustments until heparin levels correlate with aPTT once effect of apixaban diminished.    Allergies: Fraser Din reports xarelto causing Nausea, diarrhea, abdominal pain    Goal of  Therapy:  Heparin level 0.3-0.7 units/ml aPTT 66-102 seconds Monitor platelets by  anticoagulation protocol: Yes   Plan:  Apixaban is on hold  Increase Heparin drip 1700 units/hr, no bolus  Daily aPTT, HL  (use aPTTs until HLs correlate)   Alanda Slim, PharmD, [facility] Clinical Pharmacist Please see AMION for all Pharmacists' Contact Phone Numbers 10/22/2021, 8:04 PM

## 2021-10-22 NOTE — TOC CAGE-AID Note (Signed)
Transition of Care [facility]) - CAGE-AID Screening   Patient Details  Name: Dustin Wells MRN: [id number] Date of Birth: 08-17-1965  [facility]) CM/SW Contact:    Pollie Friar, RN Phone Number: 10/22/2021, 3:03 PM   Clinical Narrative: Patient provided with resources for inpatient/ outpatient alcohol counseling.   CAGE-AID Screening:    Have You Ever Felt You Ought to Cut Down on Your Drinking or Drug Use?: Yes Have People Annoyed You By Critizing Your Drinking Or Drug Use?: Yes Have You Felt Bad Or Guilty About Your Drinking Or Drug Use?: Yes Have You Ever Had a Drink or Used Drugs First Thing In The Morning to Steady Your Nerves or to Get Rid of a Hangover?: No CAGE-AID Score: 3  Substance Abuse Education Offered: Yes  Substance abuse interventions: Patient Counseling, Scientist, clinical (histocompatibility and immunogenetics)

## 2021-10-22 NOTE — Progress Notes (Signed)
Progress Note  Patient: Dustin Wells IWP:[id number] DOB: 1965-12-30  DOA: 10/20/2021  DOS: 10/22/2021    Brief hospital course: Dustin Wells is a 56 y.o. male with a history of stage II seminoma s/p XRT, right parietal CVA, alcohol abuse, HTN, prediabetes, HLD, AFib, chronic HFpEF, obesity, OSA on CPAP, thrombocytopenia who presented initially to [facility] 7/26 with presyncope and hypotension improved with IVF, though was febrile prior to discharge, and sent home only to return 7/28 with continued back pain, neck pain, confusion, urinary incontinence, abdominal pain and distention, fever. Among other work up, CT abd/pelvis showed splenomegaly and bilateral perinephric stranding. He met sepsis criteria, started on ceftriaxone. ID was consulted, LP on 7/29 was done for the concern of meningitis, showed a neutrophilic pleocytosis (WBC 804, 70% PMNs), protein 105, and HHV-6 positive. Antibiotics were broadened, a single dose of decadron was given, and acyclovir given empirically initially. This was changed to ganciclovir, and empiric antibiotics ultimately were stopped. To date, blood and CSF cultures have remained negative. CSF cultures negative. MRI brain revealed stable encephalomalacia of right posterior temporal and parietal lobe. Neurology was consulted, noting he had several episodes per day of uncontrollable left hand spasms and started keppra. Additional AEDs added due to persistent seizure activity, prompting transfer to [facility] on 8/2 for continuous EEG. Phenobarbital has been added with planned taper. Due to diagnostic uncertainty, repeat LP is requested.  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 - HHV-6 DNA PCR confirmatory testing remains pending. D/w ID and neurology, we will request repeat LP under fluoroscopy, thus will change eliquis back to heparin this morning, last dose 8/3 PM.  - Continue  ganciclovir per pharmacy - Antibiotics have been stopped. Monitoring CSF culture (7/28, NGTD) and blood cultures (7/28 NGTD, 8/1 NGTD) - ID, Dr. Baxter Flattery consult appreciated. - Note discussion by Dr. Curly Shores in consult note 8/1 suspecting HHV-6 encephalitis is false positive, autoimmune/paraneoplastic encephalitis similarly unlikely as he improved more quickly than would be expected, even with treatments as given. The severity of the pleocytosis in CSF remains unexplained, too high to attribute to focal status epilepticus.   Sepsis: Met criteria at admission. Will further delineate as clinical data become available. Currently resolved.  Bilateral perinephric stranding: With prominent urinary symptoms PTA, though no WBCs on urinalysis. There were RBCs, so ?passed stone(s).   - ID recommended stopping antibiotics after 4 days of treatment. We will plan to continue monitoring off antibiotics. Urinary symptoms have resolved.  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  - On continuous EEG per neurology recommendations. Showing epileptogenicity.  - Continue keppra, vimpat; switch to po per neurology - Continue prn ativan - Continue seizure precautions - Given phenobarbital 8/3, taper ordered per Dr. Hortense Ramal.  Acute metabolic encephalopathy: Also likely due to seizures/post-ictal. Essentially resolved. Note RPR, ammonia, TSH wnl.  Alcohol abuse: EtOH negative at admission. - No current evidence of withdrawal.  - Cessation counseling provided.   Splenomegaly, thrombocytopenia: Chronic. Platelets have normalized over past few days.  OSA:  - CPAP qHS advised (hasn't been using) - Will follow up for reevaluation as outpatient to get new machine.   Chronic HFpEF: Stable - Was given IV lasix x1, though acute decompensation not suspected at this time. Will continue to hold torsemide and losartan (said to be on hold PTA anyway) - Restart metoprolol, confirmed by  med rec.  - BP normotensive, holding norvasc for now.  Paroxysmal atrial fibrillation: Followed by [facility] cardiology, has had multiple cardioversions, previously on amiodarone. Currently rhythm controlled. - Continue eliquis - Continue flecainide for rhythm control, currently in NSR/1st deg AVB - Continue telemetry monitoring for now.  - Metoprolol said to be used for rate control per cardiology note, though currently held. Will restart at lower dose given HR in 60-70's.  Stage II CKD: CrCl is currently 16m/min and continuing to improve, so previously cited stage IIIb no longer appears accurate.  - Avoid nephrotoxins.   History of right parietal embolic CVA: With imaging showing encephalomalacia and EEG showing focal findings.  - Remain on anticoagulation (transition back eliquis > heparin gtt for LP) - Continue statin  Elevated LFTs: No focal hepatic abnormality on CT, no biliary dilatation.  - Mild and improving.   Acute hypoxic respiratory failure: Unclear etiology, possible hypoventilation due to encephalopathy. CXR at admission showed atelectasis vs./favored over infiltrate without pulmonary edema. PE unlikely with chronic anticoagulation. This has resolved, so no further work up is currently planned.  HLD:  - Continue statin  History of seminoma: S/p radiation.  - Continue clomiphene.  Obesity: Estimated body mass index is 34.56 kg/m as calculated from the following:   Height as of this encounter: 6' (1.829 m).   Weight as of this encounter: 115.6 kg. - Since indication for ozempic is not diabetes, will hold weekly dose while admitted.  Subjective: Feeling well, slept well, eating ok. No fevers. Still has general back pain. No abd pain. No left hand spasm in past 24 hours.   Objective: Vitals:   10/21/21 1951 10/21/21 2333 10/22/21 0309 10/22/21 0800  BP: 123/84 (!) 146/83 134/88 137/88  Pulse: 79 74 69 84  Resp: _0 Temp: 98.3 F (36.8 C) 97.9 F (36.6 C)  98 F (36.7 C) 98.5 F (36.9 C)  TempSrc: Oral   Oral  SpO2: 98% 100% 96% 98%  Weight:    115.6 kg  Height:    6' (1.829 m)   Gen: 56y.o. male in no distress Pulm: Nonlabored breathing room air. Clear. CV: Regular rate and rhythm. No murmur, rub, or gallop. No JVD, no pitting dependent edema. GI: Abdomen soft, non-tender, protuberant but not taut, with normoactive bowel sounds.  Ext: Warm, no deformities Skin: No exanthem, jaundice, or ulcers on visualized skin. Neuro: Alert and oriented. Very mild left hand grip weakness, no new focal neurological deficits. No asterixis or tremor. Psych: Judgement and insight appear fair. Mood euthymic & affect congruent. Behavior is appropriate.    Data Personally reviewed: CBC: Recent Labs  Lab 10/16/21 0723 10/19/21 1634 10/20/21 0437 10/21/21 0602 10/22/21 0616  WBC 10.9* 6.9 7.1 7.7 6.5  NEUTROABS  --  4.3  --   --  4.3  HGB 12.8* 13.0 13.6 12.5* 13.5  HCT 36.9* 39.5 41.1 37.2* 40.8  MCV 91.3 92.7 93.6 93.9 96.0  PLT 129* 229 261 237 2546  Basic Metabolic Panel: Recent Labs  Lab 10/17/21 1020 10/19/21 0511 10/20/21 0437 10/21/21 0602 10/22/21 0616  NA 134* 138 139 137 134*  K 3.4* 4.0 4.3 4.5 4.4  CL 97* 105 108 108 103  CO2 25 26 21* 22 22  GLUCOSE 195* 92 106* 93 91  BUN 41* 33* 30* 20 16  CREATININE 1.95* 1.68* 1.59* 1.43* 1.48*  CALCIUM 8.8* 9.2 9.6 9.5 9.6  MG  --  1.9 2.1  --   --    GFR: Estimated Creatinine Clearance: 75.8 mL/min (  A) (by C-G formula based on SCr of 1.48 mg/dL (H)). Liver Function Tests: Recent Labs  Lab 10/20/21 0437 10/22/21 0616  AST 55* 36  ALT 69* 56*  ALKPHOS 68 74  BILITOT 1.4* 0.9  PROT 7.3 6.6  ALBUMIN 3.3* 3.0*   No results for input(s): "LIPASE", "AMYLASE" in the last 168 hours.  Recent Labs  Lab 10/15/21 2002  AMMONIA 32   Coagulation Profile: Recent Labs  Lab 10/15/21 2033  INR 1.5*   Cardiac Enzymes: No results for input(s): "CKTOTAL", "CKMB", "CKMBINDEX",  "TROPONINI" in the last 168 hours. BNP (last 3 results) No results for input(s): "PROBNP" in the last 8760 hours. HbA1C: Recent Labs    10/22/21 0616  HGBA1C 5.8*   CBG: Recent Labs  Lab 10/19/21 1656 10/19/21 2016 10/20/21 0828 10/20/21 1126 10/20/21 1746  GLUCAP 108* 84 89 83 109*   Lipid Profile: No results for input(s): "CHOL", "HDL", "LDLCALC", "TRIG", "CHOLHDL", "LDLDIRECT" in the last 72 hours. Thyroid Function Tests: No results for input(s): "TSH", "T4TOTAL", "FREET4", "T3FREE", "THYROIDAB" in the last 72 hours. Anemia Panel: No results for input(s): "VITAMINB12", "FOLATE", "FERRITIN", "TIBC", "IRON", "RETICCTPCT" in the last 72 hours. Urine analysis:    Component Value Date/Time   COLORURINE YELLOW (A) 10/15/2021 1221   APPEARANCEUR CLEAR (A) 10/15/2021 1221   APPEARANCEUR Clear 09/29/2021 0912   LABSPEC 1.012 10/15/2021 1221   PHURINE 6.0 10/15/2021 1221   GLUCOSEU 50 (A) 10/15/2021 1221   HGBUR MODERATE (A) 10/15/2021 1221   BILIRUBINUR NEGATIVE 10/15/2021 1221   BILIRUBINUR Negative 09/29/2021 0912   KETONESUR NEGATIVE 10/15/2021 1221   PROTEINUR 30 (A) 10/15/2021 1221   NITRITE NEGATIVE 10/15/2021 1221   LEUKOCYTESUR NEGATIVE 10/15/2021 1221   Recent Results (from the past 240 hour(s))  SARS Coronavirus 2 by RT PCR (hospital order, performed in [facility] hospital lab) *cepheid single result test* Urine, Clean Catch     Status: None   Collection Time: 10/13/21  4:29 PM   Specimen: Urine, Clean Catch; Nasal Swab  Result Value Ref Range Status   SARS Coronavirus 2 by RT PCR NEGATIVE NEGATIVE Final    Comment: (NOTE) SARS-CoV-2 target nucleic acids are NOT DETECTED.  The SARS-CoV-2 RNA is generally detectable in upper and lower respiratory specimens during the acute phase of infection. The lowest concentration of SARS-CoV-2 viral copies this assay can detect is 250 copies / mL. A negative result does not preclude SARS-CoV-2 infection and should not be  used as the sole basis for treatment or other patient management decisions.  A negative result may occur with improper specimen collection / handling, submission of specimen other than nasopharyngeal swab, presence of viral mutation(s) within the areas targeted by this assay, and inadequate number of viral copies (<250 copies / mL). A negative result must be combined with clinical observations, patient history, and epidemiological information.  Fact Sheet for Patients:   https://www.patel.info/  Fact Sheet for Healthcare Providers: https://hall.com/  This test is not yet approved or  cleared by the [place] FDA and has been authorized for detection and/or diagnosis of SARS-CoV-2 by FDA under an Emergency Use Authorization (EUA).  This EUA will remain in effect (meaning this test can be used) for the duration of the COVID-19 declaration under Section 564(b)(1) of the Act, 21 U.S.C. section 360bbb-3(b)(1), unless the authorization is terminated or revoked sooner.  Performed at [facility], [address] [place], [place] [address]   Resp Panel by RT-PCR (Flu A&B, Covid) Anterior Nasal Swab  Status: None   Collection Time: 10/15/21 10:23 AM   Specimen: Anterior Nasal Swab  Result Value Ref Range Status   SARS Coronavirus 2 by RT PCR NEGATIVE NEGATIVE Final    Comment: (NOTE) SARS-CoV-2 target nucleic acids are NOT DETECTED.  The SARS-CoV-2 RNA is generally detectable in upper respiratory specimens during the acute phase of infection. The lowest concentration of SARS-CoV-2 viral copies this assay can detect is 138 copies/mL. A negative result does not preclude SARS-Cov-2 infection and should not be used as the sole basis for treatment or other patient management decisions. A negative result may occur with  improper specimen collection/handling, submission of specimen other than nasopharyngeal swab, presence of viral  mutation(s) within the areas targeted by this assay, and inadequate number of viral copies(<138 copies/mL). A negative result must be combined with clinical observations, patient history, and epidemiological information. The expected result is Negative.  Fact Sheet for Patients:  EntrepreneurPulse.com.au  Fact Sheet for Healthcare Providers:  IncredibleEmployment.be  This test is no t yet approved or cleared by the [place] FDA and  has been authorized for detection and/or diagnosis of SARS-CoV-2 by FDA under an Emergency Use Authorization (EUA). This EUA will remain  in effect (meaning this test can be used) for the duration of the COVID-19 declaration under Section 564(b)(1) of the Act, 21 U.S.C.section 360bbb-3(b)(1), unless the authorization is terminated  or revoked sooner.       Influenza A by PCR NEGATIVE NEGATIVE Final   Influenza B by PCR NEGATIVE NEGATIVE Final    Comment: (NOTE) The Xpert Xpress SARS-CoV-2/FLU/RSV plus assay is intended as an aid in the diagnosis of influenza from Nasopharyngeal swab specimens and should not be used as a sole basis for treatment. Nasal washings and aspirates are unacceptable for Xpert Xpress SARS-CoV-2/FLU/RSV testing.  Fact Sheet for Patients: EntrepreneurPulse.com.au  Fact Sheet for Healthcare Providers: IncredibleEmployment.be  This test is not yet approved or cleared by the [place] FDA and has been authorized for detection and/or diagnosis of SARS-CoV-2 by FDA under an Emergency Use Authorization (EUA). This EUA will remain in effect (meaning this test can be used) for the duration of the COVID-19 declaration under Section 564(b)(1) of the Act, 21 U.S.C. section 360bbb-3(b)(1), unless the authorization is terminated or revoked.  Performed at [facility], [address] [place], [place] [address]   Blood Culture (routine x 2)      Status: None   Collection Time: 10/15/21 10:23 AM   Specimen: BLOOD  Result Value Ref Range Status   Specimen Description BLOOD LEFT ANTECUBITAL  Final   Special Requests   Final    BOTTLES DRAWN AEROBIC AND ANAEROBIC Blood Culture adequate volume   Culture   Final    NO GROWTH 5 DAYS Performed at [facility], [address] [place], [place] [address]    Report Status 10/20/2021 FINAL  Final  Urine Culture     Status: None   Collection Time: 10/15/21 12:21 PM   Specimen: In/Out Cath Urine  Result Value Ref Range Status   Specimen Description   Final    IN/OUT CATH URINE Performed at [facility], [address] [place], [place] [address]    Special Requests   Final    NONE Performed at [facility], [address], [place], [place] [address]    Culture   Final    NO GROWTH Performed at [facility], [place] [address] [place],  [address]  Report Status 10/16/2021 FINAL  Final  CSF culture w Gram Stain     Status: None   Collection Time: 10/15/21  4:57 PM   Specimen: CSF; Cerebrospinal Fluid  Result Value Ref Range Status   Specimen Description   Final    CSF Performed at [facility], [address] [place], [place] [address]    Special Requests   Final    NONE Performed at [facility], [address] [place], [place] [address]    Gram Stain   Final    WBC SEEN RED BLOOD CELLS PRESENT NO ORGANISMS SEEN Performed at [facility], [address] [place], [place] [address]    Culture   Final    NO GROWTH 3 DAYS Performed at [facility], [place] [address] [place], [place] [address]    Report Status 10/19/2021 FINAL  Final  Culture, fungus without smear     Status: None (Preliminary result)   Collection Time: 10/15/21  4:58 PM   Specimen: CSF; Cerebrospinal Fluid  Result Value Ref Range Status   Specimen Description   Final    CSF Performed at [facility], [address] [place], [place] [address]    Special Requests   Final    NONE Performed at [facility], [address], [place], [place] [address]    Culture   Final    NO FUNGUS ISOLATED AFTER 7 DAYS Performed at [facility], [place] [address], [place], [place] [address]    Report Status PENDING  Incomplete  Culture, blood (Routine X 2) w Reflex to ID Panel     Status: None   Collection Time: 10/17/21  6:01 PM   Specimen: BLOOD LEFT ARM  Result Value Ref Range Status   Specimen Description BLOOD LEFT ARM  Final   Special Requests   Final    BOTTLES DRAWN AEROBIC AND ANAEROBIC BACTEROIDES CACCAE   Culture   Final    NO GROWTH 5 DAYS Performed at [facility], [address] [place], [place] [address]    Report Status 10/22/2021 FINAL  Final  Culture, blood (Routine X 2) w Reflex to ID Panel     Status: None (Preliminary result)   Collection Time: 10/19/21  5:11 AM   Specimen: BLOOD  Result Value Ref Range Status   Specimen Description BLOOD RIGHT [facility]  Final   Special Requests   Final    BOTTLES DRAWN AEROBIC AND ANAEROBIC Blood Culture adequate volume   Culture   Final    NO GROWTH 3 DAYS Performed at [facility], [address], [place], [place] [address]    Report Status PENDING  Incomplete  Culture, blood (Routine X 2) w Reflex to ID Panel     Status: None (Preliminary result)   Collection Time: 10/19/21  5:11 AM   Specimen: BLOOD  Result Value Ref Range Status   Specimen Description BLOOD RIGHT HAND  Final   Special Requests   Final    BOTTLES DRAWN AEROBIC AND ANAEROBIC Blood Culture results may not be optimal due to an inadequate volume of blood received in culture bottles   Culture   Final    NO GROWTH 3 DAYS Performed at [facility], [address] [place], [place] [address]    Report Status PENDING  Incomplete     Overnight EEG with video  Result Date: 10/21/2021 Lora Havens, MD     10/22/2021 10:24  AM Patient Name:  Juron Vorhees MRN: [id number] Epilepsy Attending: Lora Havens Referring Physician/Provider: Donnetta Simpers, MD Duration: 10/20/2021 1957 to 10/21/2021 1957  Patient history: 56 year old male with altered mental status.  EEG to evaluate for seizure.  Level of alertness: awake, asleep  AEDs during EEG study: LEV, LCM  Technical aspects: This EEG study was done with scalp electrodes positioned according to the 10-20 International system of electrode placement. Electrical activity was reviewed with band pass filter of 1-_0 , sensitivity of 7 uV/mm, display speed of 40m/sec with a _1  notched filter applied as appropriate. EEG data were recorded continuously and digitally stored.  Video monitoring was available and reviewed as appropriate.  Description:  The posterior dominant rhythm consists of 9-10 Hz activity of moderate voltage (25-35 uV) seen predominantly in posterior head regions, symmetric and reactive to eye opening and eye closing.  Sleep was characterized by sleep spindles (12 to 14 Hz), maximal frontocentral region. Periodic discharges with overriding fast activity were noted in right parieto-occipital region which at times appeared rhythmic lasting 3-8 seconds consistent with brief-ictal-interictal rhythmic discharges. Hyperventilation and photic stimulation were not performed.    ABNORMALITY - Brief-ictal-interictal rhythmic discharges, right parieto-occipital region  IMPRESSION: This study showed evidence of epileptogenicity arising from right parietal occipital region with increased risk of seizure recurrence.  No definite seizures were seen during the study.  PLora Havens  EEG adult  Result Date: 10/20/2021 YLora Havens MD     10/20/2021  4:26 PM Patient Name: JFrederick MarroMRN: [id number] Attending: PLora HavensReferring Physician/Provider: BLorenza Chick MD Date: 10/20/2021 Duration: 21.57 mins  Patient history: 56year old male with altered mental status.  EEG to evaluate  for seizure.  Level of alertness: Awake, asleep  AEDs during EEG study: LEV  Technical aspects: This EEG study was done with scalp electrodes positioned according to the 10-20 International system of electrode placement. Electrical activity was reviewed with band pass filter of 1-_2 , sensitivity of 7 uV/mm, display speed of 359msec with a _3  notched filter applied as appropriate. EEG data were recorded continuously and digitally stored.  Video monitoring was available and reviewed as appropriate.  Description: The posterior dominant rhythm consists of 9-10 Hz activity of moderate voltage (25-35 uV) seen predominantly in posterior head regions, symmetric and reactive to eye opening and eye closing.  Sleep was characterized by sleep spindles (12 to 14 Hz), maximal frontocentral region.  Periodic discharges with overriding fast activity were noted in right parieto-occipital region at _4  which at times appeared rhythmic lasting 3-9 seconds consistent with brief-ictal-interictal rhythmic discharges. Five electrographic seizures were also noted during which EEG showed evolution in morphology, frequency and involved vertex region. No clinical signs were noted. Average duration 10-15 seconds.  Last seizure was at 1102. Hyperventilation and photic stimulation were not performed.    ABNORMALITY - Electrographic seizure, right parieto-occipital region - Brief-ictal-interictal rhythmic discharges, right parieto-occipital region - Periodic discharges with overriding fast activity, right parieto-occipital regio  IMPRESSION: This study showed five electrographic seizures arising from right parieto-occipital region, lasting about 10-15 seconds each. There is also evidence of epileptogenicity with increased risk of seizure recurrence.   Dr. BhCurly Shoresas notified. PrLora Havens  Family Communication: None at bedside  Disposition: Status is: Inpatient Remains inpatient appropriate because: Severity of illness,  continuous EEG, need for repeat LP. Planned Discharge Destination:  TBD  RyPatrecia PourMD 10/22/2021 11:33 AM Page by amShea Evansom

## 2021-10-22 NOTE — Procedures (Signed)
Patient Name: Dustin Wells  MRN: [id number]  Epilepsy Attending: Lora Havens  Referring Physician/Provider: Donnetta Simpers, MD  Duration: 10/21/2021 1957 to 10/22/2021 1957   Patient history: 56 year old male with altered mental status.  EEG to evaluate for seizure.   Level of alertness: awake, asleep   AEDs during EEG study: LEV, LCM, Phenobarb   Technical aspects: This EEG study was done with scalp electrodes positioned according to the 10-20 International system of electrode placement. Electrical activity was reviewed with band pass filter of 1-'70Hz'$ , sensitivity of 7 uV/mm, display speed of 63m/sec with a '60Hz'$  notched filter applied as appropriate. EEG data were recorded continuously and digitally stored.  Video monitoring was available and reviewed as appropriate.   Description:  The posterior dominant rhythm consists of 9-10 Hz activity of moderate voltage (25-35 uV) seen predominantly in posterior head regions, symmetric and reactive to eye opening and eye closing.  Sleep was characterized by sleep spindles (12 to 14 Hz), maximal frontocentral region.  Abundant polyspikes were noted in right parieto-occipital region which at times appeared quasiperiodic at 0.25 to 1 Hz. Hyperventilation and photic stimulation were not performed.      ABNORMALITY -Polyspikes, right parieto-occipital region   IMPRESSION: This study showed evidence of epileptogenicity arising from right parietal occipital region.  No definite seizures were seen during the study.  EEG appears to be improving compared to previous day.   [name] OBarbra Sarks

## 2021-10-22 NOTE — Procedures (Signed)
Technically successful fluoro guided LP at L4/L5 level with opening pressure of 20 cm H2O. No closing pressure obtained. 16 cc of clear, colorless CSF sent to lab for analysis.  No immediate post procedural complication.  Please see imaging section of Epic for full dictation.    Narda Rutherford, AGNP-BC 10/22/2021, 1:53 PM

## 2021-10-22 NOTE — Progress Notes (Deleted)
Performed                                        . . . . . . . . . . . . . . . . . . . . . . . . . . . . . . . . . . . . . . . . . . . . . . . . . . . . . . . . . . . . . . . . . . . . . . . . . . . . . . . . . . . . . . . . . . . . . . . . . . . . . . . . . . . . . . . . . . . . . . . . . . . . . . . . . . . . . . . . . . . . . . . . . . . . . . . . . . . . . . .  . . . . . . . . . . . . . . . . . . . . . . . . . . . . . . . . . . . . . . . . . . . . . . . . . . . . . . . . . . . . . . . . . . . . . . . . . . . . . . . . . . . . . . . . . . . . . . . . . . . . . . . . . . . . . . . . . . . . . . . . . . . . . . . . . . . . . . . . . . . . . . . . . . . . . . . . . Marland KitchenPerformed maintenance on A2, F7, and FP1.  All uner 10.  No visible skin breakdown. . . . . . . . . . . . . . . Marland Kitchen

## 2021-10-22 NOTE — Progress Notes (Signed)
ANTICOAGULATION CONSULT NOTE - Initial Consult  Pharmacy Consult for IV Heparin infusion Indication: atrial fibrillation  Allergies  Allergen Reactions   Rivaroxaban Diarrhea, Nausea Only and Other (See Comments)    Nausea, diarrhea, abdominal pain Nausea, diarrhea, abdominal pain Nausea, diarrhea, abdominal pain    Sulfamethoxazole-Trimethoprim Rash    Patient Measurements: Height: 6' (182.9 cm) (recorded on 10/19/21) Weight: 115.6 kg (254 lb 12.8 oz) (recorded on 10/19/21) IBW/kg (Calculated) : 77.6 Heparin Dosing Weight: 102.6 kg  Vital Signs: Temp: 98 F (36.7 C) (08/04 0309) BP: 134/88 (08/04 0309) Pulse Rate: 69 (08/04 0309)  Labs: Recent Labs    10/19/21 1748 10/19/21 2339 10/20/21 0437 10/20/21 0724 10/20/21 1650 10/21/21 0602 10/22/21 0616  HGB  --   --  13.6  --   --  12.5* 13.5  HCT  --   --  41.1  --   --  37.2* 40.8  PLT  --   --  261  --   --  237 240  APTT 34 115*  --  61* 61*  --   --   HEPARINUNFRC >1.10*  --   --  >1.10*  --   --   --   CREATININE  --   --  1.59*  --   --  1.43* 1.48*    Estimated Creatinine Clearance: 75.8 mL/min (A) (by C-G formula based on SCr of 1.48 mg/dL (H)).   Medical History: Past Medical History:  Diagnosis Date   Atrial fibrillation (HCC)    CHF (congestive heart failure) ([place])    Hypertension    Renal disorder    Stroke [facility])    Testicular cancer (HCC)     Medications:  Medications Prior to Admission  Medication Sig Dispense Refill Last Dose   acetaminophen (TYLENOL) 325 MG tablet Take 650 mg by mouth every 6 (six) hours as needed for mild pain.   10/14/2021   allopurinol (ZYLOPRIM) 300 MG tablet Take 300 mg by mouth daily.   Past Week   amLODipine (NORVASC) 5 MG tablet Take 5 mg by mouth daily.   10/14/2021   apixaban (ELIQUIS) 5 MG TABS tablet Take 5 mg by mouth 2 (two) times daily.   10/14/2021 at 0730   atorvastatin (LIPITOR) 40 MG tablet Take 40 mg by mouth daily.   10/14/2021   Cholecalciferol 25 MCG (1000  UT) tablet Take 1,000 Units by mouth daily.   10/14/2021   clomiPHENE (CLOMID) 50 MG tablet Take 1/2 tablet daily (Patient taking differently: Take 25 mg by mouth at bedtime. Take 1/2 tablet daily) 90 tablet 3 10/14/2021   cyanocobalamin 1000 MCG tablet Take 1,000 mcg by mouth daily.   10/14/2021   flecainide (TAMBOCOR) 100 MG tablet Take 100 mg by mouth every 12 (twelve) hours.   5/80/9983   folic acid (FOLVITE) 1 MG tablet Take by mouth.   Past Week   gabapentin (NEURONTIN) 300 MG capsule Take 1-3 tabs at night   Past Week   LORazepam (ATIVAN) 2 MG tablet Take 2 mg by mouth at bedtime as needed.   unk   losartan (COZAAR) 100 MG tablet Take 1 tablet (100 mg total) by mouth daily. Hold until you see your nephrologist   Past Month   magnesium oxide (MAG-OX) 400 (240 Mg) MG tablet Take 1 tablet by mouth 2 (two) times daily.   Past Month   metoprolol succinate (TOPROL-XL) 100 MG 24 hr tablet Take 1 tablet (100 mg total) by mouth daily. hold  Past Month at [facility], 2 MG/DOSE, 8 MG/3ML SOPN Inject 2 mg into the skin once a week. Monday   10/11/2021   pantoprazole (PROTONIX) 40 MG tablet Take 40 mg by mouth daily as needed (acid reflux).   unk   sildenafil (REVATIO) 20 MG tablet Take 3-5 tablets 1 hr prior to intercourse as needed (Patient taking differently: Take 60-100 mg by mouth every hour as needed (Prior to intercourse). Take 3-5 tablets 1 hr prior to intercourse as needed) 60 tablet 3    sodium bicarbonate 650 MG tablet Take 650 mg by mouth 2 (two) times daily.   10/14/2021   torsemide (DEMADEX) 10 MG tablet Take 1 tablet (10 mg total) by mouth daily. Hold until you see your nephrologist   Past Month   ganciclovir 290 mg in sodium chloride 0.9 % 100 mL Inject 290 mg into the vein every 12 (twelve) hours.      Multiple Vitamin (MULTIVITAMIN WITH MINERALS) TABS tablet Take 1 tablet by mouth daily.      thiamine (VITAMIN B1) 100 MG tablet Take 1 tablet (100 mg total) by mouth daily.      thiamine  (VITAMIN B1) 100 MG/ML injection Inject 1 mL (100 mg total) into the vein daily. 25 mL     Scheduled:   atorvastatin  40 mg Oral Daily   clomiPHENE  25 mg Oral QHS   flecainide  100 mg Oral Y69S   folic acid  1 mg Oral Daily   lacosamide  100 mg Oral BID   levETIRAcetam  1,500 mg Oral BID   multivitamin with minerals  1 tablet Oral Daily   sodium bicarbonate  650 mg Oral BID   thiamine  100 mg Oral Daily   Or   thiamine  100 mg Intravenous Daily   Infusions:   ganciclovir (CYTOVENE) 465 mg in sodium chloride 0.9 % 100 mL IVPB 465 mg (10/21/21 2307)   heparin      Assessment: 56 y.o male on Apixaban 5 mg BID prior to [place] for h/o nonvalvular atrial fibrillation.   Admitted to [facility] 7/28.  Transferred to [facility] on 8/2 for continuous EEG study due to  concern of multiple subclinical seizures.  Inpatient: 7/28 Apixaban (Eliquis) > 8/1 switched to Heparin infusion > 8/2 > back to Eliquis , resumed 8/2 PM .   Today 10/22/21 pharmacist consulted to start IV hepairn as apixaban held due to may need LP procedure.   Last dose of Apixaban given 8/3 at 22:04.     Previous heparin rate at [facility] was 1600 units/hr  (~ 16 units/kg hr,  using dosing weight 102.6 kg) which was a rate increase from 1500 units/hr which gave aPTT result of 61 seconds.  Goal aPTT =66-102 seconds.  Will start heparin drip now at 1600 units/hr.   Monitor aPTT for adjustments until heparin levels correlate with aPTT once effect of apixaban diminished.    Allergies: Pat reports xarelto causing Nausea, diarrhea, abdominal pain    Goal of Therapy:  Heparin level 0.3-0.7 units/ml aPTT 66-102 seconds Monitor platelets by anticoagulation protocol: Yes   Plan:  Apixaban is on hold  Start Heparin drip 1600 units/hr, no bolus  Check 6hr aPTT, HL  (use aPTTs until HLs correlate) Daily aPTT, HL    Nicole Cella, [place] Clinical Pharmacist [phone] 10/22/2021,8:58 AM Please check AMION for all [place] phone numbers After 10:00  PM, call [place] [phone]

## 2021-10-22 NOTE — Plan of Care (Signed)

## 2021-10-22 NOTE — Progress Notes (Signed)
Lab called back with a critical CSF WBC count of 32.  Notified Dr Bonner Puna. Pt resting in bed with spouse at his side.  No complaints at this time.

## 2021-10-22 NOTE — Progress Notes (Addendum)
EEG maint complete. Fixed A2 , F7, and FP1.  All under 10.  No visible skin breakdown.

## 2021-10-23 LAB — COMPREHENSIVE METABOLIC PANEL
ALT: 50 U/L — ABNORMAL HIGH (ref 0–44)
AST: 33 U/L (ref 15–41)
Albumin: 3 g/dL — ABNORMAL LOW (ref 3.5–5.0)
Alkaline Phosphatase: 84 U/L (ref 38–126)
Anion gap: 7 (ref 5–15)
BUN: 17 mg/dL (ref 6–20)
CO2: 23 mmol/L (ref 22–32)
Calcium: 9.4 mg/dL (ref 8.9–10.3)
Chloride: 105 mmol/L (ref 98–111)
Creatinine, Ser: 1.39 mg/dL — ABNORMAL HIGH (ref 0.61–1.24)
GFR, Estimated: >60 mL/min (ref >60)
Glucose, Bld: 96 mg/dL (ref 70–99)
Potassium: 4.1 mmol/L (ref 3.5–5.1)
Sodium: 135 mmol/L (ref 135–145)
Total Bilirubin: 0.8 mg/dL (ref 0.3–1.2)
Total Protein: 6.3 g/dL — ABNORMAL LOW (ref 6.5–8.1)

## 2021-10-23 LAB — CBC
HCT: 38.5 % — ABNORMAL LOW (ref 39.0–52.0)
Hemoglobin: 12.8 g/dL — ABNORMAL LOW (ref 13.0–17.0)
MCH: 31.4 pg (ref 26.0–34.0)
MCHC: 33.2 g/dL (ref 30.0–36.0)
MCV: 94.6 fL (ref 80.0–100.0)
Platelets: 294 10*3/uL (ref 150–400)
RBC: 4.07 MIL/uL — ABNORMAL LOW (ref 4.22–5.81)
RDW: 14.1 % (ref 11.5–15.5)
WBC: 5.8 10*3/uL (ref 4.0–10.5)
nRBC: 0 % (ref 0.0–0.2)

## 2021-10-23 LAB — APTT: aPTT: 57 seconds — ABNORMAL HIGH (ref 24–36)

## 2021-10-23 LAB — VITAMIN B12: Vitamin B-12: 2486 pg/mL — ABNORMAL HIGH (ref 180–914)

## 2021-10-23 LAB — HEPARIN LEVEL (UNFRACTIONATED): Heparin Unfractionated: 1.02 IU/mL — ABNORMAL HIGH (ref 0.30–0.70)

## 2021-10-23 MED ORDER — APIXABAN 5 MG PO TABS
5.0000 mg | ORAL_TABLET | Freq: Two times a day (BID) | ORAL | Status: DC
  Administered 2021-10-23 – 2021-10-25 (×5): 5 mg via ORAL
  Filled 2021-10-23 (×5): qty 1

## 2021-10-23 NOTE — Progress Notes (Signed)
ANTICOAGULATION CONSULT NOTE - Follow Up Consult  Pharmacy Consult for heparin Indication: atrial fibrillation  Labs: Recent Labs    10/20/21 0724 10/20/21 1650 10/21/21 0602 10/21/21 0602 10/22/21 0616 10/22/21 1907 10/23/21 0244  HGB  --   --  12.5*   < > 13.5  --  12.8*  HCT  --   --  37.2*  --  40.8  --  38.5*  PLT  --   --  237  --  240  --  294  APTT 61* 61*  --   --   --  35 57*  HEPARINUNFRC >1.10*  --   --   --   --  1.01* 1.02*  CREATININE  --   --  1.43*  --  1.48*  --  1.39*   < > = values in this interval not displayed.    Assessment: 56yo male remains subtherapeutic on heparin after rate change; no infusion issues or signs of bleeding per RN.  Goal of Therapy:  aPTT 66-102 seconds   Plan:  Will increase heparin infusion by 2 units/kg/hr to 1900 units/hr and check level in 6 hours.    Wynona Neat, PharmD, BCPS  10/23/2021,5:19 AM

## 2021-10-23 NOTE — Progress Notes (Addendum)
[facility] for Infectious Disease    Date of Admission:  10/20/2021   Total days of antibiotics 8   ID: Dustin Wells is a 56 y.o. male with viral meningitis and new onset seizures Principal Problem:   Encephalitis Active Problems:   Alcohol use disorder, severe, dependence (HCC)   Stage 3b chronic kidney disease (CKD) (HCC)   Atrial fibrillation, chronic (HCC)   Sepsis (HCC)   Thrombocytopenia (HCC)   HTN (hypertension)   HLD (hyperlipidemia)   Obesity with body mass index of 30.0-39.9   Testicular cancer (HCC)   Chronic diastolic CHF (congestive heart failure) (HCC)   Abdominal distention   Acute respiratory failure with hypoxia (HCC)   Seizure (HCC)    Subjective: Afebrile. Still on EEG monitoring. He underwent repeat LP yesterday afternoon, repeat cell count shows improvement in now down to 32 (From 800), lymphocytic predominance and HHV-6+ identified  EEG leads removed this afternoon  Medications:   allopurinol  300 mg Oral Daily   apixaban  5 mg Oral BID   atorvastatin  40 mg Oral Daily   clomiPHENE  25 mg Oral QHS   cyanocobalamin  1,000 mcg Oral Daily   flecainide  100 mg Oral I50Y   folic acid  1 mg Oral Daily   lacosamide  100 mg Oral BID   levETIRAcetam  1,500 mg Oral BID   metoprolol succinate  25 mg Oral Daily   multivitamin with minerals  1 tablet Oral Daily   phenobarbital  97.2 mg Oral Q1200   Followed by   Derrill Memo ON 10/24/2021] phenobarbital  64.8 mg Oral Q1200   Followed by   Derrill Memo ON 10/26/2021] phenobarbital  32.4 mg Oral Q1200   Followed by   Derrill Memo ON 10/28/2021] phenobarbital  16.2 mg Oral Q1200   sodium bicarbonate  650 mg Oral BID   thiamine  100 mg Oral Daily   Or   thiamine  100 mg Intravenous Daily    Objective: Vital signs in last 24 hours: Temp:  [97.5 F (36.4 C)-98.6 F (37 C)] 98.6 F (37 C) (08/05 0734) Pulse Rate:  [65-78] 73 (08/05 0734) Resp:  [16-20] 16 (08/05 0734) BP: (110-154)/(72-100) 118/81 (08/05 0734) SpO2:   [95 %-98 %] 97 % (08/05 0734) Physical Exam  Constitutional: He is oriented to person, place, and time. He appears well-developed and well-nourished. No distress.  HENT:  Mouth/Throat: Oropharynx is clear and moist. No oropharyngeal exudate.  Cardiovascular: Normal rate, regular rhythm and normal heart sounds. Exam reveals no gallop and no friction rub.  No murmur heard.  Pulmonary/Chest: Effort normal and breath sounds normal. No respiratory distress. He has no wheezes.  Abdominal: Soft. Bowel sounds are normal. He exhibits no distension. There is no tenderness.  Lymphadenopathy:  He has no cervical adenopathy.  Neurological: He is alert and oriented to person, place, and time.  Skin: Skin is warm and dry. No rash noted. No erythema.  Psychiatric: He has a normal mood and affect. His behavior is normal.     Lab Results Recent Labs    10/22/21 0616 10/23/21 0244  WBC 6.5 5.8  HGB 13.5 12.8*  HCT 40.8 38.5*  NA 134* 135  K 4.4 4.1  CL 103 105  CO2 22 23  BUN 16 17  CREATININE 1.48* 1.39*   Liver Panel Recent Labs    10/22/21 0616 10/23/21 0244  PROT 6.6 6.3*  ALBUMIN 3.0* 3.0*  AST 36 33  ALT 56* 50*  ALKPHOS  74 84  BILITOT 0.9 0.8    Microbiology: Csf gram stain only wbc. Cultures pending Studies/Results: DG FL GUIDED LUMBAR PUNCTURE  Result Date: 10/22/2021 CLINICAL DATA:  Seizures, encephalopathy, recent fevers. EXAM: LUMBAR PUNCTURE UNDER FLUOROSCOPY PROCEDURE: Due to the patient's altered mental status, Narda Rutherford, NP obtained informed consent from the patient's wife Dustin Wells) via telephone prior to the procedure. This process included a discussion of procedural risks. An appropriate skin entry site was determined under fluoroscopy and marked. A time-out was performed. The operator donned sterile gloves and a mask. The skin entry site was prepped with Betadine, draped in the usual sterile fashion and infiltrated locally with 1% lidocaine. Under fluoroscopic  guidance, a 20 gauge spinal needle was advanced into the thecal sac at the L4-L5 level. There was spontaneous return of clear CSF with an opening pressure of 20 cm water. 16 mL of CSF were collected for laboratory studies. The inner stylet was placed back in the needle and the needle was removed in its entirety. A dressing was applied to the skin entry site. The patient tolerated the procedure well. No immediate post-procedure complication was apparent. The procedure was performed by Narda Rutherford, NP, and was supervise interpreted by Kellie Simmering, D.O. FLUOROSCOPY: Fluoroscopy time: 12 seconds (1.90 mGy). IMPRESSION: Technically successful fluoroscopically-guided L4-L5 lumbar puncture. Opening pressure: 20 cm water. 16 mL of CSF obtained for laboratory studies. Electronically Signed   By: Kellie Simmering D.O.   On: 10/22/2021 14:58   Overnight EEG with video  Result Date: 10/21/2021 Lora Havens, MD     10/22/2021 10:24 AM Patient Name: Dustin Wells MRN: [id number] Epilepsy Attending: Lora Havens Referring Physician/Provider: Donnetta Simpers, MD Duration: 10/20/2021 1957 to 10/21/2021 1957  Patient history: 56 year old male with altered mental status.  EEG to evaluate for seizure.  Level of alertness: awake, asleep  AEDs during EEG study: LEV, LCM  Technical aspects: This EEG study was done with scalp electrodes positioned according to the 10-20 International system of electrode placement. Electrical activity was reviewed with band pass filter of 1-'70Hz'$ , sensitivity of 7 uV/mm, display speed of 68m/sec with a '60Hz'$  notched filter applied as appropriate. EEG data were recorded continuously and digitally stored.  Video monitoring was available and reviewed as appropriate.  Description:  The posterior dominant rhythm consists of 9-10 Hz activity of moderate voltage (25-35 uV) seen predominantly in posterior head regions, symmetric and reactive to eye opening and eye closing.  Sleep was characterized by sleep spindles  (12 to 14 Hz), maximal frontocentral region. Periodic discharges with overriding fast activity were noted in right parieto-occipital region which at times appeared rhythmic lasting 3-8 seconds consistent with brief-ictal-interictal rhythmic discharges. Hyperventilation and photic stimulation were not performed.    ABNORMALITY - Brief-ictal-interictal rhythmic discharges, right parieto-occipital region  IMPRESSION: This study showed evidence of epileptogenicity arising from right parietal occipital region with increased risk of seizure recurrence.  No definite seizures were seen during the study.  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 We are still waiting on results of initial CSF quantitative VL from first LP. Currently on day 8 of 14. We can transition to valganciclovir once ready for discharge. We will follow up on most recent culture results. Will follow up on send out labs for quantitative viral load  Prolonged hospitalization= recommend pt/ot to see if needs acute rehab  [facility]  Pager: [phone]  10/23/2021, 9:27 AM

## 2021-10-23 NOTE — Procedures (Addendum)
Patient Name: Dustin Wells  MRN: [id number]  Epilepsy Attending: Lora Havens  Referring Physician/Provider: Donnetta Simpers, MD  Duration: 10/22/2021 1957 to 10/23/2021 1202   Patient history: 56 year old male with altered mental status.  EEG to evaluate for seizure.   Level of alertness: awake, asleep   AEDs during EEG study: LEV, LCM, Phenobarb   Technical aspects: This EEG study was done with scalp electrodes positioned according to the 10-20 International system of electrode placement. Electrical activity was reviewed with band pass filter of 1-'70Hz'$ , sensitivity of 7 uV/mm, display speed of 65m/sec with a '60Hz'$  notched filter applied as appropriate. EEG data were recorded continuously and digitally stored.  Video monitoring was available and reviewed as appropriate.   Description:  The posterior dominant rhythm consists of 9-10 Hz activity of moderate voltage (25-35 uV) seen predominantly in posterior head regions, symmetric and reactive to eye opening and eye closing.  Sleep was characterized by sleep spindles (12 to 14 Hz), maximal frontocentral region.  Abundant polyspikes were noted in right parieto-occipital region which at times appeared quasiperiodic at 0.25 to 0.5 Hz. Hyperventilation and photic stimulation were not performed.     ABNORMALITY -Polyspikes, right parieto-occipital region   IMPRESSION: This study showed evidence of epileptogenicity arising from right parietal occipital region.  No definite seizures were seen during the study.   Melton Walls OBarbra Sarks

## 2021-10-23 NOTE — Progress Notes (Signed)
ANTICOAGULATION CONSULT NOTE - Initial Consult  Pharmacy Consult for apixaban Indication: atrial fibrillation  Allergies  Allergen Reactions   Rivaroxaban Diarrhea, Nausea Only and Other (See Comments)    Nausea, diarrhea, abdominal pain Nausea, diarrhea, abdominal pain Nausea, diarrhea, abdominal pain    Sulfamethoxazole-Trimethoprim Rash    Patient Measurements: Height: 6' (182.9 cm) (recorded on 10/19/21) Weight: 115.6 kg (254 lb 12.8 oz) (recorded on 10/19/21) IBW/kg (Calculated) : 77.6 Heparin Dosing Weight: 102.6 kg  Vital Signs: Temp: 98.6 F (37 C) (08/05 0734) Temp Source: Oral (08/05 0734) BP: 118/81 (08/05 0734) Pulse Rate: 73 (08/05 0734)  Labs: Recent Labs    10/20/21 1650 10/21/21 0602 10/21/21 0602 10/22/21 0616 10/22/21 1907 10/23/21 0244  HGB  --  12.5*   < > 13.5  --  12.8*  HCT  --  37.2*  --  40.8  --  38.5*  PLT  --  237  --  240  --  294  APTT 61*  --   --   --  35 57*  HEPARINUNFRC  --   --   --   --  1.01* 1.02*  CREATININE  --  1.43*  --  1.48*  --  1.39*   < > = values in this interval not displayed.     Estimated Creatinine Clearance: 80.7 mL/min (A) (by C-G formula based on SCr of 1.39 mg/dL (H)).   Medical History: Past Medical History:  Diagnosis Date   Atrial fibrillation (HCC)    CHF (congestive heart failure) ([place])    Hypertension    Renal disorder    Stroke [facility]    Testicular cancer (HCC)     Medications:  Medications Prior to Admission  Medication Sig Dispense Refill Last Dose   acetaminophen (TYLENOL) 325 MG tablet Take 650 mg by mouth every 6 (six) hours as needed for mild pain.   10/14/2021   allopurinol (ZYLOPRIM) 300 MG tablet Take 300 mg by mouth daily.   Past Week   amLODipine (NORVASC) 5 MG tablet Take 5 mg by mouth daily.   10/14/2021   apixaban (ELIQUIS) 5 MG TABS tablet Take 5 mg by mouth 2 (two) times daily.   10/14/2021 at 0730   atorvastatin (LIPITOR) 40 MG tablet Take 40 mg by mouth daily.   10/14/2021    Cholecalciferol 25 MCG (1000 UT) tablet Take 1,000 Units by mouth daily.   10/14/2021   clomiPHENE (CLOMID) 50 MG tablet Take 1/2 tablet daily (Patient taking differently: Take 25 mg by mouth at bedtime. Take 1/2 tablet daily) 90 tablet 3 10/14/2021   cyanocobalamin 1000 MCG tablet Take 1,000 mcg by mouth daily.   10/14/2021   flecainide (TAMBOCOR) 100 MG tablet Take 100 mg by mouth every 12 (twelve) hours.   09/17/5282   folic acid (FOLVITE) 1 MG tablet Take by mouth.   Past Week   gabapentin (NEURONTIN) 300 MG capsule Take 1-3 tabs at night   Past Week   LORazepam (ATIVAN) 2 MG tablet Take 2 mg by mouth at bedtime as needed.   unk   losartan (COZAAR) 100 MG tablet Take 1 tablet (100 mg total) by mouth daily. Hold until you see your nephrologist   Past Month   magnesium oxide (MAG-OX) 400 (240 Mg) MG tablet Take 1 tablet by mouth 2 (two) times daily.   Past Month   metoprolol succinate (TOPROL-XL) 100 MG 24 hr tablet Take 1 tablet (100 mg total) by mouth daily. hold   Past Month  at [facility], 2 MG/DOSE, 8 MG/3ML SOPN Inject 2 mg into the skin once a week. Monday   10/11/2021   pantoprazole (PROTONIX) 40 MG tablet Take 40 mg by mouth daily as needed (acid reflux).   unk   sildenafil (REVATIO) 20 MG tablet Take 3-5 tablets 1 hr prior to intercourse as needed (Patient taking differently: Take 60-100 mg by mouth every hour as needed (Prior to intercourse). Take 3-5 tablets 1 hr prior to intercourse as needed) 60 tablet 3    sodium bicarbonate 650 MG tablet Take 650 mg by mouth 2 (two) times daily.   10/14/2021   torsemide (DEMADEX) 10 MG tablet Take 1 tablet (10 mg total) by mouth daily. Hold until you see your nephrologist   Past Month   ganciclovir 290 mg in sodium chloride 0.9 % 100 mL Inject 290 mg into the vein every 12 (twelve) hours.      Multiple Vitamin (MULTIVITAMIN WITH MINERALS) TABS tablet Take 1 tablet by mouth daily.      thiamine (VITAMIN B1) 100 MG tablet Take 1 tablet (100 mg total) by  mouth daily.      thiamine (VITAMIN B1) 100 MG/ML injection Inject 1 mL (100 mg total) into the vein daily. 25 mL     Scheduled:   allopurinol  300 mg Oral Daily   atorvastatin  40 mg Oral Daily   clomiPHENE  25 mg Oral QHS   cyanocobalamin  1,000 mcg Oral Daily   flecainide  100 mg Oral G38V   folic acid  1 mg Oral Daily   lacosamide  100 mg Oral BID   levETIRAcetam  1,500 mg Oral BID   metoprolol succinate  25 mg Oral Daily   multivitamin with minerals  1 tablet Oral Daily   phenobarbital  97.2 mg Oral Q1200   Followed by   Derrill Memo ON 10/24/2021] phenobarbital  64.8 mg Oral Q1200   Followed by   Derrill Memo ON 10/26/2021] phenobarbital  32.4 mg Oral Q1200   Followed by   Derrill Memo ON 10/28/2021] phenobarbital  16.2 mg Oral Q1200   sodium bicarbonate  650 mg Oral BID   thiamine  100 mg Oral Daily   Or   thiamine  100 mg Intravenous Daily   Infusions:   ganciclovir (CYTOVENE) 465 mg in sodium chloride 0.9 % 100 mL IVPB 465 mg (10/22/21 2255)   heparin 1,900 Units/hr (10/23/21 0525)    Assessment: 56 y.o male on Apixaban 5 mg BID prior to [place] for h/o nonvalvular atrial fibrillation.   Admitted to [facility] 7/28.  Transferred to [facility] on 8/2 for continuous EEG study due to  concern of multiple subclinical seizures.  Inpatient: 7/28 Apixaban (Eliquis) > 8/1 switched to Heparin infusion > 8/2 > back to Eliquis , resumed 8/2 PM. On 10/22/21 pharmacist consulted to start IV hepairn as apixaban held due to possible LP procedure. Last dose of Apixaban given 8/3 at 22:04. Heparin has remained slightly subtherapeutic and no other invasive procedures are planned, can transition back to apixaban.    Allergies: Pat reports xarelto causing Nausea, diarrhea, abdominal pain    Goal of Therapy:  Monitor platelets by anticoagulation protocol: Yes   Plan:  Resume Apixaban 5 mg BID Stop Heparin drip at time of next apixaban dose and discontinued aPTT and HL   Varney Daily, PharmD PGY2 Pharmacy  Resident  Please check AMION for all [facility] pharmacy phone numbers After 10:00 PM call main pharmacy [phone]

## 2021-10-23 NOTE — Evaluation (Signed)
Occupational Therapy Evaluation Patient Details Name: Dustin Wells MRN: [id number] DOB: Apr 14, 1965 Today's Date: 10/23/2021   History of Present Illness 56 y.o. male with medical history significant of hypertension, hyperlipidemia, prediabetes, GERD, gout, depression with anxiety, testicular cancer, diastolic CHF, atrial fibrillation on Eliquis, CKD-3B, alcohol abuse, OSA on CPAP, obesity with BMI 35.94, syncope, thrombocytopenia, who presented to [facility] on 7/28 with altered mental status, shortness breath, abdominal pain and distention, fever.   Patient was recently hospitalized from 7/26-7/27 due to syncope. prior MCA Infarct.   Clinical Impression   Patient admitted for the above diagnosis.  Patient endorses he is at his baseline, and is hoping to return home.  PTA he lives at home with his spouse, who does assist with community mobility.  He generally walks without an AD, and is able to complete his own ADL.  No significant deficits noted, and no acute OT indicated.  No post acute OT is anticipated.       Recommendations for follow up therapy are one component of a multi-disciplinary discharge planning process, led by the attending physician.  Recommendations may be updated based on patient status, additional functional criteria and insurance authorization.   Follow Up Recommendations  No OT follow up    Assistance Recommended at Discharge PRN  Patient can return home with the following Assist for transportation    Functional Status Assessment  Patient has had a recent decline in their functional status and demonstrates the ability to make significant improvements in function in a reasonable and predictable amount of time.  Equipment Recommendations  None recommended by OT    Recommendations for Other Services  PT Consult     Precautions / Restrictions Precautions Precautions: Fall Restrictions Weight Bearing Restrictions: No      Mobility Bed Mobility                     Transfers Overall transfer level: Needs assistance Equipment used: None Transfers: Sit to/from Stand Sit to Stand: Supervision                  Balance Overall balance assessment: Mild deficits observed, not formally tested Sitting-balance support: Feet supported Sitting balance-Leahy Scale: Good     Standing balance support: No upper extremity supported Standing balance-Leahy Scale: Fair Standing balance comment: patient shuffels                           ADL either performed or assessed with clinical judgement   ADL Overall ADL's : At baseline                                       General ADL Comments: Patient at the sink completing sponge bath and stand grooming.  Generalized supervision and increased time.     Vision Patient Visual Report: No change from baseline       Perception Perception Perception: Within Functional Limits   Praxis Praxis Praxis: Intact    Pertinent Vitals/Pain Pain Assessment Pain Assessment: No/denies pain Pain Intervention(s): Monitored during session     Hand Dominance Right   Extremity/Trunk Assessment Upper Extremity Assessment Upper Extremity Assessment: Overall WFL for tasks assessed LUE Deficits / Details: pt endorses some coordination deficits since prior stroke in 2018 LUE Coordination: decreased fine motor   Lower Extremity Assessment Lower Extremity Assessment: Defer to PT evaluation   Cervical /  Trunk Assessment Cervical / Trunk Assessment: Kyphotic   Communication Communication Communication: No difficulties   Cognition Arousal/Alertness: Awake/alert Behavior During Therapy: WFL for tasks assessed/performed, Flat affect Overall Cognitive Status: History of cognitive impairments - at baseline                                 General Comments: Patient is following commands well, demonstrates good safety.  A&Ox4.     General Comments       Exercises      Shoulder Instructions      Home Living Family/patient expects to be discharged to:: Private residence Living Arrangements: Spouse/significant other Available Help at Discharge: Family;Available 24 hours/day Type of Home: House Home Access: Stairs to enter [employer] of Steps: 1-2 Entrance Stairs-Rails: None Home Layout: One level     Bathroom Shower/Tub: Teacher, early years/pre: Handicapped height     Home Equipment: None          Prior Functioning/Environment Prior Level of Function : Independent/Modified Independent;Working/employed             Mobility Comments: pt works partime at [employer], patient does not drive as he doesn't have his license (wife transports him to/from work) ADLs Comments: bathes & dresses independently at baseline, able to care for his medications        OT Problem List: Decreased activity tolerance      OT Treatment/Interventions:      OT Goals(Current goals can be found in the care plan section) Acute Rehab OT Goals Patient Stated Goal: Hoping to return home OT Goal Formulation: With patient Time For Goal Achievement: 10/29/21 Potential to Achieve Goals: Good  OT Frequency:      Co-evaluation              AM-PAC OT "6 Clicks" Daily Activity     Outcome Measure Help from another person eating meals?: None Help from another person taking care of personal grooming?: None Help from another person toileting, which includes using toliet, bedpan, or urinal?: A Little Help from another person bathing (including washing, rinsing, drying)?: A Little Help from another person to put on and taking off regular upper body clothing?: None Help from another person to put on and taking off regular lower body clothing?: A Little 6 Click Score: 21   End of Session Equipment Utilized During Treatment: Gait belt Nurse Communication: Mobility status  Activity Tolerance: Patient tolerated treatment well Patient left: in  chair;with call bell/phone within reach;with chair alarm set  OT Visit Diagnosis: Other symptoms and signs involving cognitive function                Time: 1548-1610 OT Time Calculation (min): 22 min Charges:  OT General Charges $OT Visit: 1 Visit OT Evaluation $OT Eval Moderate Complexity: 1 Mod  10/23/2021  RP, OTR/L  Acute Rehabilitation Services  Office:  [phone]   Metta Clines 10/23/2021, 4:19 PM

## 2021-10-23 NOTE — Progress Notes (Signed)
Progress Note  Patient: Dustin Wells [id number]:[id number] DOB: 08-08-1965  DOA: 10/20/2021  DOS: 10/23/2021    Brief hospital course: Dustin Wells is a 56 y.o. male with a history of stage II seminoma s/p XRT, right parietal CVA, alcohol abuse, HTN, prediabetes, HLD, AFib, chronic HFpEF, obesity, OSA on CPAP, thrombocytopenia who presented initially to [facility] 7/26 with presyncope and hypotension improved with IVF, though was febrile prior to discharge, and sent home only to return 7/28 with continued back pain, neck pain, confusion, urinary incontinence, abdominal pain and distention, fever. Among other work up, CT abd/pelvis showed splenomegaly and bilateral perinephric stranding. He met sepsis criteria, started on ceftriaxone. ID was consulted, LP on 7/29 was done for the concern of meningitis, showed a neutrophilic pleocytosis (WBC 804, 70% PMNs), protein 105, and HHV-6 positive. Antibiotics were broadened, a single dose of decadron was given, and acyclovir given empirically initially. This was changed to ganciclovir, and empiric antibiotics ultimately were stopped. To date, blood and CSF cultures have remained negative. CSF cultures negative. MRI brain revealed stable encephalomalacia of right posterior temporal and parietal lobe. Neurology was consulted, noting he had several episodes per day of uncontrollable left hand spasms and started keppra. Additional AEDs added due to persistent seizure activity, prompting transfer to [facility] on 8/2 for continuous EEG. Phenobarbital has been added with planned taper. Due to diagnostic uncertainty, repeat LP was performed 8/4 still showing +HHV-6 with decreased WBCs with lymphocytic predominance. EEG continues to show right parietal occipital epileptogenicity.   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 - HHV-6 quantitative analysis remains pending from initial LP, also sent  with repeat on 8/4. Cell count has declined (804 > 32 and now with lymphocytic predominance (92%). Will continue ganciclovir, follow up pending tests.  - Antibiotics have been stopped. Monitoring CSF culture (7/28, NGTD) and blood cultures (7/28 NGTD, 8/1 NGTD) - ID, Dr. Baxter Flattery consult appreciated.  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  - Neurology recommendations appreciated, continuing vimpat 199m BID, keppra 1,5031mBID, and planned phenobarbital taper.  - On continuous EEG per neurology, +epileptogenicity arising from right parieto-occipital area. - Continue prn ativan - Continue seizure precautions  Acute metabolic encephalopathy: Also likely due to seizures/post-ictal. Essentially resolved. Note RPR, B12, ammonia, TSH wnl.  Sepsis: Met criteria at admission. Will further delineate as clinical data become available. Currently resolved.  Bilateral perinephric stranding: With prominent urinary symptoms PTA, though no WBCs on urinalysis. There were RBCs, so ?passed stone(s).   - ID recommended stopping antibiotics after 4 days of treatment. We will plan to continue monitoring off antibiotics. Urinary symptoms have resolved.  Alcohol abuse: EtOH negative at admission. - No current evidence of withdrawal.  - Cessation counseling provided.   Splenomegaly, thrombocytopenia: Chronic. Platelets have normalized over past few days.  OSA:  - CPAP qHS advised (hasn't been using) - Will follow up for reevaluation as outpatient to get new machine.   Chronic HFpEF: Stable - Was given IV lasix x1, though acute decompensation not suspected at this time. Will continue to hold torsemide and losartan (said to be on hold PTA anyway) - Restart metoprolol  - BP normotensive, holding norvasc for now.  Paroxysmal atrial fibrillation: [facility], has had multiple cardioversions, previously on amiodarone. Currently rhythm controlled. -  Continue eliquis - Continue flecainide for rhythm control, metoprolol for rate control. Currently in NSR/1st deg AVB - Continue telemetry  monitoring for now.    Stage II CKD: CrCl is currently 71m/min and continuing to improve, so previously cited stage IIIb no longer appears accurate.  - Avoid nephrotoxins.   History of right parietal embolic CVA: With imaging showing encephalomalacia and EEG showing focal findings.  - Remain on anticoagulation (transition back eliquis > heparin gtt for LP) - Continue statin  Elevated LFTs: No focal hepatic abnormality on CT, no biliary dilatation.  - Mild and improving.   Acute hypoxic respiratory failure: Unclear etiology, possible hypoventilation due to encephalopathy. CXR at admission showed atelectasis vs./favored over infiltrate without pulmonary edema. PE unlikely with chronic anticoagulation. This has resolved, so no further work up is currently planned.  HLD:  - Continue statin  History of seminoma: S/p radiation.  - Continue clomiphene.  Obesity: Estimated body mass index is 34.56 kg/m as calculated from the following:   Height as of this encounter: 6' (1.829 m).   Weight as of this encounter: 115.6 kg. - Since indication for ozempic is not diabetes, will hold weekly dose while admitted.  Subjective: No new complaints, not aware of left hand spasm or other seizure symptoms. No dyspnea or chest pain, no bleeding, no abdominal pain.   Objective: Vitals:   10/23/21 0003 10/23/21 0346 10/23/21 0734 10/23/21 1140  BP: 122/72 110/78 118/81 120/86  Pulse: 65 72 73 77  Resp: 20 20 16 20   Temp: 97.7 F (36.5 C) 98.3 F (36.8 C) 98.6 F (37 C) 97.9 F (36.6 C)  TempSrc: Oral  Oral Oral  SpO2: 98% 98% 97% 97%  Weight:      Height:       Gen: 56y.o. male in no distress Pulm: Nonlabored breathing room air. Clear. CV: Regular rate and rhythm. No murmur, rub, or gallop. No JVD, no dependent edema. GI: Abdomen soft, non-tender,  non-distended, with normoactive bowel sounds.  Ext: Warm, no deformities Skin: No rashes, lesions or ulcers on visualized skin. EEG leads remain. Neuro: Alert and oriented. No focal neurological deficits. Psych: Judgement and insight appear fair. Mood euthymic & affect congruent. Behavior is appropriate.    Data Personally reviewed: CBC: Recent Labs  Lab 10/19/21 1634 10/20/21 0437 10/21/21 0602 10/22/21 0616 10/23/21 0244  WBC 6.9 7.1 7.7 6.5 5.8  NEUTROABS 4.3  --   --  4.3  --   HGB 13.0 13.6 12.5* 13.5 12.8*  HCT 39.5 41.1 37.2* 40.8 38.5*  MCV 92.7 93.6 93.9 96.0 94.6  PLT 229 261 237 240 2937  Basic Metabolic Panel: Recent Labs  Lab 10/19/21 0511 10/20/21 0437 10/21/21 0602 10/22/21 0616 10/23/21 0244  NA 138 139 137 134* 135  K 4.0 4.3 4.5 4.4 4.1  CL 105 108 108 103 105  CO2 26 21* 22 22 23   GLUCOSE 92 106* 93 91 96  BUN 33* 30* 20 16 17   CREATININE 1.68* 1.59* 1.43* 1.48* 1.39*  CALCIUM 9.2 9.6 9.5 9.6 9.4  MG 1.9 2.1  --   --   --    GFR: Estimated Creatinine Clearance: 80.7 mL/min (A) (by C-G formula based on SCr of 1.39 mg/dL (H)). Liver Function Tests: Recent Labs  Lab 10/20/21 0437 10/22/21 0616 10/23/21 0244  AST 55* 36 33  ALT 69* 56* 50*  ALKPHOS 68 74 84  BILITOT 1.4* 0.9 0.8  PROT 7.3 6.6 6.3*  ALBUMIN 3.3* 3.0* 3.0*   No results for input(s): "LIPASE", "AMYLASE" in the last 168 hours.  No results for input(s): "AMMONIA" in  the last 168 hours.  Coagulation Profile: No results for input(s): "INR", "PROTIME" in the last 168 hours.  Cardiac Enzymes: No results for input(s): "CKTOTAL", "CKMB", "CKMBINDEX", "TROPONINI" in the last 168 hours. BNP (last 3 results) No results for input(s): "PROBNP" in the last 8760 hours. HbA1C: Recent Labs    10/22/21 0616  HGBA1C 5.8*   CBG: Recent Labs  Lab 10/19/21 1656 10/19/21 2016 10/20/21 0828 10/20/21 1126 10/20/21 1746  GLUCAP 108* 84 89 83 109*   Lipid Profile: No results for  input(s): "CHOL", "HDL", "LDLCALC", "TRIG", "CHOLHDL", "LDLDIRECT" in the last 72 hours. Thyroid Function Tests: No results for input(s): "TSH", "T4TOTAL", "FREET4", "T3FREE", "THYROIDAB" in the last 72 hours. Anemia Panel: Recent Labs    10/23/21 0244  VITAMINB12 2,486*   Urine analysis:    Component Value Date/Time   COLORURINE YELLOW (A) 10/15/2021 1221   APPEARANCEUR CLEAR (A) 10/15/2021 1221   APPEARANCEUR Clear 09/29/2021 0912   LABSPEC 1.012 10/15/2021 1221   PHURINE 6.0 10/15/2021 1221   GLUCOSEU 50 (A) 10/15/2021 1221   HGBUR MODERATE (A) 10/15/2021 1221   BILIRUBINUR NEGATIVE 10/15/2021 1221   BILIRUBINUR Negative 09/29/2021 0912   KETONESUR NEGATIVE 10/15/2021 1221   PROTEINUR 30 (A) 10/15/2021 1221   NITRITE NEGATIVE 10/15/2021 1221   LEUKOCYTESUR NEGATIVE 10/15/2021 1221   Recent Results (from the past 240 hour(s))  SARS Coronavirus 2 by RT PCR (hospital order, performed in [facility] hospital lab) *cepheid single result test* Urine, Clean Catch     Status: None   Collection Time: 10/13/21  4:29 PM   Specimen: Urine, Clean Catch; Nasal Swab  Result Value Ref Range Status   SARS Coronavirus 2 by RT PCR NEGATIVE NEGATIVE Final    Comment: (NOTE) SARS-CoV-2 target nucleic acids are NOT DETECTED.  The SARS-CoV-2 RNA is generally detectable in upper and lower respiratory specimens during the acute phase of infection. The lowest concentration of SARS-CoV-2 viral copies this assay can detect is 250 copies / mL. A negative result does not preclude SARS-CoV-2 infection and should not be used as the sole basis for treatment or other patient management decisions.  A negative result may occur with improper specimen collection / handling, submission of specimen other than nasopharyngeal swab, presence of viral mutation(s) within the areas targeted by this assay, and inadequate number of viral copies (<250 copies / mL). A negative result must be combined with  clinical observations, patient history, and epidemiological information.  Fact Sheet for Patients:   https://www.patel.info/  Fact Sheet for Healthcare Providers: https://hall.com/  This test is not yet approved or  cleared by the [place] FDA and has been authorized for detection and/or diagnosis of SARS-CoV-2 by FDA under an Emergency Use Authorization (EUA).  This EUA will remain in effect (meaning this test can be used) for the duration of the COVID-19 declaration under Section 564(b)(1) of the Act, 21 U.S.C. section 360bbb-3(b)(1), unless the authorization is terminated or revoked sooner.  Performed at [facility], [place], [place] [address]   Resp Panel by RT-PCR (Flu A&B, Covid) Anterior Nasal Swab     Status: None   Collection Time: 10/15/21 10:23 AM   Specimen: Anterior Nasal Swab  Result Value Ref Range Status   SARS Coronavirus 2 by RT PCR NEGATIVE NEGATIVE Final    Comment: (NOTE) SARS-CoV-2 target nucleic acids are NOT DETECTED.  The SARS-CoV-2 RNA is generally detectable in upper respiratory specimens during the acute phase of infection. The lowest concentration  of SARS-CoV-2 viral copies this assay can detect is 138 copies/mL. A negative result does not preclude SARS-Cov-2 infection and should not be used as the sole basis for treatment or other patient management decisions. A negative result may occur with  improper specimen collection/handling, submission of specimen other than nasopharyngeal swab, presence of viral mutation(s) within the areas targeted by this assay, and inadequate number of viral copies(<138 copies/mL). A negative result must be combined with clinical observations, patient history, and epidemiological information. The expected result is Negative.  Fact Sheet for Patients:  EntrepreneurPulse.com.au  Fact Sheet for Healthcare Providers:   IncredibleEmployment.be  This test is no t yet approved or cleared by the [place] FDA and  has been authorized for detection and/or diagnosis of SARS-CoV-2 by FDA under an Emergency Use Authorization (EUA). This EUA will remain  in effect (meaning this test can be used) for the duration of the COVID-19 declaration under Section 564(b)(1) of the Act, 21 U.S.C.section 360bbb-3(b)(1), unless the authorization is terminated  or revoked sooner.       Influenza A by PCR NEGATIVE NEGATIVE Final   Influenza B by PCR NEGATIVE NEGATIVE Final    Comment: (NOTE) The Xpert Xpress SARS-CoV-2/FLU/RSV plus assay is intended as an aid in the diagnosis of influenza from Nasopharyngeal swab specimens and should not be used as a sole basis for treatment. Nasal washings and aspirates are unacceptable for Xpert Xpress SARS-CoV-2/FLU/RSV testing.  Fact Sheet for Patients: EntrepreneurPulse.com.au  Fact Sheet for Healthcare Providers: IncredibleEmployment.be  This test is not yet approved or cleared by the [place] FDA and has been authorized for detection and/or diagnosis of SARS-CoV-2 by FDA under an Emergency Use Authorization (EUA). This EUA will remain in effect (meaning this test can be used) for the duration of the COVID-19 declaration under Section 564(b)(1) of the Act, 21 U.S.C. section 360bbb-3(b)(1), unless the authorization is terminated or revoked.  Performed at [facility], [address] [place], [place] [address]   Blood Culture (routine x 2)     Status: None   Collection Time: 10/15/21 10:23 AM   Specimen: BLOOD  Result Value Ref Range Status   Specimen Description BLOOD LEFT ANTECUBITAL  Final   Special Requests   Final    BOTTLES DRAWN AEROBIC AND ANAEROBIC Blood Culture adequate volume   Culture   Final    NO GROWTH 5 DAYS Performed at [facility], [address] [place], [place]  [address]    Report Status 10/20/2021 FINAL  Final  Urine Culture     Status: None   Collection Time: 10/15/21 12:21 PM   Specimen: In/Out Cath Urine  Result Value Ref Range Status   Specimen Description   Final    IN/OUT CATH URINE Performed at [facility], [address], [place], [place] [address]    Special Requests   Final    NONE Performed at [facility], [address] [place], [place] [address]    Culture   Final    NO GROWTH Performed at [facility], [place] [address] [place], [place] [address]    Report Status 10/16/2021 FINAL  Final  CSF culture w Gram Stain     Status: None   Collection Time: 10/15/21  4:57 PM   Specimen: CSF; Cerebrospinal Fluid  Result Value Ref Range Status   Specimen Description   Final    CSF Performed at [facility], [address] [place],  [address]  Special Requests   Final    NONE Performed at [facility], [address] [place], [place] [address]    Gram Stain   Final    WBC SEEN RED BLOOD CELLS PRESENT NO ORGANISMS SEEN Performed at [facility], [address] [place], [place] [address]    Culture   Final    NO GROWTH 3 DAYS Performed at [facility], [place] [address], [place], [place] [address]    Report Status 10/19/2021 FINAL  Final  Culture, fungus without smear     Status: None (Preliminary result)   Collection Time: 10/15/21  4:58 PM   Specimen: CSF; Cerebrospinal Fluid  Result Value Ref Range Status   Specimen Description   Final    CSF Performed at [facility], [address] [place], [place] [address]    Special Requests   Final    NONE Performed at [facility], [address] [place], [place] [address]    Culture   Final    NO FUNGUS ISOLATED AFTER 7 DAYS Performed at [facility], [place] [address], [place], [place] [address]    Report Status PENDING  Incomplete  Culture, blood (Routine X 2) w Reflex to ID  Panel     Status: None   Collection Time: 10/17/21  6:01 PM   Specimen: BLOOD LEFT ARM  Result Value Ref Range Status   Specimen Description BLOOD LEFT ARM  Final   Special Requests   Final    BOTTLES DRAWN AEROBIC AND ANAEROBIC BACTEROIDES CACCAE   Culture   Final    NO GROWTH 5 DAYS Performed at [facility], [address] [place], [place] [address]    Report Status 10/22/2021 FINAL  Final  Culture, blood (Routine X 2) w Reflex to ID Panel     Status: None (Preliminary result)   Collection Time: 10/19/21  5:11 AM   Specimen: BLOOD  Result Value Ref Range Status   Specimen Description BLOOD RIGHT [facility]  Final   Special Requests   Final    BOTTLES DRAWN AEROBIC AND ANAEROBIC Blood Culture adequate volume   Culture   Final    NO GROWTH 4 DAYS Performed at [facility], [address] [place], [place] [address]    Report Status PENDING  Incomplete  Culture, blood (Routine X 2) w Reflex to ID Panel     Status: None (Preliminary result)   Collection Time: 10/19/21  5:11 AM   Specimen: BLOOD  Result Value Ref Range Status   Specimen Description BLOOD RIGHT HAND  Final   Special Requests   Final    BOTTLES DRAWN AEROBIC AND ANAEROBIC Blood Culture results may not be optimal due to an inadequate volume of blood received in culture bottles   Culture   Final    NO GROWTH 4 DAYS Performed at [facility], [address] [place], [place] [address]    Report Status PENDING  Incomplete  CSF culture w Gram Stain     Status: None (Preliminary result)   Collection Time: 10/22/21  1:52 PM   Specimen: PATH Cytology CSF; Cerebrospinal Fluid  Result Value Ref Range Status   Specimen Description CSF  Final   Special Requests NONE  Final   Gram Stain   Final    WBC PRESENT, PREDOMINANTLY MONONUCLEAR NO ORGANISMS SEEN CYTOSPIN SMEAR    Culture   Final    NO GROWTH < 24 HOURS Performed at  [facility], [place] [address], [place], [place] [address]     Report Status PENDING  Incomplete     DG FL GUIDED LUMBAR PUNCTURE  Result Date: 10/22/2021 CLINICAL DATA:  Seizures, encephalopathy, recent fevers. EXAM: LUMBAR PUNCTURE UNDER FLUOROSCOPY PROCEDURE: Due to the patient's altered mental status, Narda Rutherford, NP obtained informed consent from the patient's wife Chaston Bradburn) via telephone prior to the procedure. This process included a discussion of procedural risks. An appropriate skin entry site was determined under fluoroscopy and marked. A time-out was performed. The operator donned sterile gloves and a mask. The skin entry site was prepped with Betadine, draped in the usual sterile fashion and infiltrated locally with 1% lidocaine. Under fluoroscopic guidance, a 20 gauge spinal needle was advanced into the thecal sac at the L4-L5 level. There was spontaneous return of clear CSF with an opening pressure of 20 cm water. 16 mL of CSF were collected for laboratory studies. The inner stylet was placed back in the needle and the needle was removed in its entirety. A dressing was applied to the skin entry site. The patient tolerated the procedure well. No immediate post-procedure complication was apparent. The procedure was performed by Narda Rutherford, NP, and was supervise interpreted by Kellie Simmering, D.O. FLUOROSCOPY: Fluoroscopy time: 12 seconds (1.90 mGy). IMPRESSION: Technically successful fluoroscopically-guided L4-L5 lumbar puncture. Opening pressure: 20 cm water. 16 mL of CSF obtained for laboratory studies. Electronically Signed   By: Kellie Simmering D.O.   On: 10/22/2021 14:58    Family Communication: None at bedside  Disposition: Status is: Inpatient Remains inpatient appropriate because: Continuous EEG, work up incomplete. Planned Discharge Destination: Home  Patrecia Pour, MD 10/23/2021 11:43 AM Page by [email or web address]

## 2021-10-23 NOTE — Progress Notes (Signed)
LTM EEG discontinued - no skin breakdown at unhook.   

## 2021-10-24 LAB — CBC
HCT: 39.2 % (ref 39.0–52.0)
Hemoglobin: 12.8 g/dL — ABNORMAL LOW (ref 13.0–17.0)
MCH: 31.2 pg (ref 26.0–34.0)
MCHC: 32.7 g/dL (ref 30.0–36.0)
MCV: 95.6 fL (ref 80.0–100.0)
Platelets: 301 10*3/uL (ref 150–400)
RBC: 4.1 MIL/uL — ABNORMAL LOW (ref 4.22–5.81)
RDW: 14.3 % (ref 11.5–15.5)
WBC: 5.6 10*3/uL (ref 4.0–10.5)
nRBC: 0 % (ref 0.0–0.2)

## 2021-10-24 LAB — CULTURE, BLOOD (ROUTINE X 2)
Culture: NO GROWTH
Culture: NO GROWTH
Special Requests: ADEQUATE

## 2021-10-24 LAB — BASIC METABOLIC PANEL
Anion gap: 8 (ref 5–15)
BUN: 15 mg/dL (ref 6–20)
CO2: 20 mmol/L — ABNORMAL LOW (ref 22–32)
Calcium: 9.3 mg/dL (ref 8.9–10.3)
Chloride: 106 mmol/L (ref 98–111)
Creatinine, Ser: 1.36 mg/dL — ABNORMAL HIGH (ref 0.61–1.24)
GFR, Estimated: >60 mL/min (ref >60)
Glucose, Bld: 97 mg/dL (ref 70–99)
Potassium: 4.5 mmol/L (ref 3.5–5.1)
Sodium: 134 mmol/L — ABNORMAL LOW (ref 135–145)

## 2021-10-24 NOTE — Evaluation (Signed)
Physical Therapy Evaluation Patient Details Name: Dustin Wells MRN: [id number] DOB: 10/22/1965 Today's Date: 10/24/2021  History of Present Illness  The pt is a 56 yo male presenting 7/28 with AMS, SOB, abdominal pain and distention, and fever. Pt recently admitted 7/26-7/27 due to syncope, MRI during that admission showed chronic R MCA infarct. 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 PMH: HTN, HLD, prediabetes, GERD, gout, depression with anxiety, testicular CA, dCHF, a-fib on eliquis, CKD3B, alcohol abuse, OSA on CPAP, obesity, syncope, thrombocytopenia   Clinical Impression  Pt in bed upon arrival of PT, agreeable to evaluation at this time. Prior to admission the pt was independent with mobility without need for DME, working part time at [employer]. The pt now presents with limitations in functional mobility, strength, power, dynamic stability, and endurance due to above dx, and will continue to benefit from skilled PT to address these deficits. The pt was able to complete sit-stands and bed mobility without physical assist, but requires increased time and effort to rise. The pt also demos moderate instability with any addition of dynamic balance challenge, and will benefit from continued skilled PT acutely and OPPT follow up to address these deficits.   Dynamic Gait Index (DGI): 13/24 (<19 indicates increased risk for falls)       Recommendations for follow up therapy are one component of a multi-disciplinary discharge planning process, led by the attending physician.  Recommendations may be updated based on patient status, additional functional criteria and insurance authorization.  Follow Up Recommendations Outpatient PT (balance)      Assistance Recommended at Discharge Frequent or constant Supervision/Assistance   Patient can return home with the following  A little help with walking and/or transfers;A little help with bathing/dressing/bathroom;Assistance with cooking/housework;Assist for transportation;Help with stairs or ramp for entrance;Direct supervision/assist for medications management    Equipment Recommendations Rolling walker (2 wheels)  Recommendations for Other Services       Functional Status Assessment Patient has had a recent decline in their functional status and demonstrates the ability to make significant improvements in function in a reasonable and predictable amount of time.     Precautions / Restrictions Precautions Precautions: Fall Restrictions Weight Bearing Restrictions: No      Mobility  Bed Mobility Overal bed mobility: Needs Assistance Bed Mobility: Supine to Sit     Supine to sit: Supervision     General bed mobility comments: increased time and effort, no assist given    Transfers Overall transfer level: Needs assistance Equipment used: None Transfers: Sit to/from Stand Sit to Stand: Min guard           General transfer comment: minG for safety, no assist given    Ambulation/Gait Ambulation/Gait assistance: Min guard Gait Distance (Feet): 200 Feet Assistive device: None Gait Pattern/deviations: Step-through pattern, Decreased stride length Gait velocity: decreased     General Gait Details: pt with slowed gait but no overt LOB with walking. more slowed and moderate instability with balance challenge  Stairs Stairs: Yes Stairs assistance: Min guard Stair Management: Two rails, Alternating pattern, Forwards Number of Stairs: 4 General stair comments: minG with use of rail    Balance Overall balance assessment: Mild deficits observed, not formally tested Sitting-balance support: Feet supported Sitting balance-Leahy Scale: Good Sitting balance - Comments: able to don socks seated EOB and leaning back some without LOB   Standing  balance support: No upper extremity supported Standing balance-Leahy Scale: Fair Standing balance comment: no overt LOB but increased instability with any challenge                 Standardized Balance Assessment Standardized Balance Assessment : Dynamic Gait Index   Dynamic Gait Index Level Surface: Mild Impairment Change in Gait Speed: Moderate Impairment Gait with Horizontal Head Turns: Mild Impairment Gait with Vertical Head Turns: Moderate Impairment Gait and Pivot Turn: Mild Impairment Step Over Obstacle: Moderate Impairment Step Around Obstacles: Mild Impairment Steps: Mild Impairment Total Score: 13       Pertinent Vitals/Pain Pain Assessment Pain Assessment: No/denies pain Pain Intervention(s): Monitored during session    Home Living Family/patient expects to be discharged to:: Private residence Living Arrangements: Spouse/significant other Available Help at Discharge: Family;Available 24 hours/day Type of Home: House Home Access: Stairs to enter Entrance Stairs-Rails: None Entrance Stairs-Number of Steps: 1-2   Home Layout: One level Home Equipment: None      Prior Function Prior Level of Function : Independent/Modified Independent;Working/employed             Mobility Comments: walks without DME, was about to try to start biking again, pt works partime at [employer], patient does not drive as he doesn't have his license (wife transports him to/from work) ADLs Comments: bathes & dresses independently at baseline, able to care for his medications     Hand Dominance   Dominant Hand: Right    Extremity/Trunk Assessment   Upper Extremity Assessment Upper Extremity Assessment: Defer to OT evaluation    Lower Extremity Assessment Lower Extremity Assessment: Overall WFL for tasks assessed    Cervical / Trunk Assessment Cervical / Trunk Assessment: Kyphotic  Communication   Communication: No difficulties  Cognition Arousal/Alertness:  Awake/alert Behavior During Therapy: WFL for tasks assessed/performed, Flat affect Overall Cognitive Status: History of cognitive impairments - at baseline                                 General Comments: Patient is following commands well, demonstrates good safety.  A&Ox4.        General Comments General comments (skin integrity, edema, etc.): VSS on RA        Assessment/Plan    PT Assessment Patient needs continued PT services  PT Problem List Decreased strength;Decreased activity tolerance;Decreased balance;Decreased mobility;Decreased knowledge of use of DME;Decreased safety awareness       PT Treatment Interventions DME instruction;Therapeutic exercise;Gait training;Balance training;Stair training;Neuromuscular re-education;Functional mobility training;Therapeutic activities;Patient/family education;Modalities    PT Goals (Current goals can be found in the Care Plan section)  Acute Rehab PT Goals Patient Stated Goal: get better PT Goal Formulation: With patient/family Time For Goal Achievement: 11/07/21 Potential to Achieve Goals: Good    Frequency Min 3X/week     AM-PAC PT "6 Clicks" Mobility  Outcome Measure Help needed turning from your back to your side while in a flat bed without using bedrails?: None Help needed moving from lying on your back to sitting on the side of a flat bed without using bedrails?: None Help needed moving to and from a bed to a chair (including a wheelchair)?: A Little Help needed standing up from a chair using your arms (e.g., wheelchair or bedside chair)?: A Little Help needed to walk in hospital room?: A Little Help needed climbing 3-5 steps with a railing? : A Little 6 Click Score: 20  End of Session Equipment Utilized During Treatment: Gait belt Activity Tolerance: Patient tolerated treatment well Patient left: in chair;with chair alarm set;with call bell/phone within reach Nurse Communication: Mobility status PT  Visit Diagnosis: Unsteadiness on feet (R26.81);Muscle weakness (generalized) (M62.81);Difficulty in walking, not elsewhere classified (R26.2)    Time: 1243-1300 PT Time Calculation (min) (ACUTE ONLY): 17 min   Charges:   PT Evaluation $PT Eval Low Complexity: 1 Low          West Carbo, PT, DPT   Acute Rehabilitation Department  Sandra Cockayne 10/24/2021, 2:26 PM

## 2021-10-24 NOTE — Progress Notes (Signed)
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   Elzie Rings [place] [facility] [phone]

## 2021-10-24 NOTE — Progress Notes (Signed)
Progress Note  Patient: Dustin Wells TDV:[id number] DOB: 1965/06/27  DOA: 10/20/2021  DOS: 10/24/2021    Brief hospital course: Dustin Wells is a 56 y.o. male with a history of stage II seminoma s/p XRT, right parietal CVA, alcohol abuse, HTN, prediabetes, HLD, AFib, chronic HFpEF, obesity, OSA on CPAP, thrombocytopenia who presented initially to [facility] 7/26 with presyncope and hypotension improved with IVF, though was febrile prior to discharge, and sent home only to return 7/28 with continued back pain, neck pain, confusion, urinary incontinence, abdominal pain and distention, fever. Among other work up, CT abd/pelvis showed splenomegaly and bilateral perinephric stranding. He met sepsis criteria, started on ceftriaxone. ID was consulted, LP on 7/29 was done for the concern of meningitis, showed a neutrophilic pleocytosis (WBC 804, 70% PMNs), protein 105, and HHV-6 positive. Antibiotics were broadened, a single dose of decadron was given, and acyclovir given empirically initially. This was changed to ganciclovir, and empiric antibiotics ultimately were stopped. To date, blood and CSF cultures have remained negative. CSF cultures negative. MRI brain revealed stable encephalomalacia of right posterior temporal and parietal lobe. Neurology was consulted, noting he had several episodes per day of uncontrollable left hand spasms and started keppra. Additional AEDs added due to persistent seizure activity, prompting transfer to [facility] on 8/2 for continuous EEG. Phenobarbital has been added with planned taper. Due to diagnostic uncertainty, repeat LP was performed 8/4 still showing +HHV-6 with decreased WBCs with lymphocytic predominance. EEG continues to show right parietal occipital epileptogenicity.   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 - HHV-6 quantitative analysis remains pending from initial LP, also sent with repeat on 8/4. Cell count has declined (804 > 32 and now with lymphocytic predominance  (92%). Will continue ganciclovir, follow up pending tests. Would plan to discharge on oral equivalent 8/7 if stable. - Antibiotics have been stopped. Monitoring CSF culture (7/28, NGTD, 8/4 NGTD) and blood cultures (7/28 NGTD, 8/1 NGTD) - ID, Dr. Baxter Flattery consult appreciated.  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  - Neurology recommendations appreciated, continuing vimpat 157m BID, keppra 1,5011mBID, and planned phenobarbital taper.  - Continue prn ativan - Continue seizure precautions  Acute metabolic encephalopathy: Also likely due to seizures/post-ictal. Resolved. Note RPR, B12, ammonia, TSH wnl.  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 Will further delineate as clinical data become available. Currently resolved.  Bilateral perinephric stranding: With prominent urinary symptoms PTA, though no WBCs on urinalysis. There were RBCs, so ?passed stone(s). Pyelonephritis is felt to be ruled out. - ID recommended stopping antibiotics after 4 days of treatment. We will plan to continue monitoring off antibiotics. Urinary symptoms have resolved.  Alcohol abuse: EtOH negative at admission. - No current evidence of withdrawal.  - Cessation counseling provided.   Splenomegaly, thrombocytopenia: Chronic. Platelets have normalized over past few days.  OSA:  - CPAP qHS advised (hasn't been using) - Will follow up for reevaluation as outpatient to get new machine.   Chronic HFpEF: Stable - Was given IV lasix x1, though acute decompensation not suspected at this time. Will continue to hold torsemide and losartan (said to be on hold PTA anyway) - Restarted metoprolol  - BP normotensive, holding norvasc for now.  Paroxysmal atrial fibrillation: [facility], has had multiple cardioversions, previously on amiodarone. Currently rhythm controlled. - Continue eliquis - Continue flecainide for rhythm control, metoprolol  for rate control. Currently in NSR/1st deg AVB - Continue telemetry monitoring for now.  Stage II CKD: CrCl is currently 56m/min and continuing to improve, so previously cited stage IIIb no longer appears accurate.  - Avoid nephrotoxins.   History of right parietal embolic CVA: With imaging showing encephalomalacia and EEG showing focal findings.  - Remain on anticoagulation  - Continue statin  Elevated LFTs: No focal hepatic abnormality on CT, no biliary dilatation.  - Mild and improving.   Acute hypoxic respiratory failure: Unclear etiology, possible hypoventilation due to encephalopathy. CXR at admission showed atelectasis vs./favored over infiltrate without pulmonary edema. PE unlikely with chronic anticoagulation. This has resolved, so no further work up is currently planned.  HLD:  - Continue statin  History of seminoma: S/p radiation.  - Continue clomiphene.  Obesity: Estimated body mass index is 34.56 kg/m as calculated from the following:   Height as of this encounter: 6' (1.829 m).   Weight as of this encounter: 115.6 kg. - Since indication for ozempic is not diabetes, will hold weekly dose while admitted.  Subjective: No further left hand issues, got up and walked in the halls yesterday after continuous EEG discontinued. No fevers, neck pain or stiffness. Stable lower back pain.   Objective: Vitals:   10/24/21 0007 10/24/21 0336 10/24/21 0729 10/24/21 1205  BP: 101/61 115/69 110/64 115/72  Pulse: 73 72 73 74  Resp: 17 18 19 19   Temp: 97.7 F (36.5 C) 97.8 F (36.6 C) 98.4 F (36.9 C) 98.7 F (37.1 C)  TempSrc: Oral Skin Oral Oral  SpO2: 96% 92% 95% 96%  Weight:      Height:       Gen: 56y.o. male in no distress Pulm: Nonlabored breathing room air. Clear. CV: Regular rate and rhythm. No murmur, rub, or gallop. No JVD, no dependent edema. GI: Abdomen soft, non-tender, less distended, with normoactive bowel sounds.  Ext: Warm, no deformities Skin: No  rashes, lesions or ulcers on visualized skin. Neuro: Alert and oriented. No focal neurological deficits. Psych: Judgement and insight appear fair. Mood euthymic & affect congruent. Behavior is appropriate.    Data Personally reviewed: CBC: Recent Labs  Lab 10/19/21 1634 10/20/21 0437 10/21/21 0602 10/22/21 0616 10/23/21 0244 10/24/21 0453  WBC 6.9 7.1 7.7 6.5 5.8 5.6  NEUTROABS 4.3  --   --  4.3  --   --   HGB 13.0 13.6 12.5* 13.5 12.8* 12.8*  HCT 39.5 41.1 37.2* 40.8 38.5* 39.2  MCV 92.7 93.6 93.9 96.0 94.6 95.6  PLT 229 261 237 240 294 3149  Basic Metabolic Panel: Recent Labs  Lab 10/19/21 0511 10/20/21 0437 10/21/21 0602 10/22/21 0616 10/23/21 0244 10/24/21 0453  NA 138 139 137 134* 135 134*  K 4.0 4.3 4.5 4.4 4.1 4.5  CL 105 108 108 103 105 106  CO2 26 21* 22 22 23  20*  GLUCOSE 92 106* 93 91 96 97  BUN 33* 30* 20 16 17 15   CREATININE 1.68* 1.59* 1.43* 1.48* 1.39* 1.36*  CALCIUM 9.2 9.6 9.5 9.6 9.4 9.3  MG 1.9 2.1  --   --   --   --    GFR: Estimated Creatinine Clearance: 82.5 mL/min (A) (by C-G formula based on SCr of 1.36 mg/dL (H)). Liver Function Tests: Recent Labs  Lab 10/20/21 0437 10/22/21 0616 10/23/21 0244  AST 55* 36 33  ALT 69* 56* 50*  ALKPHOS 68 74 84  BILITOT 1.4* 0.9 0.8  PROT 7.3 6.6 6.3*  ALBUMIN 3.3* 3.0* 3.0*   Anemia Panel: Recent Labs  10/23/21 0244  VITAMINB12 2,486*   Urine analysis:    Component Value Date/Time   COLORURINE YELLOW (A) 10/15/2021 1221   APPEARANCEUR CLEAR (A) 10/15/2021 1221   APPEARANCEUR Clear 09/29/2021 0912   LABSPEC 1.012 10/15/2021 1221   PHURINE 6.0 10/15/2021 1221   GLUCOSEU 50 (A) 10/15/2021 1221   HGBUR MODERATE (A) 10/15/2021 1221   BILIRUBINUR NEGATIVE 10/15/2021 1221   BILIRUBINUR Negative 09/29/2021 0912   KETONESUR NEGATIVE 10/15/2021 1221   PROTEINUR 30 (A) 10/15/2021 1221   NITRITE NEGATIVE 10/15/2021 1221   LEUKOCYTESUR NEGATIVE 10/15/2021 1221   Recent Results (from the past  240 hour(s))  Resp Panel by RT-PCR (Flu A&B, Covid) Anterior Nasal Swab     Status: None   Collection Time: 10/15/21 10:23 AM   Specimen: Anterior Nasal Swab  Result Value Ref Range Status   SARS Coronavirus 2 by RT PCR NEGATIVE NEGATIVE Final    Comment: (NOTE) SARS-CoV-2 target nucleic acids are NOT DETECTED.  The SARS-CoV-2 RNA is generally detectable in upper respiratory specimens during the acute phase of infection. The lowest concentration of SARS-CoV-2 viral copies this assay can detect is 138 copies/mL. A negative result does not preclude SARS-Cov-2 infection and should not be used as the sole basis for treatment or other patient management decisions. A negative result may occur with  improper specimen collection/handling, submission of specimen other than nasopharyngeal swab, presence of viral mutation(s) within the areas targeted by this assay, and inadequate number of viral copies(<138 copies/mL). A negative result must be combined with clinical observations, patient history, and epidemiological information. The expected result is Negative.  Fact Sheet for Patients:  EntrepreneurPulse.com.au  Fact Sheet for Healthcare Providers:  IncredibleEmployment.be  This test is no t yet approved or cleared by the [place] FDA and  has been authorized for detection and/or diagnosis of SARS-CoV-2 by FDA under an Emergency Use Authorization (EUA). This EUA will remain  in effect (meaning this test can be used) for the duration of the COVID-19 declaration under Section 564(b)(1) of the Act, 21 U.S.C.section 360bbb-3(b)(1), unless the authorization is terminated  or revoked sooner.       Influenza A by PCR NEGATIVE NEGATIVE Final   Influenza B by PCR NEGATIVE NEGATIVE Final    Comment: (NOTE) The Xpert Xpress SARS-CoV-2/FLU/RSV plus assay is intended as an aid in the diagnosis of influenza from Nasopharyngeal swab specimens and should not  be used as a sole basis for treatment. Nasal washings and aspirates are unacceptable for Xpert Xpress SARS-CoV-2/FLU/RSV testing.  Fact Sheet for Patients: EntrepreneurPulse.com.au  Fact Sheet for Healthcare Providers: IncredibleEmployment.be  This test is not yet approved or cleared by the [place] FDA and has been authorized for detection and/or diagnosis of SARS-CoV-2 by FDA under an Emergency Use Authorization (EUA). This EUA will remain in effect (meaning this test can be used) for the duration of the COVID-19 declaration under Section 564(b)(1) of the Act, 21 U.S.C. section 360bbb-3(b)(1), unless the authorization is terminated or revoked.  Performed at [facility], [address] [place], [place] [address]   Blood Culture (routine x 2)     Status: None   Collection Time: 10/15/21 10:23 AM   Specimen: BLOOD  Result Value Ref Range Status   Specimen Description BLOOD LEFT ANTECUBITAL  Final   Special Requests   Final    BOTTLES DRAWN AEROBIC AND ANAEROBIC Blood Culture adequate volume   Culture   Final    NO GROWTH 5 DAYS Performed at  [facility], [address], [place], [place] [address]    Report Status 10/20/2021 FINAL  Final  Urine Culture     Status: None   Collection Time: 10/15/21 12:21 PM   Specimen: In/Out Cath Urine  Result Value Ref Range Status   Specimen Description   Final    IN/OUT CATH URINE Performed at [facility], [address] [place], [place] [address]    Special Requests   Final    NONE Performed at [facility], [address] [place], [place] [address]    Culture   Final    NO GROWTH Performed at [facility], [place] [address] [place], [place] [address]    Report Status 10/16/2021 FINAL  Final  CSF culture w Gram Stain     Status: None   Collection Time: 10/15/21  4:57 PM   Specimen: CSF; Cerebrospinal Fluid  Result Value Ref Range Status    Specimen Description   Final    CSF Performed at [facility], [address], [place], [place] [address]    Special Requests   Final    NONE Performed at [facility], [address] [place], [place] [address]    Gram Stain   Final    WBC SEEN RED BLOOD CELLS PRESENT NO ORGANISMS SEEN Performed at [facility], [address] [place], [place] [address]    Culture   Final    NO GROWTH 3 DAYS Performed at [facility], [place] [address], [place], [place] [address]    Report Status 10/19/2021 FINAL  Final  Culture, fungus without smear     Status: None (Preliminary result)   Collection Time: 10/15/21  4:58 PM   Specimen: CSF; Cerebrospinal Fluid  Result Value Ref Range Status   Specimen Description   Final    CSF Performed at [facility], [address], [place], [place] [address]    Special Requests   Final    NONE Performed at [facility], [address] [place], [place] [address]    Culture   Final    NO FUNGUS ISOLATED AFTER 8 DAYS Performed at [facility], [place] [address] [place], [place] [address]    Report Status PENDING  Incomplete  Culture, blood (Routine X 2) w Reflex to ID Panel     Status: None   Collection Time: 10/17/21  6:01 PM   Specimen: BLOOD LEFT ARM  Result Value Ref Range Status   Specimen Description BLOOD LEFT ARM  Final   Special Requests   Final    BOTTLES DRAWN AEROBIC AND ANAEROBIC BACTEROIDES CACCAE   Culture   Final    NO GROWTH 5 DAYS Performed at [facility], [address], [place], [place] [address]    Report Status 10/22/2021 FINAL  Final  Culture, blood (Routine X 2) w Reflex to ID Panel     Status: None   Collection Time: 10/19/21  5:11 AM   Specimen: BLOOD  Result Value Ref Range Status   Specimen Description BLOOD RIGHT [facility]  Final   Special Requests   Final    BOTTLES DRAWN AEROBIC AND ANAEROBIC Blood Culture adequate volume   Culture   Final    NO  GROWTH 5 DAYS Performed at [facility], [address] [place], [place] [address]    Report Status 10/24/2021 FINAL  Final  Culture, blood (Routine X 2) w Reflex to ID  Panel     Status: None   Collection Time: 10/19/21  5:11 AM   Specimen: BLOOD  Result Value Ref Range Status   Specimen Description BLOOD RIGHT HAND  Final   Special Requests   Final    BOTTLES DRAWN AEROBIC AND ANAEROBIC Blood Culture results may not be optimal due to an inadequate volume of blood received in culture bottles   Culture   Final    NO GROWTH 5 DAYS Performed at [facility], [address] [place], [place] [address]    Report Status 10/24/2021 FINAL  Final  CSF culture w Gram Stain     Status: None (Preliminary result)   Collection Time: 10/22/21  1:52 PM   Specimen: PATH Cytology CSF; Cerebrospinal Fluid  Result Value Ref Range Status   Specimen Description CSF  Final   Special Requests NONE  Final   Gram Stain   Final    WBC PRESENT, PREDOMINANTLY MONONUCLEAR NO ORGANISMS SEEN CYTOSPIN SMEAR    Culture   Final    NO GROWTH 2 DAYS Performed at [facility], [place] [address], [place], [place] [address]    Report Status PENDING  Incomplete     DG FL GUIDED LUMBAR PUNCTURE  Result Date: 10/22/2021 CLINICAL DATA:  Seizures, encephalopathy, recent fevers. EXAM: LUMBAR PUNCTURE UNDER FLUOROSCOPY PROCEDURE: Due to the patient's altered mental status, Narda Rutherford, NP obtained informed consent from the patient's wife Mandrell Vangilder) via telephone prior to the procedure. This process included a discussion of procedural risks. An appropriate skin entry site was determined under fluoroscopy and marked. A time-out was performed. The operator donned sterile gloves and a mask. The skin entry site was prepped with Betadine, draped in the usual sterile fashion and infiltrated locally with 1% lidocaine. Under fluoroscopic guidance, a 20 gauge spinal needle was advanced into the thecal sac at the  L4-L5 level. There was spontaneous return of clear CSF with an opening pressure of 20 cm water. 16 mL of CSF were collected for laboratory studies. The inner stylet was placed back in the needle and the needle was removed in its entirety. A dressing was applied to the skin entry site. The patient tolerated the procedure well. No immediate post-procedure complication was apparent. The procedure was performed by Narda Rutherford, NP, and was supervise interpreted by Kellie Simmering, D.O. FLUOROSCOPY: Fluoroscopy time: 12 seconds (1.90 mGy). IMPRESSION: Technically successful fluoroscopically-guided L4-L5 lumbar puncture. Opening pressure: 20 cm water. 16 mL of CSF obtained for laboratory studies. Electronically Signed   By: Kellie Simmering D.O.   On: 10/22/2021 14:58    Family Communication: None at bedside  Disposition: Status is: Inpatient Remains inpatient appropriate because: Pending further work up results. Will also need confirmation of pharmacy availability of medications (phenobarbital and valganciclovir) prior to discharge. Planned Discharge Destination: Home likely 8/7.   Patrecia Pour, MD 10/24/2021 12:10 PM Page by [email or web address]

## 2021-10-25 ENCOUNTER — Telehealth (HOSPITAL_COMMUNITY): Payer: Self-pay | Admitting: Pharmacy Technician

## 2021-10-25 ENCOUNTER — Other Ambulatory Visit (HOSPITAL_COMMUNITY): Payer: Self-pay

## 2021-10-25 LAB — CBC
HCT: 36.6 % — ABNORMAL LOW (ref 39.0–52.0)
Hemoglobin: 12.3 g/dL — ABNORMAL LOW (ref 13.0–17.0)
MCH: 32 pg (ref 26.0–34.0)
MCHC: 33.6 g/dL (ref 30.0–36.0)
MCV: 95.3 fL (ref 80.0–100.0)
Platelets: 308 10*3/uL (ref 150–400)
RBC: 3.84 MIL/uL — ABNORMAL LOW (ref 4.22–5.81)
RDW: 14.5 % (ref 11.5–15.5)
WBC: 5.3 10*3/uL (ref 4.0–10.5)
nRBC: 0 % (ref 0.0–0.2)

## 2021-10-25 LAB — CSF CULTURE W GRAM STAIN: Culture: NO GROWTH

## 2021-10-25 LAB — CYTOLOGY - NON PAP

## 2021-10-25 MED ORDER — GABAPENTIN 300 MG PO CAPS
300.0000 mg | ORAL_CAPSULE | Freq: Every day | ORAL | Status: DC
Start: 2021-10-25 — End: 2021-10-25

## 2021-10-25 MED ORDER — LEVETIRACETAM 750 MG PO TABS
1500.0000 mg | ORAL_TABLET | Freq: Two times a day (BID) | ORAL | 0 refills | Status: AC
  Filled 2021-10-25: qty 120, 30d supply, fill #0

## 2021-10-25 MED ORDER — GABAPENTIN 300 MG PO CAPS
600.0000 mg | ORAL_CAPSULE | Freq: Every day | ORAL | Status: DC
  Administered 2021-10-25: 600 mg via ORAL
  Filled 2021-10-25: qty 2

## 2021-10-25 MED ORDER — PHENOBARBITAL 16.2 MG PO TABS
ORAL_TABLET | ORAL | 0 refills | Status: DC
  Filled 2021-10-25: qty 6, 4d supply, fill #0

## 2021-10-25 MED ORDER — VALGANCICLOVIR HCL 450 MG PO TABS
900.0000 mg | ORAL_TABLET | Freq: Two times a day (BID) | ORAL | 0 refills | Status: DC
  Filled 2021-10-25: qty 14, 4d supply, fill #0

## 2021-10-25 MED ORDER — LACOSAMIDE 100 MG PO TABS
100.0000 mg | ORAL_TABLET | Freq: Two times a day (BID) | ORAL | 0 refills | Status: AC
  Filled 2021-10-25: qty 60, 30d supply, fill #0

## 2021-10-25 NOTE — TOC Benefit Eligibility Note (Signed)
Patient Teacher, English as a foreign language completed.    The patient is currently admitted and upon discharge could be taking valganciclovir (Valcyte) 450 mg tablets.  The current 4 day co-pay is $10.00.   The patient is insured through [place] Patient Advocate Specialist [place] Patient Advocate Team Direct Number: [phone]  Fax: [phone]

## 2021-10-25 NOTE — TOC Transition Note (Signed)
Transition of Care [facility]) - CM/SW Discharge Note   Patient Details  Name: Dustin Wells MRN: [id number] Date of Birth: [birth year]  Transition of Care [facility] CM/SW Contact:  Pollie Friar, RN Phone Number: 10/25/2021, 11:47 AM   Clinical Narrative:    Patient is discharging home with outpatient therapy through [facility]. Information on the AVS.  Walker for home to be delivered to the room per [employer]. Pt has transportation home.    Final next level of care: OP Rehab Barriers to Discharge: No Barriers Identified   Patient Goals and CMS Choice     Choice offered to / list presented to : Patient  Discharge Placement                       Discharge Plan and Services                DME Arranged: Walker rolling DME Agency: [employer] Date DME Agency Contacted: 10/25/21   Representative spoke with at DME Agency: [facility]            Social Determinants of Health ([place]) Interventions     Readmission Risk Interventions     No data to display

## 2021-10-25 NOTE — Progress Notes (Signed)
[place] for Infectious Disease    Date of Admission:  10/20/2021   Total days of antibiotics 9   ID: Dustin Wells is a 56 y.o. male with  HHV-6 meningitis Principal Problem:   Encephalitis Active Problems:   Alcohol use disorder, severe, dependence (HCC)   Stage 3b chronic kidney disease (CKD) (HCC)   Atrial fibrillation, chronic (HCC)   Sepsis (HCC)   Thrombocytopenia (HCC)   HTN (hypertension)   HLD (hyperlipidemia)   Obesity with body mass index of 30.0-39.9   Testicular cancer (HCC)   Chronic diastolic CHF (congestive heart failure) (HCC)   Abdominal distention   Acute respiratory failure with hypoxia (HCC)   Seizure (HCC)    Subjective: Afebrile, feeling better, planning to go home today; left forearm PIV infiltration overnight  Medications:   allopurinol  300 mg Oral Daily   apixaban  5 mg Oral BID   atorvastatin  40 mg Oral Daily   clomiPHENE  25 mg Oral QHS   cyanocobalamin  1,000 mcg Oral Daily   flecainide  100 mg Oral J62G   folic acid  1 mg Oral Daily   gabapentin  600 mg Oral QHS   lacosamide  100 mg Oral BID   levETIRAcetam  1,500 mg Oral BID   metoprolol succinate  25 mg Oral Daily   multivitamin with minerals  1 tablet Oral Daily   [START ON 10/26/2021] phenobarbital  32.4 mg Oral Q1200   Followed by   Derrill Memo ON 10/28/2021] phenobarbital  16.2 mg Oral Q1200   sodium bicarbonate  650 mg Oral BID   thiamine  100 mg Oral Daily   Or   thiamine  100 mg Intravenous Daily    Objective: Vital signs in last 24 hours: Temp:  [97.6 F (36.4 C)-98.3 F (36.8 C)] 98.3 F (36.8 C) (08/07 1130) Pulse Rate:  [70-85] 85 (08/07 1130) Resp:  [16-19] 18 (08/07 1130) BP: (107-136)/(76-87) 110/76 (08/07 1130) SpO2:  [94 %-100 %] 100 % (08/07 1130)  Physical Exam  Constitutional: He is oriented to person, place, and time. He appears well-developed and well-nourished. No distress.  HENT:  Mouth/Throat: Oropharynx is clear and moist. No oropharyngeal  exudate.  Cardiovascular: Normal rate, regular rhythm and normal heart sounds. Exam reveals no gallop and no friction rub.  No murmur heard.  Pulmonary/Chest: Effort normal and breath sounds normal. No respiratory distress. He has no wheezes.  Abdominal: Soft. Bowel sounds are normal. He exhibits no distension. There is no tenderness.  Lymphadenopathy:  He has no cervical adenopathy.  Neurological: He is alert and oriented to person, place, and time.  Skin: Skin is warm and dry. No rash noted. No erythema. Small induration to left forearm no cellulitis, bruising noted Psychiatric: He has a normal mood and affect. His behavior is normal.    Lab Results Recent Labs    10/23/21 0244 10/24/21 0453 10/25/21 0612  WBC 5.8 5.6 5.3  HGB 12.8* 12.8* 12.3*  HCT 38.5* 39.2 36.6*  NA 135 134*  --   K 4.1 4.5  --   CL 105 106  --   CO2 23 20*  --   BUN 17 15  --   CREATININE 1.39* 1.36*  --    Liver Panel Recent Labs    10/23/21 0244  PROT 6.3*  ALBUMIN 3.0*  AST 33  ALT 50*  ALKPHOS 84  BILITOT 0.8   Sedimentation Rate No results for input(s): "ESRSEDRATE" in the last 72 hours.  C-Reactive Protein No results for input(s): "CRP" in the last 72 hours.  Microbiology:  Studies/Results: No results found.   Assessment/Plan: HHV-6 meningitis= plan to finish out his course of antiviral with 3 addn days of treatment to complete 12 days, since much improved Plan to switch to valcyte '900mg'$  po bid  Acute on chronic kidney disease= appears improving, doesn't need dose adjustment on valcyte   Seizure = appears stable on current regimen. Continue as prescribed -would benefit to follow up with epilepsy specialist  Will sign off.  [facility] for Infectious Diseases Pager: [phone]  10/25/2021, 2:21 PM

## 2021-10-25 NOTE — Discharge Instructions (Signed)
Per [facility] statutes, patients with seizures are not allowed to drive until they have been seizure-free for six months. Use caution when using heavy equipment or power tools. Avoid working on ladders or at heights. Take showers instead of baths. Ensure the water temperature is not too high on the home water heater. Do not go swimming alone. Do not lock yourself in a room alone (i.e. bathroom). When caring for infants or small children, sit down when holding, feeding, or changing them to minimize risk of injury to the child in the event you have a seizure. Maintain good sleep hygiene. Avoid alcohol.      If Dustin Wells has another seizure, call 911 and bring them back to the ED if:       A.  The seizure lasts longer than 5 minutes.            B.  The patient doesn't wake shortly after the seizure or has new problems such as difficulty seeing, speaking or moving following the seizure       C.  The patient was injured during the seizure       D.  The patient has a temperature over 102 F (39C)       E.  The patient vomited during the seizure and now is having trouble breathing

## 2021-10-25 NOTE — Telephone Encounter (Signed)
Pharmacy Patient Advocate Encounter  Insurance verification completed.    The patient is insured through [employer]   The patient is currently admitted and ran test claims for the following: Valganciclovir 450 mg.  Copays and coinsurance results were relayed to Inpatient clinical team.

## 2021-10-25 NOTE — Plan of Care (Signed)

## 2021-10-25 NOTE — Discharge Summary (Signed)
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 Started vimpat, keppra, and will complete phenobarbital taper at discharge. Patient may receive referral from PCP if he wishes, though referral to [facility] was placed at discharge.  Follow up with PCP in next 1-2 weeks for continued management of HTN. Recommend getting fitted for CPAP. Follow up with nephrology as planned. Given normotension and euvolemia while holding norvasc, torsemide, losartan, patient has been advised not to take these until follow up  Discharge Diagnoses: Principal Problem:   Encephalitis Active Problems:   Seizure ([place])   Sepsis ([place])   Acute respiratory failure with hypoxia ([place])   Alcohol use disorder, severe, dependence (HCC)   Atrial fibrillation, chronic (HCC)   Chronic diastolic CHF (congestive heart failure) (HCC)   Stage 2 chronic kidney disease (CKD) ([place])   HTN (hypertension)   Testicular cancer ([place])   HLD (hyperlipidemia)   Thrombocytopenia (HCC)   Abdominal distention   Obesity with body mass index of 30.0-39.9  Hospital Course: Dustin Wells is a 56 y.o. male with a history of stage II seminoma s/p XRT, right parietal CVA, alcohol abuse, HTN, prediabetes, HLD, AFib, chronic HFpEF, obesity, OSA on CPAP, thrombocytopenia who presented initially to [facility] 7/26 with presyncope and hypotension improved with IVF, though was febrile prior to discharge, and sent home only to return 7/28 with continued back pain, neck pain, confusion, urinary incontinence, abdominal pain and distention, fever. Among other work up, CT abd/pelvis showed splenomegaly and bilateral perinephric stranding. He met sepsis criteria, started on ceftriaxone. ID was consulted,  LP on 7/29 was done for the concern of meningitis, showed a neutrophilic pleocytosis (WBC 804, 70% PMNs), protein 105, and HHV-6 positive. Antibiotics were broadened, a single dose of decadron was given, and acyclovir given empirically initially. This was changed to ganciclovir, and empiric antibiotics ultimately were stopped. To date, blood and CSF cultures have remained negative. CSF cultures negative. MRI brain revealed stable encephalomalacia of right posterior temporal and parietal lobe. Neurology was consulted, noting he had several episodes per day of uncontrollable left hand spasms and started keppra. Additional AEDs added due to persistent seizure activity, prompting transfer to [facility] on 8/2 for continuous EEG. Phenobarbital has been added with planned taper. Due to diagnostic uncertainty, repeat LP was performed 8/4 still showing +HHV-6 with decreased WBCs with lymphocytic predominance. EEG continued to show right parietal occipital epileptogenicity but overall symptoms resolved and remained resolved on AED regimen. His mental status has normalized and he's working well with PT and OT. ID and neurology are in agreement with discharge on AEDs and to complete antiviral treatment at home.   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 - HHV-6 quantitative analysis remains pending from initial LP, also sent with repeat on 8/4. Cell count has declined (804 > 32 and now with lymphocytic predominance (92%). Will continue ganciclovir, follow up pending tests. Would plan to discharge on oral equivalent 8/7 if stable. - Antibiotics have been stopped. No growth of any CSF culture (7/28, NGTD, 8/4 NGTD) nor blood cultures (7/28 NGTD, 8/1 NGTD) - ID consulted, recommends completing Tx w/valganciclovir. Copay confirmed to be affordable, sent to [facility] pharmacy to be provided prior to discharge.    Focal  seizures with resolved status epilepticus: New onset due to underlying stroke.  - Neurology  recommendations appreciated, continuing vimpat 112m BID, keppra 1,5062mBID, and planned phenobarbital taper. Per neurology, [facility], or take referral from [place]CP [place] - Seizure precautions reviewed in detail   Acute metabolic encephalopathy: Also likely due to seizures/post-ictal. Resolved. Note RPR, B12, ammonia, TSH wnl.   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 Currently resolved.   Bilateral perinephric stranding: With prominent urinary symptoms PTA, though no WBCs on urinalysis. There were RBCs, so ?passed stone(s). Pyelonephritis is felt to be ruled out. - ID recommended stopping antibiotics after 4 days of treatment. We will plan to continue monitoring off antibiotics. Urinary symptoms have resolved.   Alcohol abuse: EtOH negative at admission. - No current evidence of withdrawal.  - Cessation counseling provided.    Splenomegaly, thrombocytopenia: Chronic. Platelets have normalized over past few days.   OSA:  - CPAP qHS advised (hasn't been using) - Will follow up for reevaluation as outpatient to get new machine.    Chronic HFpEF: Stable - Was given IV lasix x1, though acute decompensation not suspected at this time. Will continue to hold torsemide and losartan (said to be on hold PTA anyway) - Restarted metoprolol  - BP normotensive, holding norvasc for now.   Paroxysmal atrial fibrillation: [facility], has had multiple cardioversions, previously on amiodarone. Currently rhythm controlled. - Continue eliquis - Continue flecainide for rhythm control, metoprolol for rate control. Currently in NSR/1st deg AVB   Stage II CKD: CrCl is currently 7835min and continuing to improve, so previously cited stage IIIb no longer appears accurate.  - Avoid nephrotoxins.    History of right parietal embolic CVA: With imaging showing encephalomalacia and EEG showing  focal findings.  - Remain on anticoagulation  - Continue statin   Elevated LFTs: No focal hepatic abnormality on CT, no biliary dilatation.  - Mild and improving.    Acute hypoxic respiratory failure: Unclear etiology, possible hypoventilation due to encephalopathy. CXR at admission showed atelectasis vs./favored over infiltrate without pulmonary edema. PE unlikely with chronic anticoagulation. This has resolved, so no further work up is currently planned.   HLD:  - Continue statin   History of seminoma: S/p radiation.  - Continue clomiphene.   Obesity: Estimated body mass index is 34.56 kg/m   Consultants: Neurology, ID Procedures performed: Continuous EEG. Lumbar puncture x2  Disposition: Home Diet recommendation:  Discharge Diet Orders (From admission, onward)     Start     Ordered   10/25/21 0000  Diet - low sodium heart healthy        10/25/21 1302           Cardiac diet DISCHARGE MEDICATION: Allergies as of 10/25/2021       Reactions   Rivaroxaban Diarrhea, Nausea Only, Other (See Comments)   Nausea, diarrhea, abdominal pain Nausea, diarrhea, abdominal pain Nausea, diarrhea, abdominal pain   Sulfamethoxazole-trimethoprim Rash        Medication List     STOP taking these medications    amLODipine 5 MG tablet Commonly known as: NORVASC   ganciclovir 290 mg in sodium chloride 0.9 % 100 mL   multivitamin with minerals Tabs tablet   thiamine 100 MG tablet Commonly known as: VITAMIN B1   thiamine 100 MG/ML injection Commonly known as: VITAMIN B1   torsemide 10 MG tablet Commonly known as: DEMADEX  TAKE these medications    acetaminophen 325 MG tablet Commonly known as: TYLENOL Take 650 mg by mouth every 6 (six) hours as needed for mild pain.   allopurinol 300 MG tablet Commonly known as: ZYLOPRIM Take 300 mg by mouth daily.   atorvastatin 40 MG tablet Commonly known as: LIPITOR Take 40 mg by mouth daily.   Cholecalciferol 25 MCG  (1000 UT) tablet Take 1,000 Units by mouth daily.   clomiPHENE 50 MG tablet Commonly known as: CLOMID Take 1/2 tablet daily What changed:  how much to take how to take this when to take this   cyanocobalamin 1000 MCG tablet Take 1,000 mcg by mouth daily.   Eliquis 5 MG Tabs tablet Generic drug: apixaban Take 5 mg by mouth 2 (two) times daily.   flecainide 100 MG tablet Commonly known as: TAMBOCOR Take 100 mg by mouth every 12 (twelve) hours.   folic acid 1 MG tablet Commonly known as: FOLVITE Take by mouth.   gabapentin 300 MG capsule Commonly known as: NEURONTIN Take 1-3 tabs at night   Lacosamide 100 MG Tabs Take 1 tablet (100 mg total) by mouth 2 (two) times daily.   levETIRAcetam 750 MG tablet Commonly known as: KEPPRA Take 2 tablets (1,500 mg total) by mouth 2 (two) times daily.   LORazepam 2 MG tablet Commonly known as: ATIVAN Take 2 mg by mouth at bedtime as needed.   losartan 100 MG tablet Commonly known as: COZAAR Take 1 tablet (100 mg total) by mouth daily. Hold until you see your nephrologist   magnesium oxide 400 (240 Mg) MG tablet Commonly known as: MAG-OX Take 1 tablet by mouth 2 (two) times daily.   metoprolol succinate 100 MG 24 hr tablet Commonly known as: TOPROL-XL Take 1 tablet (100 mg total) by mouth daily. hold   Ozempic (2 MG/DOSE) 8 MG/3ML Sopn Generic drug: Semaglutide (2 MG/DOSE) Inject 2 mg into the skin once a week. Monday   pantoprazole 40 MG tablet Commonly known as: PROTONIX Take 40 mg by mouth daily as needed (acid reflux).   phenobarbital 16.2 MG tablet Commonly known as: LUMINAL Take 2 tablets (32.4 mg total) by mouth daily for 2 days, THEN 1 tablet (16.2 mg total) daily for 2 days. Start taking on: October 25, 2021   sildenafil 20 MG tablet Commonly known as: REVATIO Take 3-5 tablets 1 hr prior to intercourse as needed What changed:  how much to take how to take this when to take this reasons to take this    sodium bicarbonate 650 MG tablet Take 650 mg by mouth 2 (two) times daily.   valGANciclovir 450 MG tablet Commonly known as: VALCYTE Take 2 tablets (900 mg total) by mouth 2 (two) times daily.               Durable Medical Equipment  (From admission, onward)           Start     Ordered   10/25/21 1114  For home use only DME Walker rolling  Once       Question Answer Comment  Walker: With 5 Inch Wheels   Patient needs a walker to treat with the following condition Weakness      10/25/21 1113            Follow-up Information     [facility] Follow up.   Specialty: Rehabilitation Why: The outpatient rehab will contact you for the first appointment Contact information:  [place] [phone] [address] [place] [phone]        Leonel Ramsay, MD Follow up.   Specialty: Infectious Diseases Contact information: [place] [address] [phone]                Discharge Exam: Dustin Wells Weights   10/22/21 0800  Weight: 115.6 kg  BP 110/76 (BP Location: Right Arm)   Pulse 85   Temp 98.3 F (36.8 C) (Oral)   Resp 18   Ht 6' (1.829 m) Comment: recorded on 10/19/21  Wt 115.6 kg Comment: recorded on 10/19/21  SpO2 100%   BMI 34.56 kg/m   Obese male in no distress Clear, nonlabored RRR without edema Alert, oriented, no dysarthria, very subtle left grip strength weakness, no tremor or new deficits.  Condition at discharge: stable  The results of significant diagnostics from this hospitalization (including imaging, microbiology, ancillary and laboratory) are listed below for reference.   Imaging Studies: DG FL GUIDED LUMBAR PUNCTURE  Result Date: 10/22/2021 CLINICAL DATA:  Seizures, encephalopathy, recent fevers. EXAM: LUMBAR PUNCTURE UNDER FLUOROSCOPY PROCEDURE: Due to the patient's altered mental status, Narda Rutherford, NP obtained informed consent from  the patient's wife Dustin Wells) via telephone prior to the procedure. This process included a discussion of procedural risks. An appropriate skin entry site was determined under fluoroscopy and marked. A time-out was performed. The operator donned sterile gloves and a mask. The skin entry site was prepped with Betadine, draped in the usual sterile fashion and infiltrated locally with 1% lidocaine. Under fluoroscopic guidance, a 20 gauge spinal needle was advanced into the thecal sac at the L4-L5 level. There was spontaneous return of clear CSF with an opening pressure of 20 cm water. 16 mL of CSF were collected for laboratory studies. The inner stylet was placed back in the needle and the needle was removed in its entirety. A dressing was applied to the skin entry site. The patient tolerated the procedure well. No immediate post-procedure complication was apparent. The procedure was performed by Narda Rutherford, NP, and was supervise interpreted by Kellie Simmering, D.O. FLUOROSCOPY: Fluoroscopy time: 12 seconds (1.90 mGy). IMPRESSION: Technically successful fluoroscopically-guided L4-L5 lumbar puncture. Opening pressure: 20 cm water. 16 mL of CSF obtained for laboratory studies. Electronically Signed   By: Kellie Simmering D.O.   On: 10/22/2021 14:58   Overnight EEG with video  Result Date: 10/21/2021 Lora Havens, MD     10/22/2021 10:24 AM Patient Name: Dustin Wells MRN: [id number] Epilepsy Attending: Lora Havens Referring Physician/Provider: Donnetta Simpers, MD Duration: 10/20/2021 1957 to 10/21/2021 1957  Patient history: 56 year old male with altered mental status.  EEG to evaluate for seizure.  Level of alertness: awake, asleep  AEDs during EEG study: LEV, LCM  Technical aspects: This EEG study was done with scalp electrodes positioned according to the 10-20 International system of electrode placement. Electrical activity was reviewed with band pass filter of 1-_0 , sensitivity of 7 uV/mm, display speed of  35m/sec with a _1  notched filter applied as appropriate. EEG data were recorded continuously and digitally stored.  Video monitoring was available and reviewed as appropriate.  Description:  The posterior dominant rhythm consists of 9-10 Hz activity of moderate voltage (25-35 uV) seen predominantly in posterior head regions, symmetric and reactive to eye opening and eye closing.  Sleep was characterized by sleep spindles (12 to 14 Hz), maximal frontocentral region. Periodic discharges with overriding fast activity were noted in right parieto-occipital region  which at times appeared rhythmic lasting 3-8 seconds consistent with brief-ictal-interictal rhythmic discharges. Hyperventilation and photic stimulation were not performed.    ABNORMALITY - Brief-ictal-interictal rhythmic discharges, right parieto-occipital region  IMPRESSION: This study showed evidence of epileptogenicity arising from right parietal occipital region with increased risk of seizure recurrence.  No definite seizures were seen during the study.  Lora Havens   EEG adult  Result Date: 10/20/2021 Lora Havens, MD     10/20/2021  4:26 PM Patient Name: Dustin Wells MRN: [id number] Epilepsy Attending: Lora Havens Referring Physician/Provider: Lorenza Chick, MD Date: 10/20/2021 Duration: 21.57 mins  Patient history: 56 year old male with altered mental status.  EEG to evaluate for seizure.  Level of alertness: Awake, asleep  AEDs during EEG study: LEV  Technical aspects: This EEG study was done with scalp electrodes positioned according to the 10-20 International system of electrode placement. Electrical activity was reviewed with band pass filter of 1-_0 , sensitivity of 7 uV/mm, display speed of 31m/sec with a _1  notched filter applied as appropriate. EEG data were recorded continuously and digitally stored.  Video monitoring was available and reviewed as appropriate.  Description: The posterior dominant rhythm consists of 9-10 Hz  activity of moderate voltage (25-35 uV) seen predominantly in posterior head regions, symmetric and reactive to eye opening and eye closing.  Sleep was characterized by sleep spindles (12 to 14 Hz), maximal frontocentral region.  Periodic discharges with overriding fast activity were noted in right parieto-occipital region at _2  which at times appeared rhythmic lasting 3-9 seconds consistent with brief-ictal-interictal rhythmic discharges. Five electrographic seizures were also noted during which EEG showed evolution in morphology, frequency and involved vertex region. No clinical signs were noted. Average duration 10-15 seconds.  Last seizure was at 1102. Hyperventilation and photic stimulation were not performed.    ABNORMALITY - Electrographic seizure, right parieto-occipital region - Brief-ictal-interictal rhythmic discharges, right parieto-occipital region - Periodic discharges with overriding fast activity, right parieto-occipital regio  IMPRESSION: This study showed five electrographic seizures arising from right parieto-occipital region, lasting about 10-15 seconds each. There is also evidence of epileptogenicity with increased risk of seizure recurrence.   Dr. BCurly Shoreswas notified. PLora Havens  EEG adult  Result Date: 10/19/2021 YLora Havens MD     10/19/2021  4:36 PM Patient Name: JRoy TokarzMRN: [id number] Attending: PLora HavensReferring Physician/Provider: BLorenza Chick MD Date: 10/19/2021 Duration: 29.57 mins Patient history: 56year old male with altered mental status.  EEG to evaluate for seizure. Level of alertness: Awake AEDs during EEG study: None Technical aspects: This EEG study was done with scalp electrodes positioned according to the 10-20 International system of electrode placement. Electrical activity was reviewed with band pass filter of 1-_3 , sensitivity of 7 uV/mm, display speed of 332msec with a _4  notched filter applied as appropriate. EEG data were  recorded continuously and digitally stored.  Video monitoring was available and reviewed as appropriate. Description: The posterior dominant rhythm consists of 9-10 Hz activity of moderate voltage (25-35 uV) seen predominantly in posterior head regions, symmetric and reactive to eye opening and eye closing. Periodic discharges with overriding fast activity were noted in right parieto-occipital region at _5  which at times appeared rhythmic lasting 2-5 seconds consistent with brief-ictal-interictal rhythmic discharges. Six electrographic seizures were also noted during which EEG showed evolution in morphology, frequency and involved vertex region. No clinical signs were noted. Average duration 30-45 seconds.  Last seizure was at 1546. Hyperventilation and photic stimulation  were not performed.   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 There is also evidence of epileptogenicity with increased risk of seizure recurrence. Consider long term eeg monitoring. Dr. Curly Shores was notified. Lora Havens   MR BRAIN W WO CONTRAST  Result Date: 10/16/2021 CLINICAL DATA:  Meningitis/CNS infection is suspected. Abnormal lumbar puncture. EXAM: MRI HEAD WITHOUT AND WITH CONTRAST TECHNIQUE: Multiplanar, multiecho pulse sequences of the brain and surrounding structures were obtained without and with intravenous contrast. CONTRAST:  34m GADAVIST GADOBUTROL 1 MMOL/ML IV SOLN COMPARISON:  MR head without contrast 10/14/2021. FINDINGS: Brain: Chronic encephalomalacia right posterior temporal and parietal lobe is stable. Volume loss is noted with ex vacuo dilation the right lateral ventricle. Scattered subcortical  T2 hyperintensities are otherwise mildly advanced for age. Left lateral ventricle is within normal limits. No significant extraaxial fluid collection is present. The internal auditory canals are within normal limits. The brainstem and cerebellum are within normal limits. Postcontrast images demonstrate no pathologic enhancement. No focal dural thickening or leptomeningeal enhancement is present. Vascular: Flow is present in the major intracranial arteries. Skull and upper cervical spine: The craniocervical junction is normal. Upper cervical spine is within normal limits. Marrow signal is unremarkable. Sinuses/Orbits: A polyp mucous retention cyst is present posterior right maxillary sinus. Left greater than right mastoid effusions are present. No obstructing nasopharyngeal lesion is present. The paranasal sinuses and mastoid air cells are otherwise clear. IMPRESSION: 1. No acute intracranial abnormality or significant interval change. 2. Stable encephalomalacia of the right posterior temporal and parietal lobe. 3. Scattered subcortical T2 hyperintensities bilaterally are mildly advanced for age. The finding is nonspecific but can be seen in the setting of chronic microvascular ischemia, a demyelinating process such as multiple sclerosis, vasculitis, complicated migraine headaches, or as the sequelae of a prior infectious or inflammatory process. Electronically Signed   By: CSan MorelleM.D.   On: 10/16/2021 19:51   CT ABDOMEN PELVIS WO CONTRAST  Result Date: 10/15/2021 CLINICAL DATA:  Abdominal pain EXAM: CT ABDOMEN AND PELVIS WITHOUT CONTRAST TECHNIQUE: Multidetector CT imaging of the abdomen and pelvis was performed following the standard protocol without IV contrast. RADIATION DOSE REDUCTION: This exam was performed according to the departmental dose-optimization program which includes automated exposure control, adjustment of the mA and/or kV according to patient size and/or use of iterative  reconstruction technique. COMPARISON:  None Available. FINDINGS: Lower chest: Breathing motion artifacts limit evaluation. There is patchy infiltrate in left lower lung field at the left cardiophrenic angle. There are small linear densities in the lateral aspect of left lower lobe. Hepatobiliary: Breathing motion artifacts limit evaluation. As far as seen, no focal abnormalities are noted in the liver. There is no dilation of bile ducts. Gallbladder is not distended. Pancreas: No focal abnormalities are seen. Spleen: Spleen measures 16.5 cm in AP diameter. Adrenals/Urinary Tract: Adrenals are unremarkable. There is no hydronephrosis. There is perinephric stranding around both kidneys which may be related to acute or chronic pyelonephritis or previous ureteric obstruction. There is no loculated perinephric fluid collection. There are no renal or ureteral stones. Urinary bladder is unremarkable. Stomach/Bowel: Small hiatal hernia is seen. Small bowel loops are not dilated. Appendix is not dilated. There is no significant wall thickening in colon. Diverticula are seen in the colon without signs of focal  acute diverticulitis. In image 75 of series 2, there is 1.3 cm soft tissue density structure in the posterior margin of the sigmoid colon. Vascular/Lymphatic: Scattered arterial calcifications are seen. Reproductive: Unremarkable. Other: There is no ascites or pneumoperitoneum. Umbilical hernia containing fat is seen. Small right inguinal hernia containing fat is seen. Musculoskeletal: No acute findings are seen. IMPRESSION: There is no evidence of intestinal obstruction or pneumoperitoneum. There is no hydronephrosis. Appendix is not dilated. There is patchy infiltrate in left lower lung field suggesting atelectasis/pneumonia. There is 1.3 cm soft tissue density structure in sigmoid colon which may suggest fecal material in the lumen or polyp. When the patient's clinical condition permits, endoscopy may be considered.  There is perinephric stranding around both kidneys which may be due to acute or chronic pyelonephritis or residual change from previous ureteric obstruction. Small hiatal hernia.Diverticulosis of colon without signs of focal diverticulitis. Enlarged spleen. Other findings as described in the body of the report. Electronically Signed   By: Elmer Picker M.D.   On: 10/15/2021 17:01   DG Lumbar Puncture Fluoro Guide  Result Date: 10/15/2021 CLINICAL DATA:  Patient presented to ED for significant weakness with fevers and altered mental status. Request for fluoroscopic guided lumbar puncture. EXAM: LUMBAR PUNCTURE UNDER FLUOROSCOPY PROCEDURE: An appropriate skin entry site was determined fluoroscopically. Operator donned sterile gloves and mask. Skin site was marked, then prepped with Betadine, draped in usual sterile fashion, and infiltrated locally with 1% lidocaine. A 22 gauge spinal needle advanced into the thecal sac at L4-L5 from a right interlaminar approach. Clear colorless CSF spontaneously returned. 12 ml CSF were collected and divided among 4 sterile vials for the requested laboratory studies. No opening or closing pressure was obtained. The needle was then removed. The patient tolerated the procedure well and there were no complications. FLUOROSCOPY: Radiation Exposure Index (as provided by the fluoroscopic device): 32.70 mGy Kerma IMPRESSION: Technically successful lumbar puncture under fluoroscopy. This exam was performed by Narda Rutherford, NP, and was supervised and interpreted by Kathreen Devoid, MD. Electronically Signed   By: Kathreen Devoid M.D.   On: 10/15/2021 16:57   CT Head Wo Contrast  Result Date: 10/15/2021 CLINICAL DATA:  Mental status changes, confusion, shaking, difficulty communicating. History of chronic right MCA territory infarct EXAM: CT HEAD WITHOUT CONTRAST TECHNIQUE: Contiguous axial images were obtained from the base of the skull through the vertex without intravenous contrast.  RADIATION DOSE REDUCTION: This exam was performed according to the departmental dose-optimization program which includes automated exposure control, adjustment of the mA and/or kV according to patient size and/or use of iterative reconstruction technique. COMPARISON:  10/14/2021 MR, 10/13/2021 CT FINDINGS: Brain: Remote posterior right MCA territory infarct with encephalomalacia in the right parietal and frontal lobes. Similar ex vacuo dilatation of the right lateral ventricle posteriorly. No acute intracranial hemorrhage, new mass lesion, new infarction, midline shift, herniation, hydrocephalus, or extra-axial fluid collection. No focal mass effect or edema. Cisterns are patent. No cerebellar abnormality. Vascular: No hyperdense vessel or unexpected calcification. Skull: Normal. Negative for fracture or focal lesion. Sinuses/Orbits: No acute finding. Other: None. 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 No acute intracranial abnormality or interval change by noncontrast CT. Electronically Signed   By: Jerilynn Mages.  Shick M.D.   On: 10/15/2021 10:25   DG Chest Port 1 View  Result Date: 10/15/2021 CLINICAL DATA:  Questionable sepsis - evaluate for abnormality EXAM: PORTABLE CHEST - 1 VIEW COMPARISON:  None available FINDINGS: The heart is mildly enlarged. Evaluation  the mediastinum is limited due to patient obliquity. Mild patchy opacities present the right lung base likely due to atelectasis. Lungs otherwise clear. IMPRESSION: 1. Exam limited due to patient rotation. Consider repeating two view chest radiograph. 2. Mild cardiomegaly. 3. Mild right basilar atelectasis. Electronically Signed   By: Miachel Roux M.D.   On: 10/15/2021 10:02   ECHOCARDIOGRAM COMPLETE  Result Date: 10/14/2021    ECHOCARDIOGRAM REPORT   Patient Name:   Dustin Wells Date of Exam: 10/14/2021 Medical Rec #:  [id number]  Height:       72.0 in Accession #:    [id number] Weight:       265.0 lb Date of Birth:  April 29, 1965  BSA:           2.401 m Patient Age:    89 years   BP:           131/92 mmHg Patient Gender: M          HR:           89 bpm. Exam Location:  [facility] Procedure: 2D Echo, Cardiac Doppler and Color Doppler Indications:     Syncope R55  History:         Patient has no prior history of Echocardiogram examinations.                  CHF, Arrythmias:Atrial Fibrillation; Risk Factors:Hypertension.  Sonographer:     Sherrie Sport Referring Phys:  [id number] AMY N COX Diagnosing Phys: Ida Rogue MD  Sonographer Comments: Technically difficult study due to poor echo windows, suboptimal parasternal window and suboptimal apical window. IMPRESSIONS  1. Left ventricular ejection fraction, by estimation, is 55 to 60%. The left ventricle has normal function. The left ventricle has no regional wall motion abnormalities. There is mild left ventricular hypertrophy. Left ventricular diastolic parameters were normal.  2. Right ventricular systolic function is normal. The right ventricular size is normal.  3. Left atrial size was mildly dilated.  4. The mitral valve was not well visualized. No evidence of mitral valve regurgitation. No evidence of mitral stenosis.  5. The aortic valve was not well visualized. Aortic valve regurgitation is not visualized. No aortic stenosis is present.  6. The inferior vena cava is normal in size with greater than 50% respiratory variability, suggesting right atrial pressure of 3 mmHg. FINDINGS  Left Ventricle: Left ventricular ejection fraction, by estimation, is 55 to 60%. The left ventricle has normal function. The left ventricle has no regional wall motion abnormalities. The left ventricular internal cavity size was normal in size. There is  mild left ventricular hypertrophy. Left ventricular diastolic parameters were normal. Right Ventricle: The right ventricular size is normal. No increase in right ventricular wall thickness. Right ventricular systolic function is normal. Left Atrium: Left atrial size was mildly  dilated. Right Atrium: Right atrial size was normal in size. Pericardium: There is no evidence of pericardial effusion. Mitral Valve: The mitral valve was not well visualized. No evidence of mitral valve regurgitation. No evidence of mitral valve stenosis. Tricuspid Valve: The tricuspid valve is not well visualized. Tricuspid valve regurgitation is trivial. No evidence of tricuspid stenosis. Aortic Valve: The aortic valve was not well visualized. Aortic valve regurgitation is not visualized. No aortic stenosis is present. Aortic valve mean gradient measures 6.0 mmHg. Aortic valve peak gradient measures 9.5 mmHg. Aortic valve area, by VTI measures 2.23 cm. Pulmonic Valve: The pulmonic valve was not well visualized. Pulmonic valve regurgitation is not visualized. No evidence  of pulmonic stenosis. Aorta: The aortic root is normal in size and structure. Venous: The inferior vena cava is normal in size with greater than 50% respiratory variability, suggesting right atrial pressure of 3 mmHg. IAS/Shunts: No atrial level shunt detected by color flow Doppler.  LEFT VENTRICLE PLAX 2D LVIDd:         4.50 cm   Diastology LVIDs:         3.30 cm   LV e' medial:   6.64 cm/s LV PW:         1.70 cm   LV E/e' medial: 10.8 LV IVS:        1.50 cm LVOT diam:     2.00 cm LV SV:         51 LV SV Index:   21 LVOT Area:     3.14 cm  RIGHT VENTRICLE RV Basal diam:  4.30 cm RV S prime:     14.60 cm/s TAPSE (M-mode): 2.0 cm LEFT ATRIUM             Index        RIGHT ATRIUM           Index LA diam:        4.00 cm 1.67 cm/m   RA Area:     16.90 cm LA Vol (A2C):   81.4 ml 33.90 ml/m  RA Volume:   43.80 ml  18.24 ml/m LA Vol (A4C):   56.8 ml 23.65 ml/m LA Biplane Vol: 71.8 ml 29.90 ml/m  AORTIC VALVE AV Area (Vmax):    2.20 cm AV Area (Vmean):   2.19 cm AV Area (VTI):     2.23 cm AV Vmax:           154.00 cm/s AV Vmean:          111.000 cm/s AV VTI:            0.227 m AV Peak Grad:      9.5 mmHg AV Mean Grad:      6.0 mmHg LVOT Vmax:          108.00 cm/s LVOT Vmean:        77.500 cm/s LVOT VTI:          0.161 m LVOT/AV VTI ratio: 0.71  AORTA Ao Root diam: 3.05 cm MITRAL VALVE               TRICUSPID VALVE MV Area (PHT): 4.15 cm    TR Peak grad:   7.0 mmHg MV Decel Time: 183 msec    TR Vmax:        132.00 cm/s MV E velocity: 72.00 cm/s MV A velocity: 50.60 cm/s  SHUNTS MV E/A ratio:  1.42        Systemic VTI:  0.16 m                            Systemic Diam: 2.00 cm Ida Rogue MD Electronically signed by Ida Rogue MD Signature Date/Time: 10/14/2021/1:53:41 PM    Final    MR BRAIN WO CONTRAST  Result Date: 10/14/2021 CLINICAL DATA:  56 year old male with altered mental status. Renal insufficiency. Administered MRI contrast yesterday for chest MRA. EXAM: MRI HEAD WITHOUT CONTRAST TECHNIQUE: Multiplanar, multiecho pulse sequences of the brain and surrounding structures were obtained without intravenous contrast. COMPARISON:  Head and cervical spine CT yesterday. Brain MRI 01/31/2020. FINDINGS: Brain: Chronic encephalomalacia in the right hemisphere, posterior right MCA territory.  Chronic ex vacuo enlargement of the atrium of the right lateral ventricle. FLAIR hyperintensity in the cystic areas of encephalomalacia and adjacent sulci probably is artifact related to the recent gadolinium administration. And there is similar mild increased T1 signal there also. But stable appearance of mild underlying hemosiderin on SWI. No restricted diffusion to suggest acute infarction. No midline shift, mass effect, evidence of mass lesion, ventriculomegaly, extra-axial collection or acute intracranial hemorrhage. Cervicomedullary junction and pituitary are within normal limits. Stable gray and white matter signal elsewhere since 2021, scattered nonspecific bilateral white matter T2 and FLAIR hyperintense foci. No other cortical encephalomalacia or chronic cerebral blood products identified. Deep gray matter nuclei, brainstem and cerebellum remain within  normal limits. Vascular: Major intracranial vascular flow voids are stable since 2021, dominant appearing distal right vertebral artery. Skull and upper cervical spine: Negative visible cervical spine. Visualized bone marrow signal is within normal limits. Sinuses/Orbits: Stable and negative orbits. Minimal paranasal sinus mucosal thickening or retention cysts have regressed since 2021. Other: Trace mastoid fluid has not significantly changed. Grossly normal visible internal auditory structures otherwise. Negative visible scalp and face. IMPRESSION: 1. No acute intracranial abnormality. 2. Chronic Right MCA territory encephalomalacia. Other mild for age nonspecific white matter changes. Electronically Signed   By: Genevie Ann M.D.   On: 10/14/2021 10:52   US RENAL  Result Date: 10/14/2021 CLINICAL DATA:  Acute kidney injury. EXAM: RENAL / URINARY TRACT ULTRASOUND COMPLETE COMPARISON:  Abdominal ultrasound 07/07/2021 FINDINGS: Right Kidney: Renal measurements: 11.2 x 5.8 x 5.4 cm = volume: 185 mL. Echogenicity within normal limits. Right renal midpole 1.2 x 0.8 x 0.5 cm simple cyst appears not significantly changed from prior when measured in a similar manner. Additional mid to lower pole 1.1 x 0.8 x 0.6 cm simple cyst. Left Kidney: Renal measurements: 11.6 x 5.5 x 5.7 cm = volume: 189 mL. Echogenicity within normal limits. Left midpole 1.1 x 1.1 x 0.7 cm benign simple cyst. Bladder: Appears normal for degree of bladder distention. Other: None. IMPRESSION: 1. No hydronephrosis within either kidney. 2. Benign bilateral renal simple cysts measuring up to 1.2 cm on the right. Electronically Signed   By: Yvonne Kendall M.D.   On: 10/14/2021 10:02   MR ANGIO CHEST W WO CONTRAST  Result Date: 10/13/2021 CLINICAL DATA:  Syncope, chest pain.  Evaluate for dissection EXAM: MRA CHEST WITH OR WITHOUT CONTRAST TECHNIQUE: Angiographic images of the chest were obtained using MRA technique without and with intravenous contrast.  CONTRAST:  79m GADAVIST GADOBUTROL 1 MMOL/ML IV SOLN COMPARISON:  None Available. FINDINGS: VASCULAR Aorta: Conventional 3 vessel arch anatomy. No evidence dissection aneurysm. Heart: Normal in size.  No pericardial effusion. Pulmonary Arteries: Normal in size. No signal abnormality to suggest pulmonary embolism. Other: None. NON-VASCULAR No focal signal abnormality or abnormal enhancement within the lungs, mediastinum, visualized upper abdomen or musculoskeletal soft tissues. IMPRESSION: 1. No evidence of aortic dissection, aneurysm or other acute arterial abnormality. 2. No acute cardiopulmonary process. Electronically Signed   By: HJacqulynn CadetM.D.   On: 10/13/2021 12:57   CT HEAD WO CONTRAST (5MM)  Result Date: 10/13/2021 CLINICAL DATA:  Woke up with weakness and dizziness and fell. EXAM: CT HEAD WITHOUT CONTRAST TECHNIQUE: Contiguous axial images were obtained from the base of the skull through the vertex without intravenous contrast. RADIATION DOSE REDUCTION: This exam was performed according to the departmental dose-optimization program which includes automated exposure control, adjustment of the mA and/or kV according to patient size and/or  use of iterative reconstruction technique. 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 No acute intracranial findings such as hemispheric infarction or intracranial hemorrhage. Stable patchy periventricular white matter disease. No extra-axial fluid collections are identified. No mass lesions. The brainstem and cerebellum are grossly normal. Vascular: Scattered vascular calcifications. No aneurysm or hyperdense vessels. Skull: No skull fracture or bone lesions. Sinuses/Orbits: The paranasal sinuses and mastoid air cells are clear. The globes are intact. Other: No scalp lesions or scalp hematoma.  IMPRESSION: 1. 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 2. No acute intracranial findings or mass lesions. Electronically Signed   By: Marijo Sanes M.D.   On: 10/13/2021 11:13   CT Cervical Spine Wo Contrast  Result Date: 10/13/2021 CLINICAL DATA:  Neck trauma. Dangers injury mechanism. Weakness and dizziness. Trauma to the back of the head. EXAM: CT CERVICAL SPINE WITHOUT CONTRAST TECHNIQUE: Multidetector CT imaging of the cervical spine was performed without intravenous contrast. Multiplanar CT image reconstructions were also generated. RADIATION DOSE REDUCTION: This exam was performed according to the departmental dose-optimization program which includes automated exposure control, adjustment of the mA and/or kV according to patient size and/or use of iterative reconstruction technique. COMPARISON:  None FINDINGS: Alignment: Normal Skull base and vertebrae: No fracture or focal bone lesion. Soft tissues and spinal canal: No traumatic soft tissue finding. Disc levels: Ordinary spondylosis at C6-7 with small endplate osteophytes but no bony stenosis of the canal or foramina. Facet osteoarthritis on the left at C7-T1. Upper chest: Negative Other: None IMPRESSION: No acute or traumatic finding.  Mild cervical degenerative changes. Electronically Signed   By: Nelson Chimes M.D.   On: 10/13/2021 11:03   DG Chest Portable 1 View  Result Date: 10/13/2021 CLINICAL DATA:  Syncopal episode. EXAM: PORTABLE CHEST 1 VIEW COMPARISON:  Radiographs 08/26/2017. FINDINGS: 1038 hours. The heart size appears stable at the upper limits of normal for portable AP technique. Probable mild atelectasis or scarring at both lung bases. No confluent airspace opacity, edema, pneumothorax or significant pleural effusion. No acute osseous findings are evident. Telemetry leads overlie the chest. IMPRESSION: No evidence of active cardiopulmonary process. Electronically Signed   By: Richardean Sale M.D.   On: 10/13/2021  10:47    Microbiology: Results for orders placed or performed during the hospital encounter of 10/20/21  CSF culture w Gram Stain     Status: None   Collection Time: 10/22/21  1:52 PM   Specimen: PATH Cytology CSF; Cerebrospinal Fluid  Result Value Ref Range Status   Specimen Description CSF  Final   Special Requests NONE  Final   Gram Stain   Final    WBC PRESENT, PREDOMINANTLY MONONUCLEAR NO ORGANISMS SEEN CYTOSPIN SMEAR    Culture   Final    NO GROWTH 3 DAYS Performed at [facility], [place] [address] [place], [place] [address]    Report Status 10/25/2021 FINAL  Final  Anaerobic culture w Gram Stain     Status: None (Preliminary result)   Collection Time: 10/22/21  1:52 PM   Specimen: PATH Cytology CSF; Cerebrospinal Fluid  Result Value Ref Range Status   Specimen Description CSF  Final   Special Requests   Final    NONE Performed at [facility], [place] [address], [place], [place] [address]    Culture   Final  NO ANAEROBES ISOLATED; CULTURE IN PROGRESS FOR 5 DAYS   Report Status PENDING  Incomplete    Labs: CBC: Recent Labs  Lab 10/19/21 1634 10/20/21 0437 10/21/21 0602 10/22/21 0616 10/23/21 0244 10/24/21 0453 10/25/21 0612  WBC 6.9   < > 7.7 6.5 5.8 5.6 5.3  NEUTROABS 4.3  --   --  4.3  --   --   --   HGB 13.0   < > 12.5* 13.5 12.8* 12.8* 12.3*  HCT 39.5   < > 37.2* 40.8 38.5* 39.2 36.6*  MCV 92.7   < > 93.9 96.0 94.6 95.6 95.3  PLT 229   < > 237 240 294 301 308   < > = values in this interval not displayed.   Basic Metabolic Panel: Recent Labs  Lab 10/19/21 0511 10/20/21 0437 10/21/21 0602 10/22/21 0616 10/23/21 0244 10/24/21 0453  NA 138 139 137 134* 135 134*  K 4.0 4.3 4.5 4.4 4.1 4.5  CL 105 108 108 103 105 106  CO2 26 21* _0 20*  GLUCOSE 92 106* 93 91 96 97  BUN 33* 30* _1 CREATININE 1.68* 1.59* 1.43* 1.48* 1.39* 1.36*  CALCIUM 9.2 9.6 9.5 9.6 9.4 9.3  MG 1.9 2.1  --   --   --   --    Liver Function  Tests: Recent Labs  Lab 10/20/21 0437 10/22/21 0616 10/23/21 0244  AST 55* 36 33  ALT 69* 56* 50*  ALKPHOS 68 74 84  BILITOT 1.4* 0.9 0.8  PROT 7.3 6.6 6.3*  ALBUMIN 3.3* 3.0* 3.0*   CBG: Recent Labs  Lab 10/19/21 1656 10/19/21 2016 10/20/21 0828 10/20/21 1126 10/20/21 1746  GLUCAP 108* 84 89 83 109*    Discharge time spent: greater than 30 minutes.  Signed: Patrecia Pour, MD [facility] 10/25/2021

## 2021-10-26 LAB — MISC LABCORP TEST (SEND OUT): Labcorp test code: 9985

## 2021-10-27 LAB — ANAEROBIC CULTURE W GRAM STAIN

## 2021-10-28 LAB — MISCELLANEOUS TEST

## 2021-10-29 LAB — MISCELLANEOUS TEST

## 2021-11-03 LAB — MISC LABCORP TEST (SEND OUT)
Labcorp test code: 138479
Labcorp test code: 138479

## 2021-11-06 LAB — CULTURE, FUNGUS WITHOUT SMEAR

## 2021-11-11 LAB — MISC LABCORP TEST (SEND OUT)
Labcorp test code: 9985
Labcorp test code: 9985

## 2021-11-24 LAB — FUNGUS CULTURE WITH STAIN

## 2021-11-24 LAB — FUNGUS CULTURE RESULT

## 2021-11-24 LAB — FUNGAL ORGANISM REFLEX

## 2021-12-31 ENCOUNTER — Other Ambulatory Visit: Payer: 59

## 2022-01-04 ENCOUNTER — Ambulatory Visit: Payer: 59 | Admitting: Urology

## 2022-08-22 ENCOUNTER — Other Ambulatory Visit: Payer: Self-pay

## 2022-08-22 ENCOUNTER — Inpatient Hospital Stay: Payer: BC Managed Care – PPO

## 2022-08-22 ENCOUNTER — Emergency Department: Payer: BC Managed Care – PPO

## 2022-08-22 ENCOUNTER — Inpatient Hospital Stay
Admission: EM | Admit: 2022-08-22 | Discharge: 2022-08-25 | DRG: 871 | Disposition: A | Discharge status: Home / Self Care | Payer: BC Managed Care – PPO | Attending: Student | Admitting: Student

## 2022-08-22 ENCOUNTER — Encounter: Payer: Self-pay | Admitting: Intensive Care

## 2022-08-22 DIAGNOSIS — N179 Acute kidney failure, unspecified: Secondary | ICD-10-CM | POA: Diagnosis present

## 2022-08-22 DIAGNOSIS — R569 Unspecified convulsions: Secondary | ICD-10-CM | POA: Diagnosis not present

## 2022-08-22 DIAGNOSIS — R9431 Abnormal electrocardiogram [ECG] [EKG]: Secondary | ICD-10-CM | POA: Diagnosis present

## 2022-08-22 DIAGNOSIS — F418 Other specified anxiety disorders: Secondary | ICD-10-CM

## 2022-08-22 DIAGNOSIS — F1020 Alcohol dependence, uncomplicated: Secondary | ICD-10-CM | POA: Diagnosis present

## 2022-08-22 DIAGNOSIS — A4151 Sepsis due to Escherichia coli [E. coli]: Principal | ICD-10-CM

## 2022-08-22 DIAGNOSIS — G9340 Encephalopathy, unspecified: Secondary | ICD-10-CM | POA: Diagnosis not present

## 2022-08-22 DIAGNOSIS — M109 Gout, unspecified: Secondary | ICD-10-CM | POA: Diagnosis present

## 2022-08-22 DIAGNOSIS — R319 Hematuria, unspecified: Secondary | ICD-10-CM | POA: Diagnosis not present

## 2022-08-22 DIAGNOSIS — I482 Chronic atrial fibrillation, unspecified: Secondary | ICD-10-CM

## 2022-08-22 DIAGNOSIS — G9341 Metabolic encephalopathy: Secondary | ICD-10-CM | POA: Diagnosis present

## 2022-08-22 DIAGNOSIS — Z1152 Encounter for screening for COVID-19: Secondary | ICD-10-CM

## 2022-08-22 DIAGNOSIS — Z8547 Personal history of malignant neoplasm of testis: Secondary | ICD-10-CM

## 2022-08-22 DIAGNOSIS — A419 Sepsis, unspecified organism: Principal | ICD-10-CM

## 2022-08-22 DIAGNOSIS — R6520 Severe sepsis without septic shock: Secondary | ICD-10-CM

## 2022-08-22 DIAGNOSIS — E785 Hyperlipidemia, unspecified: Secondary | ICD-10-CM | POA: Diagnosis present

## 2022-08-22 DIAGNOSIS — R7881 Bacteremia: Secondary | ICD-10-CM

## 2022-08-22 DIAGNOSIS — E669 Obesity, unspecified: Secondary | ICD-10-CM | POA: Diagnosis present

## 2022-08-22 DIAGNOSIS — I130 Hypertensive heart and chronic kidney disease with heart failure and stage 1 through stage 4 chronic kidney disease, or unspecified chronic kidney disease: Secondary | ICD-10-CM | POA: Diagnosis present

## 2022-08-22 DIAGNOSIS — Z79899 Other long term (current) drug therapy: Secondary | ICD-10-CM

## 2022-08-22 DIAGNOSIS — I4891 Unspecified atrial fibrillation: Secondary | ICD-10-CM

## 2022-08-22 DIAGNOSIS — B004 Herpesviral encephalitis: Secondary | ICD-10-CM

## 2022-08-22 DIAGNOSIS — F419 Anxiety disorder, unspecified: Secondary | ICD-10-CM | POA: Diagnosis present

## 2022-08-22 DIAGNOSIS — Z6837 Body mass index (BMI) 37.0-37.9, adult: Secondary | ICD-10-CM

## 2022-08-22 DIAGNOSIS — G0490 Encephalitis and encephalomyelitis, unspecified: Secondary | ICD-10-CM

## 2022-08-22 DIAGNOSIS — I447 Left bundle-branch block, unspecified: Secondary | ICD-10-CM | POA: Diagnosis present

## 2022-08-22 DIAGNOSIS — N1832 Chronic kidney disease, stage 3b: Secondary | ICD-10-CM

## 2022-08-22 DIAGNOSIS — Z888 Allergy status to other drugs, medicaments and biological substances status: Secondary | ICD-10-CM

## 2022-08-22 DIAGNOSIS — B9620 Unspecified Escherichia coli [E. coli] as the cause of diseases classified elsewhere: Secondary | ICD-10-CM

## 2022-08-22 DIAGNOSIS — Z8673 Personal history of transient ischemic attack (TIA), and cerebral infarction without residual deficits: Secondary | ICD-10-CM

## 2022-08-22 DIAGNOSIS — F32A Depression, unspecified: Secondary | ICD-10-CM | POA: Diagnosis present

## 2022-08-22 DIAGNOSIS — J9601 Acute respiratory failure with hypoxia: Secondary | ICD-10-CM | POA: Diagnosis present

## 2022-08-22 DIAGNOSIS — R197 Diarrhea, unspecified: Secondary | ICD-10-CM | POA: Diagnosis present

## 2022-08-22 DIAGNOSIS — I5032 Chronic diastolic (congestive) heart failure: Secondary | ICD-10-CM | POA: Diagnosis present

## 2022-08-22 DIAGNOSIS — Z8661 Personal history of infections of the central nervous system: Secondary | ICD-10-CM

## 2022-08-22 DIAGNOSIS — G40909 Epilepsy, unspecified, not intractable, without status epilepticus: Secondary | ICD-10-CM

## 2022-08-22 DIAGNOSIS — Z7901 Long term (current) use of anticoagulants: Secondary | ICD-10-CM | POA: Diagnosis not present

## 2022-08-22 DIAGNOSIS — D696 Thrombocytopenia, unspecified: Secondary | ICD-10-CM | POA: Diagnosis present

## 2022-08-22 DIAGNOSIS — R0902 Hypoxemia: Secondary | ICD-10-CM | POA: Diagnosis present

## 2022-08-22 DIAGNOSIS — I10 Essential (primary) hypertension: Secondary | ICD-10-CM | POA: Diagnosis not present

## 2022-08-22 DIAGNOSIS — Z881 Allergy status to other antibiotic agents status: Secondary | ICD-10-CM

## 2022-08-22 DIAGNOSIS — Z7985 Long-term (current) use of injectable non-insulin antidiabetic drugs: Secondary | ICD-10-CM

## 2022-08-22 DIAGNOSIS — D72829 Elevated white blood cell count, unspecified: Secondary | ICD-10-CM | POA: Diagnosis not present

## 2022-08-22 DIAGNOSIS — A040 Enteropathogenic Escherichia coli infection: Secondary | ICD-10-CM | POA: Diagnosis present

## 2022-08-22 DIAGNOSIS — R4182 Altered mental status, unspecified: Secondary | ICD-10-CM

## 2022-08-22 DIAGNOSIS — K760 Fatty (change of) liver, not elsewhere classified: Secondary | ICD-10-CM | POA: Diagnosis present

## 2022-08-22 DIAGNOSIS — N189 Chronic kidney disease, unspecified: Secondary | ICD-10-CM | POA: Diagnosis not present

## 2022-08-22 DIAGNOSIS — Z8249 Family history of ischemic heart disease and other diseases of the circulatory system: Secondary | ICD-10-CM

## 2022-08-22 DIAGNOSIS — G039 Meningitis, unspecified: Secondary | ICD-10-CM | POA: Diagnosis present

## 2022-08-22 DIAGNOSIS — R001 Bradycardia, unspecified: Secondary | ICD-10-CM | POA: Diagnosis not present

## 2022-08-22 LAB — URINALYSIS, ROUTINE W REFLEX MICROSCOPIC
Bacteria, UA: NONE SEEN
Bilirubin Urine: NEGATIVE
Glucose, UA: NEGATIVE mg/dL
Ketones, ur: NEGATIVE mg/dL
Leukocytes,Ua: NEGATIVE
Nitrite: NEGATIVE
Protein, ur: 30 mg/dL — AB
Specific Gravity, Urine: 1.011 (ref 1.005–1.030)
WBC, UA: NONE SEEN WBC/hpf (ref 0–5)
pH: 6 (ref 5.0–8.0)

## 2022-08-22 LAB — MENINGITIS/ENCEPHALITIS PANEL (CSF)
Cryptococcus neoformans/gattii (CSF): NOT DETECTED
Cytomegalovirus (CSF): NOT DETECTED
Enterovirus (CSF): NOT DETECTED
Escherichia coli K1 (CSF): NOT DETECTED
Haemophilus influenzae (CSF): NOT DETECTED
Herpes simplex virus 1 (CSF): NOT DETECTED
Herpes simplex virus 2 (CSF): NOT DETECTED
Human herpesvirus 6 (CSF): DETECTED — AB
Human parechovirus (CSF): NOT DETECTED
Listeria monocytogenes (CSF): NOT DETECTED
Neisseria meningitis (CSF): NOT DETECTED
Streptococcus agalactiae (CSF): NOT DETECTED
Streptococcus pneumoniae (CSF): NOT DETECTED
Varicella zoster virus (CSF): NOT DETECTED

## 2022-08-22 LAB — CBC WITH DIFFERENTIAL/PLATELET
Abs Immature Granulocytes: 0.03 10*3/uL (ref 0.00–0.07)
Basophils Absolute: 0 10*3/uL (ref 0.0–0.1)
Basophils Relative: 1 %
Eosinophils Absolute: 0.1 10*3/uL (ref 0.0–0.5)
Eosinophils Relative: 2 %
HCT: 48.5 % (ref 39.0–52.0)
Hemoglobin: 16.1 g/dL (ref 13.0–17.0)
Immature Granulocytes: 1 %
Lymphocytes Relative: 10 %
Lymphs Abs: 0.3 10*3/uL — ABNORMAL LOW (ref 0.7–4.0)
MCH: 32.5 pg (ref 26.0–34.0)
MCHC: 33.2 g/dL (ref 30.0–36.0)
MCV: 98 fL (ref 80.0–100.0)
Monocytes Absolute: 0 10*3/uL — ABNORMAL LOW (ref 0.1–1.0)
Monocytes Relative: 1 %
Neutro Abs: 2.3 10*3/uL (ref 1.7–7.7)
Neutrophils Relative %: 85 %
Platelets: 128 10*3/uL — ABNORMAL LOW (ref 150–400)
RBC: 4.95 MIL/uL (ref 4.22–5.81)
RDW: 13 % (ref 11.5–15.5)
WBC: 2.7 10*3/uL — ABNORMAL LOW (ref 4.0–10.5)
nRBC: 0 % (ref 0.0–0.2)

## 2022-08-22 LAB — CSF CULTURE W GRAM STAIN

## 2022-08-22 LAB — BLOOD CULTURE ID PANEL (REFLEXED) - BCID2

## 2022-08-22 LAB — LACTIC ACID, PLASMA
Lactic Acid, Venous: 3 mmol/L (ref 0.5–1.9)
Lactic Acid, Venous: 2.5 mmol/L (ref 0.5–1.9)
Lactic Acid, Venous: 3.9 mmol/L (ref 0.5–1.9)

## 2022-08-22 LAB — LIPASE, BLOOD: Lipase: 33 U/L (ref 11–51)

## 2022-08-22 LAB — COMPREHENSIVE METABOLIC PANEL
ALT: 38 U/L (ref 0–44)
AST: 42 U/L — ABNORMAL HIGH (ref 15–41)
Albumin: 4.3 g/dL (ref 3.5–5.0)
Alkaline Phosphatase: 127 U/L — ABNORMAL HIGH (ref 38–126)
Anion gap: 11 (ref 5–15)
BUN: 23 mg/dL — ABNORMAL HIGH (ref 6–20)
CO2: 24 mmol/L (ref 22–32)
Calcium: 9.4 mg/dL (ref 8.9–10.3)
Chloride: 104 mmol/L (ref 98–111)
Creatinine, Ser: 1.65 mg/dL — ABNORMAL HIGH (ref 0.61–1.24)
GFR, Estimated: 49 mL/min — ABNORMAL LOW (ref >60)
Glucose, Bld: 104 mg/dL — ABNORMAL HIGH (ref 70–99)
Potassium: 4.2 mmol/L (ref 3.5–5.1)
Sodium: 139 mmol/L (ref 135–145)
Total Bilirubin: 1.5 mg/dL — ABNORMAL HIGH (ref 0.3–1.2)
Total Protein: 7.8 g/dL (ref 6.5–8.1)

## 2022-08-22 LAB — AMMONIA: Ammonia: 24 umol/L (ref 9–35)

## 2022-08-22 LAB — PROTEIN AND GLUCOSE, CSF
Glucose, CSF: 67 mg/dL (ref 40–70)
Total  Protein, CSF: 43 mg/dL (ref 15–45)

## 2022-08-22 LAB — CULTURE, BLOOD (ROUTINE X 2): Special Requests: ADEQUATE

## 2022-08-22 LAB — CSF CELL COUNT WITH DIFFERENTIAL
Lymphs, CSF: 21 %
Monocyte-Macrophage-Spinal Fluid: 79 %
RBC Count, CSF: 61 /mm3 — ABNORMAL HIGH (ref 0–3)
Segmented Neutrophils-CSF: 0 %
Tube #: 3
WBC, CSF: 6 /mm3 — ABNORMAL HIGH (ref 0–5)

## 2022-08-22 LAB — TROPONIN I (HIGH SENSITIVITY): Troponin I (High Sensitivity): 8 ng/L (ref <18)

## 2022-08-22 LAB — PROTIME-INR
INR: 1.3 — ABNORMAL HIGH (ref 0.8–1.2)
Prothrombin Time: 16.8 seconds — ABNORMAL HIGH (ref 11.4–15.2)

## 2022-08-22 LAB — MAGNESIUM: Magnesium: 1.6 mg/dL — ABNORMAL LOW (ref 1.7–2.4)

## 2022-08-22 LAB — RESP PANEL BY RT-PCR (RSV, FLU A&B, COVID)  RVPGX2
Influenza A by PCR: NEGATIVE
Influenza B by PCR: NEGATIVE
Resp Syncytial Virus by PCR: NEGATIVE
SARS Coronavirus 2 by RT PCR: NEGATIVE

## 2022-08-22 LAB — CBG MONITORING, ED: Glucose-Capillary: 184 mg/dL — ABNORMAL HIGH (ref 70–99)

## 2022-08-22 LAB — APTT: aPTT: 26 seconds (ref 24–36)

## 2022-08-22 LAB — PROCALCITONIN: Procalcitonin: 0.18 ng/mL

## 2022-08-22 LAB — BRAIN NATRIURETIC PEPTIDE: B Natriuretic Peptide: 45.4 pg/mL (ref 0.0–100.0)

## 2022-08-22 LAB — PHOSPHORUS: Phosphorus: 2.2 mg/dL — ABNORMAL LOW (ref 2.5–4.6)

## 2022-08-22 MED ORDER — LORAZEPAM 2 MG/ML IJ SOLN
2.0000 mg | INTRAMUSCULAR | Status: DC | PRN
  Administered 2022-08-22: 2 mg via INTRAVENOUS

## 2022-08-22 MED ORDER — MAGNESIUM OXIDE -MG SUPPLEMENT 400 (240 MG) MG PO TABS
400.0000 mg | ORAL_TABLET | Freq: Two times a day (BID) | ORAL | Status: DC
  Administered 2022-08-23 – 2022-08-25 (×5): 400 mg via ORAL
  Filled 2022-08-22 (×5): qty 1

## 2022-08-22 MED ORDER — LEVETIRACETAM 500 MG PO TABS
1500.0000 mg | ORAL_TABLET | Freq: Two times a day (BID) | ORAL | Status: DC
  Administered 2022-08-23 – 2022-08-25 (×5): 1500 mg via ORAL
  Filled 2022-08-22: qty 3
  Filled 2022-08-22: qty 2
  Filled 2022-08-22 (×4): qty 3
  Filled 2022-08-22: qty 2

## 2022-08-22 MED ORDER — IOHEXOL 350 MG/ML SOLN
100.0000 mL | Freq: Once | INTRAVENOUS | Status: AC | PRN
  Administered 2022-08-22: 100 mL via INTRAVENOUS

## 2022-08-22 MED ORDER — SODIUM CHLORIDE 0.9 % IV SOLN
2.0000 g | Freq: Two times a day (BID) | INTRAVENOUS | Status: DC
  Administered 2022-08-22: 2 g via INTRAVENOUS
  Filled 2022-08-22: qty 20

## 2022-08-22 MED ORDER — DIPHENHYDRAMINE HCL 50 MG/ML IJ SOLN: 25.0000 mg | Freq: Three times a day (TID) | INTRAMUSCULAR | Status: DC | PRN

## 2022-08-22 MED ORDER — SODIUM BICARBONATE 650 MG PO TABS
650.0000 mg | ORAL_TABLET | Freq: Two times a day (BID) | ORAL | Status: DC
  Administered 2022-08-23 – 2022-08-25 (×5): 650 mg via ORAL
  Filled 2022-08-22 (×7): qty 1

## 2022-08-22 MED ORDER — HYDRALAZINE HCL 20 MG/ML IJ SOLN: 5.0000 mg | INTRAMUSCULAR | Status: DC | PRN

## 2022-08-22 MED ORDER — LORAZEPAM 2 MG/ML IJ SOLN: 1.0000 mg | Freq: Once | INTRAMUSCULAR | Status: DC

## 2022-08-22 MED ORDER — SODIUM CHLORIDE 0.9 % IV SOLN: INTRAVENOUS | Status: DC

## 2022-08-22 MED ORDER — METRONIDAZOLE 500 MG/100ML IV SOLN
500.0000 mg | Freq: Once | INTRAVENOUS | Status: AC
  Administered 2022-08-22: 500 mg via INTRAVENOUS
  Filled 2022-08-22: qty 100

## 2022-08-22 MED ORDER — ALBUTEROL SULFATE (2.5 MG/3ML) 0.083% IN NEBU: 2.5000 mg | INHALATION_SOLUTION | RESPIRATORY_TRACT | Status: DC | PRN

## 2022-08-22 MED ORDER — DM-GUAIFENESIN ER 30-600 MG PO TB12: 1.0000 | ORAL_TABLET | Freq: Two times a day (BID) | ORAL | Status: DC | PRN

## 2022-08-22 MED ORDER — SODIUM CHLORIDE 0.9 % IV BOLUS: 500.0000 mL | Freq: Once | INTRAVENOUS | Status: DC

## 2022-08-22 MED ORDER — SODIUM CHLORIDE 0.9 % IV SOLN
2.0000 g | Freq: Once | INTRAVENOUS | Status: AC
  Administered 2022-08-22: 2 g via INTRAVENOUS
  Filled 2022-08-22: qty 12.5

## 2022-08-22 MED ORDER — SODIUM CHLORIDE 0.9 % IV BOLUS
1000.0000 mL | Freq: Once | INTRAVENOUS | Status: AC
  Administered 2022-08-22: 1000 mL via INTRAVENOUS

## 2022-08-22 MED ORDER — LORAZEPAM 2 MG/ML IJ SOLN: 0.0000 mg | Freq: Two times a day (BID) | INTRAMUSCULAR | Status: DC

## 2022-08-22 MED ORDER — LORAZEPAM 1 MG PO TABS: 1.0000 mg | ORAL_TABLET | ORAL | Status: AC | PRN

## 2022-08-22 MED ORDER — VANCOMYCIN HCL 750 MG/150ML IV SOLN
750.0000 mg | Freq: Two times a day (BID) | INTRAVENOUS | Status: DC
  Filled 2022-08-22: qty 150

## 2022-08-22 MED ORDER — SODIUM CHLORIDE 0.9 % IV SOLN
3.0000 g | Freq: Four times a day (QID) | INTRAVENOUS | Status: DC
  Administered 2022-08-22 (×2): 3 g via INTRAVENOUS
  Filled 2022-08-22 (×2): qty 8

## 2022-08-22 MED ORDER — SODIUM CHLORIDE 0.9 % IV SOLN: 2.0000 g | Freq: Three times a day (TID) | INTRAVENOUS | Status: DC

## 2022-08-22 MED ORDER — MAGNESIUM SULFATE 2 GM/50ML IV SOLN
2.0000 g | Freq: Once | INTRAVENOUS | Status: AC
  Administered 2022-08-22: 2 g via INTRAVENOUS
  Filled 2022-08-22: qty 50

## 2022-08-22 MED ORDER — VANCOMYCIN HCL IN DEXTROSE 1-5 GM/200ML-% IV SOLN
1000.0000 mg | Freq: Once | INTRAVENOUS | Status: AC
  Administered 2022-08-22: 1000 mg via INTRAVENOUS
  Filled 2022-08-22: qty 200

## 2022-08-22 MED ORDER — GABAPENTIN 300 MG PO CAPS
700.0000 mg | ORAL_CAPSULE | Freq: Every day | ORAL | Status: DC
  Administered 2022-08-23 – 2022-08-24 (×2): 700 mg via ORAL
  Filled 2022-08-22 (×2): qty 1
  Filled 2022-08-22 (×2): qty 2
  Filled 2022-08-22: qty 1

## 2022-08-22 MED ORDER — THIAMINE MONONITRATE 100 MG PO TABS
100.0000 mg | ORAL_TABLET | Freq: Every day | ORAL | Status: DC
  Administered 2022-08-24 – 2022-08-25 (×2): 100 mg via ORAL
  Filled 2022-08-22 (×2): qty 1

## 2022-08-22 MED ORDER — VITAMIN D (ERGOCALCIFEROL) 1.25 MG (50000 UNIT) PO CAPS
50000.0000 [IU] | ORAL_CAPSULE | Freq: Every day | ORAL | Status: DC
  Administered 2022-08-23 – 2022-08-25 (×3): 50000 [IU] via ORAL
  Filled 2022-08-22 (×4): qty 1

## 2022-08-22 MED ORDER — PANTOPRAZOLE SODIUM 40 MG PO TBEC: 40.0000 mg | DELAYED_RELEASE_TABLET | Freq: Every day | ORAL | Status: DC | PRN

## 2022-08-22 MED ORDER — APIXABAN 5 MG PO TABS
5.0000 mg | ORAL_TABLET | Freq: Two times a day (BID) | ORAL | Status: DC
  Administered 2022-08-23 – 2022-08-25 (×5): 5 mg via ORAL
  Filled 2022-08-22 (×5): qty 1

## 2022-08-22 MED ORDER — ACETAMINOPHEN 325 MG PO TABS
650.0000 mg | ORAL_TABLET | Freq: Four times a day (QID) | ORAL | Status: DC | PRN
  Administered 2022-08-23 – 2022-08-25 (×2): 650 mg via ORAL
  Filled 2022-08-22 (×2): qty 2

## 2022-08-22 MED ORDER — ALLOPURINOL 100 MG PO TABS
100.0000 mg | ORAL_TABLET | Freq: Every day | ORAL | Status: DC
  Administered 2022-08-23 – 2022-08-25 (×3): 100 mg via ORAL
  Filled 2022-08-22 (×4): qty 1

## 2022-08-22 MED ORDER — THIAMINE HCL 100 MG/ML IJ SOLN
100.0000 mg | Freq: Every day | INTRAMUSCULAR | Status: DC
  Administered 2022-08-22 – 2022-08-23 (×2): 100 mg via INTRAVENOUS
  Filled 2022-08-22 (×3): qty 2

## 2022-08-22 MED ORDER — SODIUM CHLORIDE 0.9 % IV SOLN
2.0000 g | INTRAVENOUS | Status: DC
  Administered 2022-08-23 – 2022-08-24 (×2): 2 g via INTRAVENOUS
  Filled 2022-08-22 (×4): qty 20

## 2022-08-22 MED ORDER — FOLIC ACID 1 MG PO TABS
1.0000 mg | ORAL_TABLET | Freq: Every day | ORAL | Status: DC
  Administered 2022-08-22 – 2022-08-25 (×4): 1 mg via ORAL
  Filled 2022-08-22 (×4): qty 1

## 2022-08-22 MED ORDER — METOPROLOL SUCCINATE ER 50 MG PO TB24
100.0000 mg | ORAL_TABLET | Freq: Every day | ORAL | Status: DC
  Administered 2022-08-23 – 2022-08-25 (×3): 100 mg via ORAL
  Filled 2022-08-22 (×3): qty 2

## 2022-08-22 MED ORDER — LACOSAMIDE 50 MG PO TABS
100.0000 mg | ORAL_TABLET | Freq: Two times a day (BID) | ORAL | Status: DC
  Administered 2022-08-23 – 2022-08-25 (×5): 100 mg via ORAL
  Filled 2022-08-22 (×6): qty 2

## 2022-08-22 MED ORDER — VANCOMYCIN HCL 1500 MG/300ML IV SOLN
1500.0000 mg | Freq: Once | INTRAVENOUS | Status: AC
  Administered 2022-08-22: 1500 mg via INTRAVENOUS
  Filled 2022-08-22 (×2): qty 300

## 2022-08-22 MED ORDER — LORAZEPAM 2 MG/ML IJ SOLN
0.0000 mg | Freq: Four times a day (QID) | INTRAMUSCULAR | Status: AC
  Administered 2022-08-22 – 2022-08-23 (×2): 2 mg via INTRAVENOUS
  Filled 2022-08-22 (×3): qty 1

## 2022-08-22 MED ORDER — VITAMIN B-12 1000 MCG PO TABS
1000.0000 ug | ORAL_TABLET | Freq: Every day | ORAL | Status: DC
  Administered 2022-08-23 – 2022-08-25 (×3): 1000 ug via ORAL
  Filled 2022-08-22 (×3): qty 1

## 2022-08-22 MED ORDER — DEXAMETHASONE SODIUM PHOSPHATE 10 MG/ML IJ SOLN
10.0000 mg | Freq: Once | INTRAMUSCULAR | Status: AC
  Administered 2022-08-22: 10 mg via INTRAVENOUS
  Filled 2022-08-22: qty 1

## 2022-08-22 MED ORDER — LORAZEPAM 2 MG PO TABS
2.0000 mg | ORAL_TABLET | Freq: Every evening | ORAL | Status: DC | PRN
  Administered 2022-08-23 – 2022-08-24 (×2): 2 mg via ORAL
  Filled 2022-08-22 (×2): qty 1

## 2022-08-22 MED ORDER — ADULT MULTIVITAMIN W/MINERALS CH
1.0000 | ORAL_TABLET | Freq: Every day | ORAL | Status: DC
  Administered 2022-08-22 – 2022-08-25 (×4): 1 via ORAL
  Filled 2022-08-22 (×4): qty 1

## 2022-08-22 MED ORDER — ATORVASTATIN CALCIUM 20 MG PO TABS
40.0000 mg | ORAL_TABLET | Freq: Every day | ORAL | Status: DC
  Administered 2022-08-23 – 2022-08-25 (×3): 40 mg via ORAL
  Filled 2022-08-22 (×3): qty 2

## 2022-08-22 MED ORDER — LORAZEPAM 2 MG/ML IJ SOLN
2.0000 mg | Freq: Once | INTRAMUSCULAR | Status: AC
  Administered 2022-08-22: 2 mg via INTRAVENOUS
  Filled 2022-08-22: qty 1

## 2022-08-22 MED ORDER — ONDANSETRON HCL 4 MG/2ML IJ SOLN: 4.0000 mg | Freq: Once | INTRAMUSCULAR | Status: DC

## 2022-08-22 MED ORDER — DEXTROSE 5 % IV SOLN
10.0000 mg/kg | Freq: Three times a day (TID) | INTRAVENOUS | Status: DC
  Administered 2022-08-22: 1270 mg via INTRAVENOUS
  Filled 2022-08-22: qty 25.4
  Filled 2022-08-22: qty 20

## 2022-08-22 MED ORDER — LORAZEPAM 2 MG/ML IJ SOLN
1.0000 mg | INTRAMUSCULAR | Status: AC | PRN
  Filled 2022-08-22: qty 1

## 2022-08-22 NOTE — Progress Notes (Signed)
PHARMACY - PHYSICIAN COMMUNICATION CRITICAL VALUE ALERT - BLOOD CULTURE IDENTIFICATION (BCID)  Dustin Wells is an 57 y.o. male who presented to [facility] on 08/22/2022 with a chief complaint of encephalitis   Assessment:  E coli in 1 of 4 bottles (anaerobic), no resistance,  currently empirically treated for meningitis (include suspected source if known)  Name of physician (or Provider) Contacted: [place]  Current antibiotics: Vanc, Unasyn , Acyclovir , Ceftriaxone 2 gm IV Q12H   Changes to prescribed antibiotics recommended:  D/C Unasyn,  continue other abx   No results found for this or any previous visit.  Van Seymore,Cristobal D 08/22/2022  10:23 PM

## 2022-08-22 NOTE — Consult Note (Incomplete)
NAME: Dustin Wells  DOB: 22-Aug-1965  MRN: [id number]  Date/Time: 08/22/2022 5:04 PM  REQUESTING PROVIDER: [email or web address] Subjective:  REASON FOR CONSULT:  r/o menigitis   Dustin Wells is a 57 y.o. male with a history of seizure disorder, HHv6 encephalitis in 2023, testicular cancers/p surgery and radiation, CVA, HTN, CKD presents with abdominal pain , shakes and not feeling well Pt says he was feeling okay until yesterday- HE got up this morning and ate brisket and then went to the bathroom and started having lower abdominal pain and was also shaking with chills. Had some back pain and felt weak. His wife called EMS and was brought to the ED. He says he has been drinking vodka 3-4 shots /day and last drink was yesterday night- over the weekend he was working in his yard Vitals in the ED  08/22/22 10:27  BP 111/74  Pulse Rate 95  Resp 18  SpO2 94 %  Temp 99.3  Labs  Latest Reference Range & Units 08/22/22 10:27  WBC 4.0 - 10.5 K/uL 2.7 (L)  Hemoglobin 13.0 - 17.0 g/dL 78.2  HCT 95.6 - 21.3 % 48.5  Platelets 150 - 400 K/uL 128 (L)  Creatinine 0.61 - 1.24 mg/dL 0.86 (H)    Past Medical History:  Diagnosis Date  . Atrial fibrillation (HCC)   . CHF (congestive heart failure) (HCC)   . Hypertension   . Renal disorder   . Stroke (HCC)   . Testicular cancer [facility])     Past Surgical History:  Procedure Laterality Date  . cardiac albation    . CARDIAC ELECTROPHYSIOLOGY MAPPING AND ABLATION      Social History   Socioeconomic History  . Marital status: Married    Spouse name: Not on file  . Number of children: Not on file  . Years of education: Not on file  . Highest education level: Not on file  Occupational History  . Not on file  Tobacco Use  . Smoking status: Never  . Smokeless tobacco: Never  Vaping Use  . Vaping Use: Never used  Substance and Sexual Activity  . Alcohol use: Yes    Alcohol/week: 21.0 standard drinks of alcohol    Types: 21 Standard drinks or equivalent per week   . Drug use: Not Currently  . Sexual activity: Not on file  Other Topics Concern  . Not on file  Social History Narrative  . Not on file   Social Determinants of Health   Financial Resource Strain: Not on file  Food Insecurity: Not on file  Transportation Needs: Not on file  Physical Activity: Not on file  Stress: Not on file  Social Connections: Not on file  Intimate Partner Violence: Not on file    Family History  Problem Relation Age of Onset  . Hypertension Mother   . Heart disease Father    Allergies  Allergen Reactions  . Rivaroxaban Diarrhea, Nausea Only and Other (See Comments)    Nausea, diarrhea, abdominal pain Nausea, diarrhea, abdominal pain Nausea, diarrhea, abdominal pain   . Sulfamethoxazole-Trimethoprim Rash   I  Current Facility-Administered Medications  Medication Dose Route Frequency Provider Last Rate Last Admin  . 0.9 %  sodium chloride infusion   Intravenous Continuous Lorretta Harp, MD      . acetaminophen (TYLENOL) tablet 650 mg  650 mg Oral Q6H PRN Lorretta Harp, MD      . acyclovir (ZOVIRAX) 1,270 mg in dextrose 5 % 250 mL IVPB  10 mg/kg Intravenous Q8H Nazari,  Walid A, RPH      . albuterol (PROVENTIL) (2.5 MG/3ML) 0.083% nebulizer solution 2.5 mg  2.5 mg Inhalation Q4H PRN Lorretta Harp, MD      . Melene Muller ON 08/23/2022] allopurinol (ZYLOPRIM) tablet 100 mg  100 mg Oral Daily Lorretta Harp, MD      . Ampicillin-Sulbactam (UNASYN) 3 g in sodium chloride 0.9 % 100 mL IVPB  3 g Intravenous Q6H Nazari, Walid A, RPH 200 mL/hr at 08/22/22 1658 3 g at 08/22/22 1658  . [START ON 08/23/2022] apixaban (ELIQUIS) tablet 5 mg  5 mg Oral BID Lorretta Harp, MD      . Melene Muller ON 08/23/2022] atorvastatin (LIPITOR) tablet 40 mg  40 mg Oral Daily Lorretta Harp, MD      . cefTRIAXone (ROCEPHIN) 2 g in sodium chloride 0.9 % 100 mL IVPB  2 g Intravenous Q12H Nazari, Walid A, RPH      . [START ON 08/23/2022] cyanocobalamin (VITAMIN B12) tablet 1,000 mcg  1,000 mcg Oral Daily Lorretta Harp, MD      .  dextromethorphan-guaiFENesin [facility] DM) 30-600 MG per 12 hr tablet 1 tablet  1 tablet Oral BID PRN Lorretta Harp, MD      . diphenhydrAMINE (BENADRYL) injection 25 mg  25 mg Intravenous Q8H PRN Lorretta Harp, MD      . folic acid (FOLVITE) tablet 1 mg  1 mg Oral Daily Lorretta Harp, MD   1 mg at 08/22/22 1652  . gabapentin (NEURONTIN) capsule 700 mg  700 mg Oral QHS Lorretta Harp, MD      . hydrALAZINE (APRESOLINE) injection 5 mg  5 mg Intravenous Q2H PRN Lorretta Harp, MD      . lacosamide (VIMPAT) tablet 100 mg  100 mg Oral BID Lorretta Harp, MD      . levETIRAcetam (KEPPRA) tablet 1,500 mg  1,500 mg Oral BID Lorretta Harp, MD      . LORazepam (ATIVAN) injection 0-4 mg  0-4 mg Intravenous Q6H Lorretta Harp, MD   2 mg at 08/22/22 1651   Followed by  . [START ON 08/24/2022] LORazepam (ATIVAN) injection 0-4 mg  0-4 mg Intravenous Q12H Lorretta Harp, MD      . LORazepam (ATIVAN) tablet 1-4 mg  1-4 mg Oral Q1H PRN Lorretta Harp, MD       Or  . LORazepam (ATIVAN) injection 1-4 mg  1-4 mg Intravenous Q1H PRN Lorretta Harp, MD      . LORazepam (ATIVAN) injection 2 mg  2 mg Intravenous Q2H PRN Lorretta Harp, MD      . LORazepam (ATIVAN) tablet 2 mg  2 mg Oral QHS PRN Lorretta Harp, MD      . Melene Muller ON 08/23/2022] magnesium oxide (MAG-OX) tablet 400 mg  400 mg Oral BID Lorretta Harp, MD      . Melene Muller ON 08/23/2022] metoprolol succinate (TOPROL-XL) 24 hr tablet 100 mg  100 mg Oral Daily Lorretta Harp, MD      . multivitamin with minerals tablet 1 tablet  1 tablet Oral Daily Lorretta Harp, MD   1 tablet at 08/22/22 1652  . pantoprazole (PROTONIX) EC tablet 40 mg  40 mg Oral Daily PRN Lorretta Harp, MD      . sodium bicarbonate tablet 650 mg  650 mg Oral BID Lorretta Harp, MD      . thiamine (VITAMIN B1) tablet 100 mg  100 mg Oral Daily Lorretta Harp, MD       Or  . thiamine (VITAMIN B1) injection 100 mg  100 mg Intravenous Daily Lorretta Harp, MD   100 mg at 08/22/22 1627  . vancomycin (VANCOREADY) IVPB 1500 mg/300 mL  1,500 mg Intravenous Once Orson Aloe,  RPH 150 mL/hr at 08/22/22 1626 1,500 mg at 08/22/22 1626  . [START ON 08/23/2022] Vitamin D (Ergocalciferol) (DRISDOL) 1.25 MG (50000 UNIT) capsule 50,000 Units  50,000 Units Oral Daily Lorretta Harp, MD       Current Outpatient Medications  Medication Sig Dispense Refill  . acetaminophen (TYLENOL) 325 MG tablet Take 650 mg by mouth every 6 (six) hours as needed for mild pain.    Marland Kitchen allopurinol (ZYLOPRIM) 300 MG tablet Take 100 mg by mouth daily.    Marland Kitchen amLODipine (NORVASC) 5 MG tablet Take 5 mg by mouth daily.    Marland Kitchen apixaban (ELIQUIS) 5 MG TABS tablet Take 5 mg by mouth 2 (two) times daily.    Marland Kitchen atorvastatin (LIPITOR) 40 MG tablet Take 40 mg by mouth daily.    . Cholecalciferol 1.25 MG (50000 UT) TABS Take 50,000 Units by mouth daily.    . cyanocobalamin 1000 MCG tablet Take 1,000 mcg by mouth daily.    . flecainide (TAMBOCOR) 100 MG tablet Take 100 mg by mouth every 12 (twelve) hours.    . folic acid (FOLVITE) 1 MG tablet Take 1 mg by mouth daily.    Marland Kitchen gabapentin (NEURONTIN) 300 MG capsule Take 700 mg by mouth at bedtime. Has a rx for gabapentin 300 mg and 100 mg, for a total of 700 mg at bedtime    . ketoconazole (NIZORAL) 2 % shampoo Apply 1 Application topically daily as needed for irritation.    . Lacosamide 100 MG TABS Take 1 tablet (100 mg total) by mouth 2 (two) times daily. 60 tablet 0  . levETIRAcetam (KEPPRA) 750 MG tablet Take 2 tablets (1,500 mg total) by mouth 2 (two) times daily. 120 tablet 0  . LORazepam (ATIVAN) 2 MG tablet Take 2 mg by mouth at bedtime as needed for sleep, sedation, seizure or anxiety.    . magnesium oxide (MAG-OX) 400 (240 Mg) MG tablet Take 1 tablet by mouth 2 (two) times daily.    . metoprolol succinate (TOPROL-XL) 100 MG 24 hr tablet Take 1 tablet (100 mg total) by mouth daily. hold (Patient taking differently: Take 100 mg by mouth daily.)    . pantoprazole (PROTONIX) 40 MG tablet Take 40 mg by mouth daily as needed (acid reflux).    . sodium bicarbonate 650 MG  tablet Take 650 mg by mouth 2 (two) times daily.    . clomiPHENE (CLOMID) 50 MG tablet Take 1/2 tablet daily (Patient not taking: Reported on 08/22/2022) 90 tablet 3  . phenobarbital (LUMINAL) 16.2 MG tablet Take 2 tablets (32.4 mg total) by mouth daily for 2 days, THEN 1 tablet (16.2 mg total) daily for 2 days. (Patient not taking: Reported on 08/22/2022) 6 tablet 0  . sildenafil (REVATIO) 20 MG tablet Take 3-5 tablets 1 hr prior to intercourse as needed (Patient not taking: Reported on 08/22/2022) 60 tablet 3  . valGANciclovir (VALCYTE) 450 MG tablet Take 2 tablets (900 mg total) by mouth 2 (two) times daily. (Patient not taking: Reported on 08/22/2022) 14 tablet 0     Abtx:  Anti-infectives (From admission, onward)    Start     Dose/Rate Route Frequency Ordered Stop   08/22/22 2200  ceFEPIme (MAXIPIME) 2 g in sodium chloride 0.9 % 100 mL IVPB  Status:  Discontinued  2 g 200 mL/hr over 30 Minutes Intravenous Every 8 hours 08/22/22 1553 08/22/22 1618   08/22/22 2000  cefTRIAXone (ROCEPHIN) 2 g in sodium chloride 0.9 % 100 mL IVPB        2 g 200 mL/hr over 30 Minutes Intravenous Every 12 hours 08/22/22 1618     08/22/22 1630  acyclovir (ZOVIRAX) 1,270 mg in dextrose 5 % 250 mL IVPB        10 mg/kg  127 kg 275.4 mL/hr over 60 Minutes Intravenous Every 8 hours 08/22/22 1534     08/22/22 1600  Ampicillin-Sulbactam (UNASYN) 3 g in sodium chloride 0.9 % 100 mL IVPB        3 g 200 mL/hr over 30 Minutes Intravenous Every 6 hours 08/22/22 1553     08/22/22 1500  vancomycin (VANCOREADY) IVPB 1500 mg/300 mL        1,500 mg 150 mL/hr over 120 Minutes Intravenous  Once 08/22/22 1327     08/22/22 1300  ceFEPIme (MAXIPIME) 2 g in sodium chloride 0.9 % 100 mL IVPB        2 g 200 mL/hr over 30 Minutes Intravenous  Once 08/22/22 1245 08/22/22 1352   08/22/22 1300  metroNIDAZOLE (FLAGYL) IVPB 500 mg        500 mg 100 mL/hr over 60 Minutes Intravenous  Once 08/22/22 1245 08/22/22 1458   08/22/22 1300   vancomycin (VANCOCIN) IVPB 1000 mg/200 mL premix        1,000 mg 200 mL/hr over 60 Minutes Intravenous  Once 08/22/22 1245 08/22/22 1637       REVIEW OF SYSTEMS:  Const: negative fever, negative chills, negative weight loss Eyes: negative diplopia or visual changes, negative eye pain ENT: negative coryza, negative sore throat Resp: negative cough, hemoptysis, dyspnea Cards: negative for chest pain, palpitations, lower extremity edema GU: negative for frequency, dysuria and hematuria GI: Negative for abdominal pain, diarrhea, bleeding, constipation Skin: negative for rash and pruritus Heme: negative for easy bruising and gum/nose bleeding MS: negative for myalgias, arthralgias, back pain and muscle weakness Neurolo:negative for headaches, dizziness, vertigo, memory problems  Psych: negative for feelings of anxiety, depression  Endocrine: negative for thyroid, diabetes Allergy/Immunology- negative for any medication or food allergies   Pertinent Positives include : Objective:  VITALS:  BP 115/67   Pulse 83   Temp 97.8 F (36.6 C) (Oral)   Resp (!) 32   Ht 6' (1.829 m)   Wt 127 kg   SpO2 98%   BMI 37.97 kg/m  LDA Foley Central line Other drainage tubes PHYSICAL EXAM:  General: Alert, cooperative, no distress, appears stated age.  Head: Normocephalic, without obvious abnormality, atraumatic. Eyes: Conjunctivae clear, anicteric sclerae. Pupils are equal ENT Nares normal. No drainage or sinus tenderness. Lips, mucosa, and tongue normal. No Thrush Neck: Supple, symmetrical, no adenopathy, thyroid: non tender no carotid bruit and no JVD. Back: No CVA tenderness. Lungs: Clear to auscultation bilaterally. No Wheezing or Rhonchi. No rales. Heart: Regular rate and rhythm, no murmur, rub or gallop. Abdomen: Soft, non-tender,not distended. Bowel sounds normal. No masses Extremities: atraumatic, no cyanosis. No edema. No clubbing Skin: No rashes or lesions. Or bruising Lymph:  Cervical, supraclavicular normal. Neurologic: Grossly non-focal Pertinent Labs Lab Results CBC    Component Value Date/Time   WBC 2.7 (L) 08/22/2022 1027   RBC 4.95 08/22/2022 1027   HGB 16.1 08/22/2022 1027   HCT 48.5 08/22/2022 1027   PLT 128 (L) 08/22/2022 1027   MCV 98.0  08/22/2022 1027   MCH 32.5 08/22/2022 1027   MCHC 33.2 08/22/2022 1027   RDW 13.0 08/22/2022 1027   LYMPHSABS 0.3 (L) 08/22/2022 1027   MONOABS 0.0 (L) 08/22/2022 1027   EOSABS 0.1 08/22/2022 1027   BASOSABS 0.0 08/22/2022 1027       Latest Ref Rng & Units 08/22/2022   10:27 AM 10/24/2021    4:53 AM 10/23/2021    2:44 AM  CMP  Glucose 70 - 99 mg/dL 161  97  96   BUN 6 - 20 mg/dL 23  15  17    Creatinine 0.61 - 1.24 mg/dL 0.96  0.45  4.09   Sodium 135 - 145 mmol/L 139  134  135   Potassium 3.5 - 5.1 mmol/L 4.2  4.5  4.1   Chloride 98 - 111 mmol/L 104  106  105   CO2 22 - 32 mmol/L 24  20  23    Calcium 8.9 - 10.3 mg/dL 9.4  9.3  9.4   Total Protein 6.5 - 8.1 g/dL 7.8   6.3   Total Bilirubin 0.3 - 1.2 mg/dL 1.5   0.8   Alkaline Phos 38 - 126 U/L 127   84   AST 15 - 41 U/L 42   33   ALT 0 - 44 U/L 38   50       Microbiology: Recent Results (from the past 240 hour(s))  Resp panel by RT-PCR (RSV, Flu A&B, Covid) Anterior Nasal Swab     Status: None   Collection Time: 08/22/22 11:04 AM   Specimen: Anterior Nasal Swab  Result Value Ref Range Status   SARS Coronavirus 2 by RT PCR NEGATIVE NEGATIVE Final    Comment: (NOTE) SARS-CoV-2 target nucleic acids are NOT DETECTED.  The SARS-CoV-2 RNA is generally detectable in upper respiratory specimens during the acute phase of infection. The lowest concentration of SARS-CoV-2 viral copies this assay can detect is 138 copies/mL. A negative result does not preclude SARS-Cov-2 infection and should not be used as the sole basis for treatment or other patient management decisions. A negative result may occur with  improper specimen collection/handling, submission  of specimen other than nasopharyngeal swab, presence of viral mutation(s) within the areas targeted by this assay, and inadequate number of viral copies(<138 copies/mL). A negative result must be combined with clinical observations, patient history, and epidemiological information. The expected result is Negative.  Fact Sheet for Patients:  BloggerCourse.com  Fact Sheet for Healthcare Providers:  SeriousBroker.it  This test is no t yet approved or cleared by the [place] FDA and  has been authorized for detection and/or diagnosis of SARS-CoV-2 by FDA under an Emergency Use Authorization (EUA). This EUA will remain  in effect (meaning this test can be used) for the duration of the COVID-19 declaration under Section 564(b)(1) of the Act, 21 U.S.C.section 360bbb-3(b)(1), unless the authorization is terminated  or revoked sooner.       Influenza A by PCR NEGATIVE NEGATIVE Final   Influenza B by PCR NEGATIVE NEGATIVE Final    Comment: (NOTE) The Xpert Xpress SARS-CoV-2/FLU/RSV plus assay is intended as an aid in the diagnosis of influenza from Nasopharyngeal swab specimens and should not be used as a sole basis for treatment. Nasal washings and aspirates are unacceptable for Xpert Xpress SARS-CoV-2/FLU/RSV testing.  Fact Sheet for Patients: BloggerCourse.com  Fact Sheet for Healthcare Providers: SeriousBroker.it  This test is not yet approved or cleared by the [place] and has been authorized  for detection and/or diagnosis of SARS-CoV-2 by FDA under an Emergency Use Authorization (EUA). This EUA will remain in effect (meaning this test can be used) for the duration of the COVID-19 declaration under Section 564(b)(1) of the Act, 21 U.S.C. section 360bbb-3(b)(1), unless the authorization is terminated or revoked.     Resp Syncytial Virus by PCR NEGATIVE NEGATIVE  Final    Comment: (NOTE) Fact Sheet for Patients: BloggerCourse.com  Fact Sheet for Healthcare Providers: SeriousBroker.it  This test is not yet approved or cleared by the [place] FDA and has been authorized for detection and/or diagnosis of SARS-CoV-2 by FDA under an Emergency Use Authorization (EUA). This EUA will remain in effect (meaning this test can be used) for the duration of the COVID-19 declaration under Section 564(b)(1) of the Act, 21 U.S.C. section 360bbb-3(b)(1), unless the authorization is terminated or revoked.  Performed at [facility], [address] [place], [place] [address]     IMAGING RESULTS: I have personally reviewed the films   Impression/Recommendation       ___________________________________________________ Discussed with patient, requesting provider Note:  This document was prepared using Dragon voice recognition software and may include unintentional dictation errors.

## 2022-08-22 NOTE — Consult Note (Signed)
Pharmacy Antibiotic Note  Dustin Wells is a 57 y.o. male admitted on 08/22/2022 with  concern for meningitis .  Patient has a history of HHV-6 encephalitis and seizures, who was treated with valganciclovir in the past. Pharmacy has been consulted for Acyclovir, Vancomycin, and Unasyn dosing. Patient also ordered Rocephin 2g IV Q12 hours.  Plan: Vancomycin 2500mg  IV x 1 given as loading dose, followed by Vancomycin 750mg  Q12 hours per recommended [place] [facility] for treatment of meningitis.  2) Acyclovir 1270 mg(~10mg /kg) Q8 hours  2) Unasyn 3g IV Q6 hours  3) Rocephin 2g IV Q12 hours  Height: 6' (182.9 cm) Weight: 127 kg (280 lb) IBW/kg (Calculated) : 77.6  Temp (24hrs), Avg:98.5 F (36.9 C), Min:97.8 F (36.6 C), Max:99.3 F (37.4 C)  Recent Labs  Lab 08/22/22 1027 08/22/22 1145 08/22/22 1322  WBC 2.7*  --   --   CREATININE 1.65*  --   --   LATICACIDVEN  --  3.0* 2.5*    Estimated Creatinine Clearance: 70.5 mL/min (A) (by C-G formula based on SCr of 1.65 mg/dL (H)).    Allergies  Allergen Reactions   Rivaroxaban Diarrhea, Nausea Only and Other (See Comments)    Nausea, diarrhea, abdominal pain Nausea, diarrhea, abdominal pain Nausea, diarrhea, abdominal pain    Sulfamethoxazole-Trimethoprim Rash    Antimicrobials this admission: Vancomycin 6/3 >>  Acyclovir 6/3 >> Unasyn 6/3 >>  Rocephin 6/3 >>  Dose adjustments this admission: N/A  Microbiology results: 6/3 BCx: collected 6/3 CSF: collected    Thank you for allowing pharmacy to be a part of this patient's care.  Dustin Wells 08/22/2022 3:54 PM

## 2022-08-22 NOTE — H&P (Addendum)
History and Physical    Dustin Wells ZDG:[id number] DOB: 11-05-1965 DOA: 08/22/2022  Referring MD/NP/PA:   PCP: Mick Sell, MD   Patient coming from:  The patient is coming from home.     Chief Complaint: Chills, lower abdominal pain, diarrhea, headache, mild confusion   HPI: Dustin Wells is a 57 y.o. 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  Per his wife at the bedside, the symptoms started this morning.  Patient has chills, shaking, headache, mild neck rigidity, generalized weakness, mild confusion.  No fever.  Temperature is 97.8 80.  Patient is still oriented x 3.  Could answer all questions appropriately.  Patient reports nausea, diarrhea and lower abdominal pain.  No vomiting.  Abdominal pain is mild to moderate, aching, constant, nonradiating.  Patient does not have cough or chest pain, reports mild shortness of breath.  Denies symptoms of UTI. He also reports lower back pain.  Data reviewed independently and ED Course: pt was found to have WBC 2.7, lactic acid 3.0, --> 2.5, procalcitonin 0.18, troponin 8, negative PCR for COVID, flu and RSV, negative urinalysis, renal function close to baseline, temperature normal, blood pressure 121/79, heart rate 94, RR 30s, oxygen saturation 89% on room air, which improved to 95% on 2 L oxygen.  Chest x-ray negative.  CT head is negative for acute intracranial issues, but showed right parietotemporal encephalomalacia. CT of abdomen/pelvis/chest is negative for acute findings.  Patient is admitted to PCU as inpatient.   CT -chest/abd/pelvis: Hepatic steatosis.  Bilateral renal cortical scarring.  Small fat containing periumbilical hernia.  No acute abnormality seen in the abdomen or pelvis.  Aortic  Atherosclerosis (ICD10-I70.0).   EKG: I have personally reviewed.  QTc 608, sinus rhythm, left bundle blockade, poor R wave progression, anteroseptal infarction pattern   Review of Systems:   General: no fevers, has chills, no body weight gain, has fatigue HEENT: no blurry vision, hearing changes or sore throat Respiratory: has dyspnea, no coughing, wheezing CV: no chest pain, no palpitations GI: has nausea and diarrhea, lower abdominal pain, no vomiting or constipation GU: no dysuria, burning on urination, increased urinary frequency, hematuria  Ext: has trace leg edema Neuro: no unilateral weakness, numbness, or tingling, no vision change or hearing loss. Has mild confusion Skin: no rash, no skin tear. MSK: No muscle spasm, no deformity, no limitation of range of movement in spin Heme: No easy bruising.  Travel history: No recent long distant travel.   Allergy:  Allergies  Allergen Reactions   Rivaroxaban Diarrhea, Nausea Only and Other (See Comments)    Nausea, diarrhea, abdominal pain Nausea, diarrhea, abdominal pain Nausea, diarrhea, abdominal pain    Sulfamethoxazole-Trimethoprim Rash    Past Medical History:  Diagnosis Date   Atrial fibrillation (HCC)    CHF (congestive heart failure) (HCC)    Hypertension    Renal disorder    Stroke [facility])    Testicular cancer [facility])     Past Surgical History:  Procedure Laterality Date   cardiac albation     CARDIAC ELECTROPHYSIOLOGY MAPPING AND ABLATION      Social History:  reports that he has never smoked. He has never used smokeless tobacco. He reports current alcohol use of about 21.0 standard drinks of alcohol per week. He reports that he does not currently use drugs.  Family History:  Family History  Problem Relation Age of Onset   Hypertension Mother    Heart disease Father      Prior to Admission medications   Medication Sig Start Date End Date Taking? Authorizing Provider  acetaminophen (TYLENOL) 325 MG  tablet Take 650 mg by mouth every 6 (six) hours as needed for mild pain.    [provider]  allopurinol (ZYLOPRIM) 300 MG tablet Take 300 mg by mouth daily.    [provider]  apixaban (ELIQUIS) 5 MG TABS tablet Take 5 mg by mouth 2 (two) times daily. 03/27/19   [provider]  atorvastatin (LIPITOR) 40 MG tablet Take 40 mg by mouth daily. 02/05/19   [provider]  Cholecalciferol 25 MCG (1000 UT) tablet Take 1,000 Units by mouth daily.    [provider]  clomiPHENE (CLOMID) 50 MG tablet Take 1/2 tablet daily Patient taking differently: Take 25 mg by mouth at bedtime. Take 1/2 tablet daily 09/29/21   Vanna Scotland, MD  cyanocobalamin 1000 MCG tablet Take 1,000 mcg by mouth daily.    [provider]  flecainide (TAMBOCOR) 100 MG tablet Take 100 mg by mouth every 12 (twelve) hours. 02/27/19   [provider]  folic acid (FOLVITE) 1 MG tablet Take by mouth. 03/12/19   [provider]  gabapentin (NEURONTIN) 300 MG capsule Take 1-3 tabs at night 01/05/21   [provider]  Lacosamide 100 MG TABS Take 1 tablet (100 mg total) by mouth 2 (two) times daily. 10/25/21   Tyrone Nine, MD  levETIRAcetam (KEPPRA) 750 MG tablet Take 2 tablets (1,500 mg total) by mouth 2 (two) times daily. 10/25/21   Tyrone Nine, MD  LORazepam (ATIVAN) 2 MG tablet Take 2 mg by mouth at bedtime as needed. 09/30/21   [provider]  magnesium oxide (MAG-OX) 400 (240 Mg) MG tablet Take 1 tablet by mouth 2 (two) times daily. 10/02/21   [provider]  metoprolol succinate (TOPROL-XL) 100 MG 24 hr tablet Take 1 tablet (100 mg total) by mouth daily. hold 10/19/21   Arnetha Courser, MD  OZEMPIC, 2 MG/DOSE, 8 MG/3ML SOPN Inject 2 mg into the skin once a week. Monday 10/12/21   [provider]  pantoprazole (PROTONIX) 40 MG tablet Take 40 mg by mouth daily as needed (acid reflux). 11/17/20   [provider]  phenobarbital  (LUMINAL) 16.2 MG tablet Take 2 tablets (32.4 mg total) by mouth daily for 2 days, THEN 1 tablet (16.2 mg total) daily for 2 days. 10/25/21 10/29/21  Tyrone Nine, MD  sildenafil (REVATIO) 20 MG tablet Take 3-5 tablets 1 hr prior to intercourse as needed Patient taking differently: Take 60-100 mg by mouth every hour as needed (Prior to intercourse). Take 3-5 tablets 1 hr prior to intercourse as needed 09/29/21   Vanna Scotland, MD  sodium bicarbonate 650 MG tablet Take 650 mg by mouth 2 (two) times daily. 05/15/19   [provider]  valGANciclovir (VALCYTE) 450 MG tablet Take 2 tablets (900 mg total) by mouth 2 (two) times daily. 10/25/21   Tyrone Nine, MD    Physical Exam: Vitals:   08/22/22 1028 08/22/22 1200 08/22/22 1349 08/22/22 1400  BP:  (!) 143/78 (!) 116/98 121/79  Pulse:  98 97 94  Resp:  (!) 21 (!) 37 (!) 31  Temp:  98.3 F (36.8 C) 97.8 F (36.6 C)   TempSrc:  Oral Oral   SpO2:  (!) 89% 95% 95%  Weight: 127 kg     Height: 6' (1.829 m)      General: Not in acute distress HEENT:       Eyes: PERRL, EOMI, no jaundice       ENT: No discharge from the ears and nose, no pharynx injection, no tonsillar enlargement.        Neck: No JVD, no bruit, no mass felt. Heme: No neck lymph node enlargement. Cardiac: S1/S2, RRR, No murmurs, No gallops or rubs. Respiratory: No rales, wheezing, rhonchi or rubs. GI: Soft, nondistended, has tenderness in lower abdomen, no rebound pain, no organomegaly, BS present. GU: No hematuria Ext: has pitting leg edema bilaterally. 1+DP/PT pulse bilaterally. Musculoskeletal: No joint deformities, No joint redness or warmth, no limitation of ROM in spin. Skin: No rashes.  Neuro: Has minimal confusion, still oriented X3, answers all questions appropriately, cranial nerves II-XII grossly intact, moves all extremities normally.  Psych: Patient is not psychotic, no suicidal or hemocidal ideation.  Labs on Admission: I have personally reviewed following  labs and imaging studies  CBC: Recent Labs  Lab 08/22/22 1027  WBC 2.7*  NEUTROABS 2.3  HGB 16.1  HCT 48.5  MCV 98.0  PLT 128*   Basic Metabolic Panel: Recent Labs  Lab 08/22/22 1027  NA 139  K 4.2  CL 104  CO2 24  GLUCOSE 104*  BUN 23*  CREATININE 1.65*  CALCIUM 9.4  MG 1.6*  PHOS 2.2*   GFR: Estimated Creatinine Clearance: 70.5 mL/min (A) (by C-G formula based on SCr of 1.65 mg/dL (H)). Liver Function Tests: Recent Labs  Lab 08/22/22 1027  AST 42*  ALT 38  ALKPHOS 127*  BILITOT 1.5*  PROT 7.8  ALBUMIN 4.3   Recent Labs  Lab 08/22/22 1027  LIPASE 33   No results for input(s): "AMMONIA" in the last 168 hours. Coagulation Profile: No results for input(s): "INR", "PROTIME" in the last 168 hours. Cardiac Enzymes: No results for input(s): "CKTOTAL", "CKMB", "CKMBINDEX", "TROPONINI" in the last 168 hours. BNP (last 3 results) No results for input(s): "PROBNP" in the last 8760 hours. HbA1C: No results for input(s): "HGBA1C" in the last 72 hours. CBG: No results for input(s): "GLUCAP" in the last 168 hours. Lipid Profile: No results for input(s): "CHOL", "HDL", "LDLCALC", "TRIG", "CHOLHDL", "LDLDIRECT" in the last 72 hours. Thyroid Function Tests: No results for input(s): "TSH", "T4TOTAL", "FREET4", "T3FREE", "THYROIDAB" in the last 72 hours. Anemia Panel: No results for input(s): "VITAMINB12", "FOLATE", "FERRITIN", "TIBC", "IRON", "RETICCTPCT" in the last 72 hours. Urine analysis:    Component Value Date/Time   COLORURINE YELLOW (A) 08/22/2022 1322   APPEARANCEUR CLEAR (A) 08/22/2022 1322   APPEARANCEUR Clear 09/29/2021 0912   LABSPEC 1.011 08/22/2022 1322   PHURINE 6.0 08/22/2022 1322   GLUCOSEU NEGATIVE 08/22/2022 1322   HGBUR SMALL (A) 08/22/2022 1322   BILIRUBINUR NEGATIVE 08/22/2022 1322   BILIRUBINUR Negative 09/29/2021 0912   KETONESUR NEGATIVE 08/22/2022 1322   PROTEINUR 30 (A) 08/22/2022 1322   NITRITE NEGATIVE 08/22/2022 1322    LEUKOCYTESUR NEGATIVE 08/22/2022 1322   Sepsis Labs: @LABRCNTIP (procalcitonin:4,lacticidven:4) ) Recent Results (from the past 240 hour(s))  Resp panel by RT-PCR (RSV, Flu A&B, Covid) Anterior Nasal Swab     Status: None   Collection Time: 08/22/22 11:04 AM   Specimen: Anterior Nasal Swab  Result Value Ref Range Status   SARS Coronavirus 2 by RT PCR NEGATIVE NEGATIVE Final    Comment: (NOTE) SARS-CoV-2 target nucleic acids are NOT DETECTED.  The SARS-CoV-2 RNA  is generally detectable in upper respiratory specimens during the acute phase of infection. The lowest concentration of SARS-CoV-2 viral copies this assay can detect is 138 copies/mL. A negative result does not preclude SARS-Cov-2 infection and should not be used as the sole basis for treatment or other patient management decisions. A negative result may occur with  improper specimen collection/handling, submission of specimen other than nasopharyngeal swab, presence of viral mutation(s) within the areas targeted by this assay, and inadequate number of viral copies(<138 copies/mL). A negative result must be combined with clinical observations, patient history, and epidemiological information. The expected result is Negative.  Fact Sheet for Patients:  BloggerCourse.com  Fact Sheet for Healthcare Providers:  SeriousBroker.it  This test is no t yet approved or cleared by the [place] FDA and  has been authorized for detection and/or diagnosis of SARS-CoV-2 by FDA under an Emergency Use Authorization (EUA). This EUA will remain  in effect (meaning this test can be used) for the duration of the COVID-19 declaration under Section 564(b)(1) of the Act, 21 U.S.C.section 360bbb-3(b)(1), unless the authorization is terminated  or revoked sooner.       Influenza A by PCR NEGATIVE NEGATIVE Final   Influenza B by PCR NEGATIVE NEGATIVE Final    Comment: (NOTE) The Xpert  Xpress SARS-CoV-2/FLU/RSV plus assay is intended as an aid in the diagnosis of influenza from Nasopharyngeal swab specimens and should not be used as a sole basis for treatment. Nasal washings and aspirates are unacceptable for Xpert Xpress SARS-CoV-2/FLU/RSV testing.  Fact Sheet for Patients: BloggerCourse.com  Fact Sheet for Healthcare Providers: SeriousBroker.it  This test is not yet approved or cleared by the [place] FDA and has been authorized for detection and/or diagnosis of SARS-CoV-2 by FDA under an Emergency Use Authorization (EUA). This EUA will remain in effect (meaning this test can be used) for the duration of the COVID-19 declaration under Section 564(b)(1) of the Act, 21 U.S.C. section 360bbb-3(b)(1), unless the authorization is terminated or revoked.     Resp Syncytial Virus by PCR NEGATIVE NEGATIVE Final    Comment: (NOTE) Fact Sheet for Patients: BloggerCourse.com  Fact Sheet for Healthcare Providers: SeriousBroker.it  This test is not yet approved or cleared by the [place] FDA and has been authorized for detection and/or diagnosis of SARS-CoV-2 by FDA under an Emergency Use Authorization (EUA). This EUA will remain in effect (meaning this test can be used) for the duration of the COVID-19 declaration under Section 564(b)(1) of the Act, 21 U.S.C. section 360bbb-3(b)(1), unless the authorization is terminated or revoked.  Performed at [facility], [address] [place], [place] [address]      Radiological Exams on Admission: CT Head Wo Contrast  Result Date: 08/22/2022 CLINICAL DATA:  Mental status change, unknown cause EXAM: CT HEAD WITHOUT CONTRAST TECHNIQUE: Contiguous axial images were obtained from the base of the skull through the vertex without intravenous contrast. RADIATION DOSE REDUCTION: This exam was performed according to  the departmental dose-optimization program which includes automated exposure control, adjustment of the mA and/or kV according to patient size and/or use of iterative reconstruction technique. COMPARISON:  CT 10/15/2021. FINDINGS: Brain: No evidence of acute large vascular territory infarction, hemorrhage, hydrocephalus, extra-axial collection or mass lesion/mass effect. Right parietotemporal encephalomalacia. Vascular: No definite focal hyperdense vessel. Calcific atherosclerosis. Skull: No acute fracture. Sinuses/Orbits: Inferior right maxillary sinus retention cyst. Otherwise, clear sinuses. No acute findings. Other: No mastoid effusions. IMPRESSION: 1. No evidence of acute intracranial abnormality. 2. Right  parietotemporal encephalomalacia. Electronically Signed   By: Feliberto Harts M.D.   On: 08/22/2022 14:49   CT CHEST ABDOMEN PELVIS W CONTRAST  Result Date: 08/22/2022 CLINICAL DATA:  Sepsis. EXAM: CT CHEST, ABDOMEN, AND PELVIS WITH CONTRAST TECHNIQUE: Multidetector CT imaging of the chest, abdomen and pelvis was performed following the standard protocol during bolus administration of intravenous contrast. RADIATION DOSE REDUCTION: This exam was performed according to the departmental dose-optimization program which includes automated exposure control, adjustment of the mA and/or kV according to patient size and/or use of iterative reconstruction technique. CONTRAST:  OMNIPAQUE IOHEXOL 350 MG/ML SOLN COMPARISON:  September 25, 2021. FINDINGS: CT CHEST FINDINGS Cardiovascular: No significant vascular findings. Normal heart size. No pericardial effusion. Mediastinum/Nodes: No enlarged mediastinal, hilar, or axillary lymph nodes. Thyroid gland, trachea, and esophagus demonstrate no significant findings. Lungs/Pleura: Lungs are clear. No pleural effusion or pneumothorax. Musculoskeletal: No chest wall mass or suspicious bone lesions identified. CT ABDOMEN PELVIS FINDINGS Hepatobiliary: No cholelithiasis or  biliary dilatation. Hepatic steatosis. Pancreas: Unremarkable. No pancreatic ductal dilatation or surrounding inflammatory changes. Spleen: Normal in size without focal abnormality. Adrenals/Urinary Tract: Adrenal glands appear normal. Bilateral renal cortical scarring is noted. Small bilateral renal cysts are noted for which no further follow-up is required. No hydronephrosis or renal obstruction is noted. Urinary bladder is unremarkable. Stomach/Bowel: Stomach is within normal limits. Appendix appears normal. No evidence of bowel wall thickening, distention, or inflammatory changes. Vascular/Lymphatic: Aortic atherosclerosis. No enlarged abdominal or pelvic lymph nodes. Reproductive: Prostate is unremarkable. Other: Small fat containing periumbilical hernia.  No ascites. Musculoskeletal: No acute or significant osseous findings. IMPRESSION: Hepatic steatosis. Bilateral renal cortical scarring. Small fat containing periumbilical hernia. No acute abnormality seen in the abdomen or pelvis. Aortic Atherosclerosis (ICD10-I70.0). Electronically Signed   By: Lupita Raider M.D.   On: 08/22/2022 13:28   DG Chest Portable 1 View  Result Date: 08/22/2022 CLINICAL DATA:  Weakness. Lower abdominal pain, back pain and chills EXAM: PORTABLE CHEST 1 VIEW COMPARISON:  CT AP from 10/15/2021 FINDINGS: Stable cardiomediastinal contours. No pleural effusion or edema. No airspace opacities identified. The visualized osseous structures are unremarkable. IMPRESSION: 1. No acute findings. Electronically Signed   By: Signa Kell M.D.   On: 08/22/2022 12:07      Assessment/Plan Principal Problem:   Encephalitis Active Problems:   Severe sepsis (HCC)   Seizure (HCC)   Diarrhea   Alcohol use disorder, severe, dependence (HCC)   Atrial fibrillation, chronic (HCC)   Chronic diastolic CHF (congestive heart failure) (HCC)   Stage 3b chronic kidney disease (CKD) (HCC)   HTN (hypertension)   Prolonged QT interval   HLD  (hyperlipidemia)   Thrombocytopenia (HCC)   Depression with anxiety   Oxygen desaturation   Obesity with body mass index of 30.0-39.9   Assessment and Plan:   Suspected encephalitis and severe sepsis: pt meets criteria for sepsis with WBC 2.7, RR 37, heart rate 94.  Lactic acid is elevated at 3.0 --> 2.5.  Source for infection is not clear.  Patient has negative urinalysis, negative chest x-ray, negative PCR for COVID, flu and RSV.  Patient has history of HHV-6 encephalitis last year.  Now has mild confusion, mild neck rigidity, suspecting recurrent encephalitis versus meningitis.  Consulted Dr. Rivka Safer of ID.  -will admit to PCU with droplet isolation -IV Dexamethasone (DECADRON) injection 10 mg x 1 -will start IV Vancomycin, rocephin (patient received 1 dose of cefepime in ED) - add IV ampicillin since he is over  81 year old to cover Listeria  - start IV acyclovir -will get Procalcitonin and trend lactic acid level per sepsis protocol -IVF: received 2L of NS bolus in ED, followed by 75 cc/h -Frequent neuro check. -droplet precaution -Follow-up Dr. Lynne Logan recommendation and make adjustment for antibiotic use.  Seizure (HCC) -Seizure precaution -As needed Ativan for seizure -Continue home medications: Keppra, Lamictal  Diarrhea -Follow-up C. difficile and GI pathogen panel  Alcohol use disorder, severe, dependence (HCC) -CIWA protocol  Atrial fibrillation, chronic (HCC): Heart rate 90s. -Hold flecainide due to QTc prolongation of 608 -Continue metoprolol -hold Eliquis today, and resume tomorrow due to LP done  Chronic diastolic CHF (congestive heart failure) (HCC): 2D echo on 10/14/2021 showed EF of 55 to 60%, difficult to assess volume status due to obesity, but patient only has trace leg edema, no significant fluid retention clinically. -Hold diuretics  Stage 3b chronic kidney disease (CKD) (HCC): Renal function close to recent baseline upper limit.  Recent  baseline creatinine 1.3-1.6.  His creatinine is 1.65, BUN 23, GFR 49. -Patient is on IV fluids above -Follow-up with BMP  HTN (hypertension) -Hold amlodipine since patient is at risk of developing hypotension due to severe sepsis -IV hydralazine as needed -Continue metoprolol which is for A-fib rate control  Prolonged QT interval -Hold flecainide -Avoid using QTc prolonging medications, such as Zofran  HLD (hyperlipidemia) -Lipitor  Thrombocytopenia (HCC): This is a chronic issue.  Patient had platelet 129 on 10/16/2021, but normal platelets recently.  His platelet is 128 today. -Follow-up with CBC  Depression with anxiety -Continue home as needed Ativan  Oxygen desaturation: On 2 L oxygen currently.  Etiology is not clear, does not seem to have CHF exacerbation.  Chest x-ray negative.  Low suspicions for PE since patient is taking Eliquis -Supportive care -Nasal cannula oxygen to maintain oxygen saturation above 93% -As needed albuterol and Mucinex  Obesity with body mass index of 30.0-39.9: Body weight 127 kg, BMI 37.97 -Encourage losing weight -Exercise and healthy diet        DVT ppx: SCD, will resume Eliquis tomorrow (due to LP done)  Code Status: Full code   Family Communication:   Yes, his wife at bedside   Disposition Plan:  Anticipate discharge back to previous environment  Consults called:  Dr. Rivka Safer of ID  Admission status and Level of care: Progressive:   as inpt     Dispo: The patient is from: Home              Anticipated d/c is to: Home              Anticipated d/c date is: 2 days              Patient currently is not medically stable to d/c.    Severity of Illness:  The appropriate patient status for this patient is INPATIENT. Inpatient status is judged to be reasonable and necessary in order to provide the required intensity of service to ensure the patient's safety. The patient's presenting symptoms, physical exam findings, and  initial radiographic and laboratory data in the context of their chronic comorbidities is felt to place them at high risk for further clinical deterioration. Furthermore, it is not anticipated that the patient will be medically stable for discharge from the hospital within 2 midnights of admission.   * I certify that at the point of admission it is my clinical judgment that the patient will require inpatient hospital care spanning beyond 2 midnights  from the point of admission due to high intensity of service, high risk for further deterioration and high frequency of surveillance required.*       Date of Service 08/22/2022    Lorretta Harp [facility]   If 7PM-7AM, please contact night-coverage [email or web address] 08/22/2022, 4:30 PM

## 2022-08-22 NOTE — Consult Note (Signed)
NAME: Dustin Wells  DOB: 06-25-1965  MRN: [id number]  Date/Time: 08/22/2022 5:04 PM  REQUESTING PROVIDER: [email or web address] Subjective:  REASON FOR CONSULT:  r/o menigitis ? Dustin Wells is a 57 y.o. male with a history of seizure disorder, HHv6 encephalitis in 2023, testicular cancers/p surgery and radiation, CVA, HTN, CKD Afib on eliquis presents with abdominal pain , shakes and not feeling well Pt says he was feeling okay until yesterday- HE got up this morning and ate brisket and then went to the bathroom and started having lower abdominal pain and was also shaking with chills. Had some back pain and felt weak. His wife called EMS and was brought to the ED. He says he has been drinking vodka 3-4 shots /day and last drink was yesterday night- over the weekend he was working in his yard Vitals in the ED  08/22/22 10:27  BP 111/74  Pulse Rate 95  Resp 18  SpO2 94 %  Temp 99.3  Labs  Latest Reference Range & Units 08/22/22 10:27  WBC 4.0 - 10.5 K/uL 2.7 (L)  Hemoglobin 13.0 - 17.0 g/dL 14.7  HCT 82.9 - 56.2 % 48.5  Platelets 150 - 400 K/uL 128 (L)  Creatinine 0.61 - 1.24 mg/dL 1.30 (H)  Cxr no infiltrate CT head -old encephalomalacia rt fronto parietal and occipital area CT abdomen nothing acute Hepatic steatosis UA no wbc  Covid/flu/rsv neg  He underwent Lp because of h/o HHV6 encephalitis in 2023 Started on 4 antibiotic/antiviral  Latest Reference Range & Units 08/22/22 15:08  Appearance, CSF CLEAR  COLORLESS !  Glucose, CSF 40 - 70 mg/dL 67  RBC Count, CSF 0 - 3 /cu mm 61 (H)  WBC, CSF 0 - 5 /cu mm 6 (H)  Segmented Neutrophils-CSF % 0  Lymphs, CSF % 21  Monocyte-Macrophage-Spinal Fluid % 79  Color, CSF COLORLESS  CLEAR !  Supernatant  CLEAR  Total  Protein, CSF 15 - 45 mg/dL 43  Tube #  3   I am asked to see him to r/o meningitis  Past Medical History:  Diagnosis Date   Atrial fibrillation (HCC)    CHF (congestive heart failure) (HCC)    Hypertension    Renal disorder     Stroke [facility])    Testicular cancer (HCC)     Past Surgical History:  Procedure Laterality Date   cardiac albation     CARDIAC ELECTROPHYSIOLOGY MAPPING AND ABLATION      Social History   Socioeconomic History   Marital status: Married    Spouse name: Not on file   Number of children: Not on file   Years of education: Not on file   Highest education level: Not on file  Occupational History   Not on file  Tobacco Use   Smoking status: Never   Smokeless tobacco: Never  Vaping Use   Vaping Use: Never used  Substance and Sexual Activity   Alcohol use: Yes    Alcohol/week: 21.0 standard drinks of alcohol    Types: 21 Standard drinks or equivalent per week   Drug use: Not Currently   Sexual activity: Not on file  Other Topics Concern   Not on file  Social History Narrative   Not on file   Social Determinants of Health   Financial Resource Strain: Not on file  Food Insecurity: Not on file  Transportation Needs: Not on file  Physical Activity: Not on file  Stress: Not on file  Social Connections: Not on file  Intimate Partner  Violence: Not on file    Family History  Problem Relation Age of Onset   Hypertension Mother    Heart disease Father    Allergies  Allergen Reactions   Rivaroxaban Diarrhea, Nausea Only and Other (See Comments)    Nausea, diarrhea, abdominal pain Nausea, diarrhea, abdominal pain Nausea, diarrhea, abdominal pain    Sulfamethoxazole-Trimethoprim Rash   I? Current Facility-Administered Medications  Medication Dose Route Frequency Provider Last Rate Last Admin   0.9 %  sodium chloride infusion   Intravenous Continuous Lorretta Harp, MD       acetaminophen (TYLENOL) tablet 650 mg  650 mg Oral Q6H PRN Lorretta Harp, MD       acyclovir (ZOVIRAX) 1,270 mg in dextrose 5 % 250 mL IVPB  10 mg/kg Intravenous Q8H Nazari, Walid A, RPH       albuterol (PROVENTIL) (2.5 MG/3ML) 0.083% nebulizer solution 2.5 mg  2.5 mg Inhalation Q4H PRN Lorretta Harp, MD        [START ON 08/23/2022] allopurinol (ZYLOPRIM) tablet 100 mg  100 mg Oral Daily Lorretta Harp, MD       Ampicillin-Sulbactam (UNASYN) 3 g in sodium chloride 0.9 % 100 mL IVPB  3 g Intravenous Q6H Nazari, Walid A, RPH 200 mL/hr at 08/22/22 1658 3 g at 08/22/22 1658   [START ON 08/23/2022] apixaban (ELIQUIS) tablet 5 mg  5 mg Oral BID Lorretta Harp, MD       Melene Muller ON 08/23/2022] atorvastatin (LIPITOR) tablet 40 mg  40 mg Oral Daily Lorretta Harp, MD       cefTRIAXone (ROCEPHIN) 2 g in sodium chloride 0.9 % 100 mL IVPB  2 g Intravenous Q12H Nazari, Walid A, RPH       [START ON 08/23/2022] cyanocobalamin (VITAMIN B12) tablet 1,000 mcg  1,000 mcg Oral Daily Lorretta Harp, MD       dextromethorphan-guaiFENesin (MUCINEX DM) 30-600 MG per 12 hr tablet 1 tablet  1 tablet Oral BID PRN Lorretta Harp, MD       diphenhydrAMINE (BENADRYL) injection 25 mg  25 mg Intravenous Q8H PRN Lorretta Harp, MD       folic acid (FOLVITE) tablet 1 mg  1 mg Oral Daily Lorretta Harp, MD   1 mg at 08/22/22 1652   gabapentin (NEURONTIN) capsule 700 mg  700 mg Oral QHS Lorretta Harp, MD       hydrALAZINE (APRESOLINE) injection 5 mg  5 mg Intravenous Q2H PRN Lorretta Harp, MD       lacosamide (VIMPAT) tablet 100 mg  100 mg Oral BID Lorretta Harp, MD       levETIRAcetam (KEPPRA) tablet 1,500 mg  1,500 mg Oral BID Lorretta Harp, MD       LORazepam (ATIVAN) injection 0-4 mg  0-4 mg Intravenous Q6H Lorretta Harp, MD   2 mg at 08/22/22 1651   Followed by   Melene Muller ON 08/24/2022] LORazepam (ATIVAN) injection 0-4 mg  0-4 mg Intravenous Q12H Lorretta Harp, MD       LORazepam (ATIVAN) tablet 1-4 mg  1-4 mg Oral Q1H PRN Lorretta Harp, MD       Or   LORazepam (ATIVAN) injection 1-4 mg  1-4 mg Intravenous Q1H PRN Lorretta Harp, MD       LORazepam (ATIVAN) injection 2 mg  2 mg Intravenous Q2H PRN Lorretta Harp, MD       LORazepam (ATIVAN) tablet 2 mg  2 mg Oral QHS PRN Lorretta Harp, MD       [START ON 08/23/2022] magnesium  oxide (MAG-OX) tablet 400 mg  400 mg Oral BID Lorretta Harp, MD       [START ON  08/23/2022] metoprolol succinate (TOPROL-XL) 24 hr tablet 100 mg  100 mg Oral Daily Lorretta Harp, MD       multivitamin with minerals tablet 1 tablet  1 tablet Oral Daily Lorretta Harp, MD   1 tablet at 08/22/22 1652   pantoprazole (PROTONIX) EC tablet 40 mg  40 mg Oral Daily PRN Lorretta Harp, MD       sodium bicarbonate tablet 650 mg  650 mg Oral BID Lorretta Harp, MD       thiamine (VITAMIN B1) tablet 100 mg  100 mg Oral Daily Lorretta Harp, MD       Or   thiamine (VITAMIN B1) injection 100 mg  100 mg Intravenous Daily Lorretta Harp, MD   100 mg at 08/22/22 1627   vancomycin (VANCOREADY) IVPB 1500 mg/300 mL  1,500 mg Intravenous Once Orson Aloe, RPH 150 mL/hr at 08/22/22 1626 1,500 mg at 08/22/22 1626   [START ON 08/23/2022] Vitamin D (Ergocalciferol) (DRISDOL) 1.25 MG (50000 UNIT) capsule 50,000 Units  50,000 Units Oral Daily Lorretta Harp, MD       Current Outpatient Medications  Medication Sig Dispense Refill   acetaminophen (TYLENOL) 325 MG tablet Take 650 mg by mouth every 6 (six) hours as needed for mild pain.     allopurinol (ZYLOPRIM) 300 MG tablet Take 100 mg by mouth daily.     amLODipine (NORVASC) 5 MG tablet Take 5 mg by mouth daily.     apixaban (ELIQUIS) 5 MG TABS tablet Take 5 mg by mouth 2 (two) times daily.     atorvastatin (LIPITOR) 40 MG tablet Take 40 mg by mouth daily.     Cholecalciferol 1.25 MG (50000 UT) TABS Take 50,000 Units by mouth daily.     cyanocobalamin 1000 MCG tablet Take 1,000 mcg by mouth daily.     flecainide (TAMBOCOR) 100 MG tablet Take 100 mg by mouth every 12 (twelve) hours.     folic acid (FOLVITE) 1 MG tablet Take 1 mg by mouth daily.     gabapentin (NEURONTIN) 300 MG capsule Take 700 mg by mouth at bedtime. Has a rx for gabapentin 300 mg and 100 mg, for a total of 700 mg at bedtime     ketoconazole (NIZORAL) 2 % shampoo Apply 1 Application topically daily as needed for irritation.     Lacosamide 100 MG TABS Take 1 tablet (100 mg total) by mouth 2 (two) times  daily. 60 tablet 0   levETIRAcetam (KEPPRA) 750 MG tablet Take 2 tablets (1,500 mg total) by mouth 2 (two) times daily. 120 tablet 0   LORazepam (ATIVAN) 2 MG tablet Take 2 mg by mouth at bedtime as needed for sleep, sedation, seizure or anxiety.     magnesium oxide (MAG-OX) 400 (240 Mg) MG tablet Take 1 tablet by mouth 2 (two) times daily.     metoprolol succinate (TOPROL-XL) 100 MG 24 hr tablet Take 1 tablet (100 mg total) by mouth daily. hold (Patient taking differently: Take 100 mg by mouth daily.)     pantoprazole (PROTONIX) 40 MG tablet Take 40 mg by mouth daily as needed (acid reflux).     sodium bicarbonate 650 MG tablet Take 650 mg by mouth 2 (two) times daily.     clomiPHENE (CLOMID) 50 MG tablet Take 1/2 tablet daily (Patient not taking: Reported on 08/22/2022) 90 tablet 3   phenobarbital (  LUMINAL) 16.2 MG tablet Take 2 tablets (32.4 mg total) by mouth daily for 2 days, THEN 1 tablet (16.2 mg total) daily for 2 days. (Patient not taking: Reported on 08/22/2022) 6 tablet 0   sildenafil (REVATIO) 20 MG tablet Take 3-5 tablets 1 hr prior to intercourse as needed (Patient not taking: Reported on 08/22/2022) 60 tablet 3   valGANciclovir (VALCYTE) 450 MG tablet Take 2 tablets (900 mg total) by mouth 2 (two) times daily. (Patient not taking: Reported on 08/22/2022) 14 tablet 0     Abtx:  Anti-infectives (From admission, onward)    Start     Dose/Rate Route Frequency Ordered Stop   08/22/22 2200  ceFEPIme (MAXIPIME) 2 g in sodium chloride 0.9 % 100 mL IVPB  Status:  Discontinued        2 g 200 mL/hr over 30 Minutes Intravenous Every 8 hours 08/22/22 1553 08/22/22 1618   08/22/22 2000  cefTRIAXone (ROCEPHIN) 2 g in sodium chloride 0.9 % 100 mL IVPB        2 g 200 mL/hr over 30 Minutes Intravenous Every 12 hours 08/22/22 1618     08/22/22 1630  acyclovir (ZOVIRAX) 1,270 mg in dextrose 5 % 250 mL IVPB        10 mg/kg  127 kg 275.4 mL/hr over 60 Minutes Intravenous Every 8 hours 08/22/22 1534      08/22/22 1600  Ampicillin-Sulbactam (UNASYN) 3 g in sodium chloride 0.9 % 100 mL IVPB        3 g 200 mL/hr over 30 Minutes Intravenous Every 6 hours 08/22/22 1553     08/22/22 1500  vancomycin (VANCOREADY) IVPB 1500 mg/300 mL        1,500 mg 150 mL/hr over 120 Minutes Intravenous  Once 08/22/22 1327     08/22/22 1300  ceFEPIme (MAXIPIME) 2 g in sodium chloride 0.9 % 100 mL IVPB        2 g 200 mL/hr over 30 Minutes Intravenous  Once 08/22/22 1245 08/22/22 1352   08/22/22 1300  metroNIDAZOLE (FLAGYL) IVPB 500 mg        500 mg 100 mL/hr over 60 Minutes Intravenous  Once 08/22/22 1245 08/22/22 1458   08/22/22 1300  vancomycin (VANCOCIN) IVPB 1000 mg/200 mL premix        1,000 mg 200 mL/hr over 60 Minutes Intravenous  Once 08/22/22 1245 08/22/22 1637       REVIEW OF SYSTEMS:  Const: negative fever, ++ chills, negative weight loss Eyes: negative diplopia or visual changes, negative eye pain ENT: negative coryza, negative sore throat Tooth ache and had a crown placed recently No recent antibiotics Resp: negative cough, hemoptysis, dyspnea Cards: negative for chest pain, palpitations, lower extremity edema GU: negative for frequency, dysuria and hematuria GI: + abdominal pain, one episode of diarrhea Skin: negative for rash and pruritus Heme: negative for easy bruising and gum/nose bleeding MS: general weakness Neurolo:negative for headaches, dizziness, vertigo, memory problems  Psych:  anxiety, depression  Endocrine: ,none Allergy/Immunology- sulfa rash: Objective:  VITALS:  BP 115/67   Pulse 83   Temp 97.8 F (36.6 C) (Oral)   Resp (!) 32   Ht 6' (1.829 m)   Wt 127 kg   SpO2 98%   BMI 37.97 kg/m   PHYSICAL EXAM:  General: Alert, cooperative, oriented x 5 tachypnea  Head: Normocephalic, without obvious abnormality, atraumatic. Eyes: Conjunctivae clear, anicteric sclerae. Pupils are equal ENT Nares normal. No drainage or sinus tenderness. Lips, mucosa, and tongue  normal. No Thrush Neck: Supple, symmetrical, no adenopathy, thyroid: non tender no carotid bruit and no JVD. Back: No CVA tenderness. Lungs: Clear to auscultation bilaterally. No Wheezing or Rhonchi. No rales. Heart: Regular rate and rhythm, no murmur, rub or gallop. Abdomen: Soft, non-tender,not distended. Bowel sounds normal. No masses Extremities: atraumatic, no cyanosis. No edema. No clubbing Skin: No rashes or lesions. Or bruising Lymph: Cervical, supraclavicular normal. Neurologic: Grossly non-focal Pertinent Labs Lab Results CBC    Component Value Date/Time   WBC 2.7 (L) 08/22/2022 1027   RBC 4.95 08/22/2022 1027   HGB 16.1 08/22/2022 1027   HCT 48.5 08/22/2022 1027   PLT 128 (L) 08/22/2022 1027   MCV 98.0 08/22/2022 1027   MCH 32.5 08/22/2022 1027   MCHC 33.2 08/22/2022 1027   RDW 13.0 08/22/2022 1027   LYMPHSABS 0.3 (L) 08/22/2022 1027   MONOABS 0.0 (L) 08/22/2022 1027   EOSABS 0.1 08/22/2022 1027   BASOSABS 0.0 08/22/2022 1027       Latest Ref Rng & Units 08/22/2022   10:27 AM 10/24/2021    4:53 AM 10/23/2021    2:44 AM  CMP  Glucose 70 - 99 mg/dL 161  97  96   BUN 6 - 20 mg/dL 23  15  17    Creatinine 0.61 - 1.24 mg/dL 0.96  0.45  4.09   Sodium 135 - 145 mmol/L 139  134  135   Potassium 3.5 - 5.1 mmol/L 4.2  4.5  4.1   Chloride 98 - 111 mmol/L 104  106  105   CO2 22 - 32 mmol/L 24  20  23    Calcium 8.9 - 10.3 mg/dL 9.4  9.3  9.4   Total Protein 6.5 - 8.1 g/dL 7.8   6.3   Total Bilirubin 0.3 - 1.2 mg/dL 1.5   0.8   Alkaline Phos 38 - 126 U/L 127   84   AST 15 - 41 U/L 42   33   ALT 0 - 44 U/L 38   50       Microbiology: Recent Results (from the past 240 hour(s))  Resp panel by RT-PCR (RSV, Flu A&B, Covid) Anterior Nasal Swab     Status: None   Collection Time: 08/22/22 11:04 AM   Specimen: Anterior Nasal Swab  Result Value Ref Range Status   SARS Coronavirus 2 by RT PCR NEGATIVE NEGATIVE Final    Comment: (NOTE) SARS-CoV-2 target nucleic acids are NOT  DETECTED.  The SARS-CoV-2 RNA is generally detectable in upper respiratory specimens during the acute phase of infection. The lowest concentration of SARS-CoV-2 viral copies this assay can detect is 138 copies/mL. A negative result does not preclude SARS-Cov-2 infection and should not be used as the sole basis for treatment or other patient management decisions. A negative result may occur with  improper specimen collection/handling, submission of specimen other than nasopharyngeal swab, presence of viral mutation(s) within the areas targeted by this assay, and inadequate number of viral copies(<138 copies/mL). A negative result must be combined with clinical observations, patient history, and epidemiological information. The expected result is Negative.  Fact Sheet for Patients:  BloggerCourse.com  Fact Sheet for Healthcare Providers:  SeriousBroker.it  This test is no t yet approved or cleared by the [place] FDA and  has been authorized for detection and/or diagnosis of SARS-CoV-2 by FDA under an Emergency Use Authorization (EUA). This EUA will remain  in effect (meaning this test can be used) for the duration of the  COVID-19 declaration under Section 564(b)(1) of the Act, 21 U.S.C.section 360bbb-3(b)(1), unless the authorization is terminated  or revoked sooner.       Influenza A by PCR NEGATIVE NEGATIVE Final   Influenza B by PCR NEGATIVE NEGATIVE Final    Comment: (NOTE) The Xpert Xpress SARS-CoV-2/FLU/RSV plus assay is intended as an aid in the diagnosis of influenza from Nasopharyngeal swab specimens and should not be used as a sole basis for treatment. Nasal washings and aspirates are unacceptable for Xpert Xpress SARS-CoV-2/FLU/RSV testing.  Fact Sheet for Patients: BloggerCourse.com  Fact Sheet for Healthcare Providers: SeriousBroker.it  This test is not yet  approved or cleared by the [place] FDA and has been authorized for detection and/or diagnosis of SARS-CoV-2 by FDA under an Emergency Use Authorization (EUA). This EUA will remain in effect (meaning this test can be used) for the duration of the COVID-19 declaration under Section 564(b)(1) of the Act, 21 U.S.C. section 360bbb-3(b)(1), unless the authorization is terminated or revoked.     Resp Syncytial Virus by PCR NEGATIVE NEGATIVE Final    Comment: (NOTE) Fact Sheet for Patients: BloggerCourse.com  Fact Sheet for Healthcare Providers: SeriousBroker.it  This test is not yet approved or cleared by the [place] FDA and has been authorized for detection and/or diagnosis of SARS-CoV-2 by FDA under an Emergency Use Authorization (EUA). This EUA will remain in effect (meaning this test can be used) for the duration of the COVID-19 declaration under Section 564(b)(1) of the Act, 21 U.S.C. section 360bbb-3(b)(1), unless the authorization is terminated or revoked.  Performed at [facility], 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   Impression/Recommendation ?pt presenting with sudden onset weakness, chills, abdominal pain Clinically and by history nothing to suggest meningitis or encephalitis LP - near bland csf- only 6 wbc and notmal glucose- CSF ME panel negative except for HHV6 DC vanco, acyclovir and change ceftriaxone to qd  No Uti No pneumonia   HHV6 in csf- This virus is integrated in the DNA and hence can be present- CSF analysis bland with no indication of encephalitis or meningitis He was treated last year with ganciclovir X 2 weeks No treatment needed   Sz disorder on keppra and lacosamide  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 ? ___________________________________________________ Discussed with patient, requesting provider

## 2022-08-22 NOTE — ED Triage Notes (Signed)
Arrives from home via [facility].  Called for shaking, body aches and chills.  Per report, shaking improved with breathing exercises.  CBG:  128. 136/107 98.1 temp 98% RA RR:25

## 2022-08-22 NOTE — ED Notes (Signed)
This RN was notified by pt's family that pt was wet because they were did not realized that they needed to urinate until it was too late. Pt's linens, chuck pad and blanket were changed.

## 2022-08-22 NOTE — Sepsis Progress Note (Signed)
Elink monitoring for the code sepsis protocol.  

## 2022-08-22 NOTE — Progress Notes (Signed)
PHARMACY - PHYSICIAN COMMUNICATION  CRITICAL VALUE ALERT - Meningitis / Encephalitis Panel  Dustin Wells is an 57 y.o. male who presented to [facility] on 08/22/2022 with a chief complaint of chills, lower abdominal pain, diarrhea, headache, and mild confusion  Assessment: Lumbar puncture performed for concerns of meningitis / encephalitis.   Name of physician (or Provider) Contacted: Valente David  Current anti-infectives: Acyclovir, Vancomycin, Ceftriaxone, Unasyn  Changes to prescribed anti-infectives recommended:  CSF positive for HHV6. Currently on acyclovir. Patient also tested positive in July 2023. Will continue current treatment plan, plan for ID to see patient in AM and adjust regimen as deemed appropriate.    Results for orders placed or performed during the hospital encounter of 08/22/22  Meningitis/Encephalitis Panel (CSF) (Collected: 08/22/2022  3:08 PM)  Result Value Ref Range   Cryptococcus neoformans/gattii (CSF) NOT DETECTED NOT DETECTED   Cytomegalovirus (CSF) NOT DETECTED NOT DETECTED   Enterovirus (CSF) NOT DETECTED NOT DETECTED   Escherichia coli K1 (CSF) NOT DETECTED NOT DETECTED   Haemophilus influenzae (CSF) NOT DETECTED NOT DETECTED   Herpes simplex virus 1 (CSF) NOT DETECTED NOT DETECTED   Herpes simplex virus 2 (CSF) NOT DETECTED NOT DETECTED   Human herpesvirus 6 (CSF) DETECTED (A) NOT DETECTED   Human parechovirus (CSF) NOT DETECTED NOT DETECTED   Listeria monocytogenes (CSF) NOT DETECTED NOT DETECTED   Neisseria meningitis (CSF) NOT DETECTED NOT DETECTED   Streptococcus agalactiae (CSF) NOT DETECTED NOT DETECTED   Streptococcus pneumoniae (CSF) NOT DETECTED NOT DETECTED   Varicella zoster virus (CSF) NOT DETECTED NOT DETECTED    Tashima Scarpulla A Twana Wileman 08/22/2022  10:03 PM

## 2022-08-22 NOTE — Consult Note (Signed)
PHARMACY - BRIEF ANTIBIOTIC NOTE   Pharmacy has received consult(s) for cefepime and vancomycin from an ED provider. The patient's profile has been reviewed for ht/wt/allergies/indication/available labs.    One time order(s) placed for cefepime 2 g IV and vancomycin 1000 mg IV x 1. Will supplement vancomycin with an additional order of vancomycin 1500 mg IV x 1 for a total load of 2500 mg.  Further antibiotics/pharmacy consults should be ordered by admitting physician if indicated.                       Thank you,  Will M. Dareen Piano, PharmD PGY-1 Pharmacy Resident 08/22/2022 12:47 PM

## 2022-08-22 NOTE — Progress Notes (Signed)
Critical care note:  Date of note: 08/22/2022.  Subjective: The patient has acute overall mental status with decreased responsiveness.  He was having 5 coming out of his mouth.  He was fairly somnolent and snoring.  His pulse oximetry was down to 91% on room air.  No fever or chills.  No reported chest pain or cough or wheezing.  Objective: Physical examination: Generally: Acutely ill obese middle-aged Caucasian male in mild respiratory distress.  He was very somnolent and snoring.  He was arousable though but would fall asleep easily and seemed globally confused and lethargic. Vital signs, respirate was 22 and BP 131/82 with heart rate of 71, temperature 98.7 and pulse 7092% on 2 L of O2 by nasal cannula Head - atraumatic, normocephalic.  Pupils - equal, round and reactive to light and accommodation. Extraocular movements are intact. No scleral icterus.  Oropharynx - moist mucous membranes and tongue. No pharyngeal erythema or exudate.  Neck - supple. No JVD. Carotid pulses 2+ bilaterally. No carotid bruits. No palpable thyromegaly or lymphadenopathy. Cardiovascular - regular rate and rhythm. Normal S1 and S2. No murmurs, gallops or rubs.  Lungs - clear to auscultation bilaterally.  Abdomen - soft and nontender. Positive bowel sounds. No palpable organomegaly or masses.  Extremities - no pitting edema, clubbing or cyanosis.  Neuro - grossly non-focal.  He was globally confused and lethargic but able to follow commands. Skin - no rashes. GU and rectal exam - deferred.   Labs were all reviewed.  CSF culture came back positive for human herpes virus 6 Blood culture 1 out of 4 back positive for E. coli and Enterobacterales.  Assessment/plan: 1.  Acute metabolic encephalopathy like secondary to hepatic encephalitis in the setting of E. coli bacteremia. - We will obtain a stat head CT scan given his acute altered mental status. - We will place him in a stepdown bed. - We will follow his blood  cultures. - Will continue antibiotic therapy with IV Rocephin and vancomycin.  His Unasyn will be held off. - We will continue his antiviral therapy with IV acyclovir. - We will follow neurochecks every 4 hours for 24 hours. - We will continue monitoring him closely tonight.  Authorized and performed by: Valente David, MD Total critical care time:   35    minutes. Due to a high probability of clinically significant, life-threatening deterioration, the patient required my highest level of preparedness to intervene emergently and I personally spent this critical care time directly and personally managing the patient.  This critical care time included obtaining a history, examining the patient, pulse oximetry, ordering and review of studies, arranging urgent treatment with development of management plan, evaluation of patient's response to treatment, frequent reassessment, and discussions with other providers. This critical care time was performed to assess and manage the high probability of imminent, life-threatening deterioration that could result in multiorgan failure.  It was exclusive of separately billable procedures and treating other patients and teaching time.

## 2022-08-22 NOTE — ED Notes (Signed)
Pt to CT

## 2022-08-22 NOTE — ED Notes (Signed)
Pulse ox is fluxuating from 88-92 on RA. Per wife he has sleep apnea.  Placed on 2 liters canula

## 2022-08-22 NOTE — Consult Note (Signed)
CODE SEPSIS - PHARMACY COMMUNICATION  **Broad-spectrum antimicrobials should be administered within one hour of sepsis diagnosis**  Time Code Sepsis call or page was received: 1245  Antibiotics ordered: Cefepime, Vancomycin, Metronidazole  Time of first antibiotic administration: 1319  Additional action taken by pharmacy: N/A  If necessary, name of provider/nurse contacted: N/A    Will M. Dareen Piano, PharmD PGY-1 Pharmacy Resident 08/22/2022 12:46 PM

## 2022-08-22 NOTE — ED Provider Notes (Signed)
[facility] Provider Note    Event Date/Time   First MD Initiated Contact with Patient 08/22/22 1054     (approximate)   History   Chief Complaint Abdominal Pain   HPI  Dustin Wells is a 57 y.o. male with past medical history of hypertension, hyperlipidemia, CKD, atrial fibrillation on Eliquis, seizures, CHF, and alcohol abuse who presents to the ED complaining of abdominal pain.  Patient reports he was feeling fine when he went to bed last night, attempted to go to the bathroom this morning and had onset of pain in the middle of his lower back as well as in his abdomen.  He describes the pain as sharp, denies any recent trauma, does state he has had back issues in the past.  It has been associated with nausea, weakness, and chills.  He is not aware of any fevers but has been feeling very weak this morning.  He does admit to daily alcohol consumption, about 4 "cocktails" daily with his last drink coming at 9 PM last night.  He denies any recent sick contacts, does report some diarrhea this morning.     Physical Exam   Triage Vital Signs: ED Triage Vitals  Enc Vitals Group     BP 08/22/22 1027 111/74     Pulse Rate 08/22/22 1027 95     Resp 08/22/22 1027 18     Temp 08/22/22 1024 99.3 F (37.4 C)     Temp Source 08/22/22 1024 Oral     SpO2 08/22/22 1027 94 %     Weight 08/22/22 1028 280 lb (127 kg)     Height 08/22/22 1028 6' (1.829 m)     Head Circumference --      Peak Flow --      Pain Score 08/22/22 1028 10     Pain Loc --      Pain Edu? --      Excl. in GC? --     Most recent vital signs: Vitals:   08/22/22 1349 08/22/22 1400  BP: (!) 116/98 121/79  Pulse: 97 94  Resp: (!) 37 (!) 31  Temp: 97.8 F (36.6 C)   SpO2: 95% 95%    Constitutional: Alert and oriented. Eyes: Conjunctivae are normal. Head: Atraumatic. Nose: No congestion/rhinnorhea. Mouth/Throat: Mucous membranes are dry. Neck: Supple with no meningismus. Cardiovascular:  Normal rate, regular rhythm. Grossly normal heart sounds.  2+ radial pulses bilaterally. Respiratory: Normal respiratory effort.  No retractions. Lungs CTAB. Gastrointestinal: Soft and tender to palpation in the bilateral lower quadrants, greatest in the left lower quadrant.  No CVA tenderness bilaterally. No distention. Musculoskeletal: No lower extremity tenderness nor edema.  Neurologic:  Normal speech and language. No gross focal neurologic deficits are appreciated.    ED Results / Procedures / Treatments   Labs (all labs ordered are listed, but only abnormal results are displayed) Labs Reviewed  CBC WITH DIFFERENTIAL/PLATELET - Abnormal; Notable for the following components:      Result Value   WBC 2.7 (*)    Platelets 128 (*)    Lymphs Abs 0.3 (*)    Monocytes Absolute 0.0 (*)    All other components within normal limits  COMPREHENSIVE METABOLIC PANEL - Abnormal; Notable for the following components:   Glucose, Bld 104 (*)    BUN 23 (*)    Creatinine, Ser 1.65 (*)    AST 42 (*)    Alkaline Phosphatase 127 (*)    Total Bilirubin 1.5 (*)  GFR, Estimated 49 (*)    All other components within normal limits  URINALYSIS, ROUTINE W REFLEX MICROSCOPIC - Abnormal; Notable for the following components:   Color, Urine YELLOW (*)    APPearance CLEAR (*)    Hgb urine dipstick SMALL (*)    Protein, ur 30 (*)    All other components within normal limits  MAGNESIUM - Abnormal; Notable for the following components:   Magnesium 1.6 (*)    All other components within normal limits  LACTIC ACID, PLASMA - Abnormal; Notable for the following components:   Lactic Acid, Venous 3.0 (*)    All other components within normal limits  LACTIC ACID, PLASMA - Abnormal; Notable for the following components:   Lactic Acid, Venous 2.5 (*)    All other components within normal limits  RESP PANEL BY RT-PCR (RSV, FLU A&B, COVID)  RVPGX2  CULTURE, BLOOD (ROUTINE X 2)  CULTURE, BLOOD (ROUTINE X 2)   CSF CULTURE W GRAM STAIN  LIPASE, BLOOD  PROCALCITONIN  AMMONIA  CSF CELL COUNT WITH DIFFERENTIAL  PROTEIN AND GLUCOSE, CSF  HSV 1/2 PCR, CSF  CRYPTOCOCCAL ANTIGEN, CSF  PH, BODY FLUID  PHOSPHORUS  BRAIN NATRIURETIC PEPTIDE  PROTIME-INR  APTT  MISC [facility] TEST (SEND OUT)  TROPONIN I (HIGH SENSITIVITY)     EKG  ED ECG REPORT I, Chesley Noon, the attending physician, personally viewed and interpreted this ECG.   Date: 08/22/2022  EKG Time: 12:53  Rate: 100  Rhythm: atrial fibrillation  Axis: Normal  Intervals:left bundle branch block  ST&T Change: None  RADIOLOGY Chest x-ray reviewed and interpreted by me with no infiltrate, edema, or effusion.  PROCEDURES:  Critical Care performed: Yes, see critical care procedure note(s)  .Critical Care  Performed by: Chesley Noon, MD Authorized by: Chesley Noon, MD   Critical care provider statement:    Critical care time (minutes):  30   Critical care time was exclusive of:  Separately billable procedures and treating other patients and teaching time   Critical care was necessary to treat or prevent imminent or life-threatening deterioration of the following conditions:  Sepsis   Critical care was time spent personally by me on the following activities:  Development of treatment plan with patient or surrogate, discussions with consultants, evaluation of patient's response to treatment, examination of patient, ordering and review of laboratory studies, ordering and review of radiographic studies, ordering and performing treatments and interventions, pulse oximetry, re-evaluation of patient's condition and review of old charts   I assumed direction of critical care for this patient from another provider in my specialty: no     Care discussed with: admitting provider   .Lumbar Puncture  Date/Time: 08/22/2022 3:33 PM  Performed by: Chesley Noon, MD Authorized by: Chesley Noon, MD   Consent:    Consent obtained:   Verbal   Consent given by:  Patient and spouse   Risks, benefits, and alternatives were discussed: yes     Risks discussed:  Bleeding, headache, nerve damage, repeat procedure, pain and infection Universal protocol:    Patient identity confirmed:  Arm band and verbally with patient Pre-procedure details:    Procedure purpose:  Diagnostic   Preparation: Patient was prepped and draped in usual sterile fashion   Anesthesia:    Anesthesia method:  Local infiltration   Local anesthetic:  Lidocaine 1% w/o epi Procedure details:    Lumbar space:  L4-L5 interspace   Patient position:  Sitting   Needle gauge:  22  Needle type:  Spinal needle - Quincke tip   Needle length (in):  3.5   Ultrasound guidance: no     Number of attempts:  1   Fluid appearance:  Blood-tinged   Tubes of fluid:  4   Total volume (ml):  5 Post-procedure details:    Puncture site:  Direct pressure applied and adhesive bandage applied   Procedure completion:  Tolerated well, no immediate complications    MEDICATIONS ORDERED IN ED: Medications  vancomycin (VANCOCIN) IVPB 1000 mg/200 mL premix (1,000 mg Intravenous New Bag/Given 08/22/22 1535)  vancomycin (VANCOREADY) IVPB 1500 mg/300 mL (has no administration in time range)  dexamethasone (DECADRON) injection 10 mg (has no administration in time range)  0.9 %  sodium chloride infusion (has no administration in time range)  diphenhydrAMINE (BENADRYL) injection 25 mg (has no administration in time range)  hydrALAZINE (APRESOLINE) injection 5 mg (has no administration in time range)  acetaminophen (TYLENOL) tablet 650 mg (has no administration in time range)  albuterol (PROVENTIL) (2.5 MG/3ML) 0.083% nebulizer solution 2.5 mg (has no administration in time range)  dextromethorphan-guaiFENesin (MUCINEX DM) 30-600 MG per 12 hr tablet 1 tablet (has no administration in time range)  LORazepam (ATIVAN) tablet 1-4 mg (has no administration in time range)    Or  LORazepam  (ATIVAN) injection 1-4 mg (has no administration in time range)  thiamine (VITAMIN B1) tablet 100 mg (has no administration in time range)    Or  thiamine (VITAMIN B1) injection 100 mg (has no administration in time range)  folic acid (FOLVITE) tablet 1 mg (has no administration in time range)  multivitamin with minerals tablet 1 tablet (has no administration in time range)  LORazepam (ATIVAN) injection 0-4 mg (has no administration in time range)    Followed by  LORazepam (ATIVAN) injection 0-4 mg (has no administration in time range)  LORazepam (ATIVAN) injection 2 mg (has no administration in time range)  acyclovir (ZOVIRAX) 1,270 mg in dextrose 5 % 250 mL IVPB (has no administration in time range)  sodium chloride 0.9 % bolus 1,000 mL (0 mLs Intravenous Stopped 08/22/22 1515)  magnesium sulfate IVPB 2 g 50 mL (0 g Intravenous Stopped 08/22/22 1240)  LORazepam (ATIVAN) injection 2 mg (2 mg Intravenous Given 08/22/22 1156)  iohexol (OMNIPAQUE) 350 MG/ML injection 100 mL (100 mLs Intravenous Contrast Given 08/22/22 1259)  sodium chloride 0.9 % bolus 1,000 mL (0 mLs Intravenous Stopped 08/22/22 1507)  ceFEPIme (MAXIPIME) 2 g in sodium chloride 0.9 % 100 mL IVPB (0 g Intravenous Stopped 08/22/22 1352)  metroNIDAZOLE (FLAGYL) IVPB 500 mg (0 mg Intravenous Stopped 08/22/22 1458)     IMPRESSION / MDM / ASSESSMENT AND PLAN / ED COURSE  I reviewed the triage vital signs and the nursing notes.                              57 y.o. male with past medical history of hypertension, hyperlipidemia, CKD, atrial fibrillation on Eliquis, diastolic CHF, seizures, and alcohol abuse who presents to the ED for weakness, chills, back pain, and abdominal pain starting earlier this morning.  Patient's presentation is most consistent with acute presentation with potential threat to life or bodily function.  Differential diagnosis includes, but is not limited to, sepsis, UTI, pneumonia, dehydration, electrolyte abnormality,  AKI, diverticulitis, appendicitis, kidney stone, viral syndrome.  Patient ill-appearing, temperature with mild elevation at 99.3 in triage but remainder vital signs are reassuring.  Patient  has tenderness to palpation of the bilateral lower quadrants of his abdomen on exam, initial labs remarkable for leukopenia.  With his chills, ill appearance, and leukopenia, we will initiate sepsis workup.  Plan to check chest x-ray, urinalysis, and CT of his abdomen/pelvis.  He complains of lower back pain but no recent trauma and no findings concerning for cauda equina.  Viral testing for influenza and COVID-19 pending at this time.  Patient also may be developing alcohol withdrawal and we will treat with IV Ativan, hydrate with IV fluids.  Chest x-ray is unremarkable, urinalysis shows no signs of infection.  Patient with mild AKI but no acute electrolyte abnormality, LFTs are unremarkable.  EKG with new left bundle branch block but patient denies chest pain and troponin within normal limits.  CT abdomen/pelvis performed and negative for acute process, no clear source of infection.  Patient became increasingly confused but CT head is negative for acute process.  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  LP was performed and results are pending at this time, miscellaneous order placed for HHV-6 testing per lab.  Case discussed with hospitalist for admission.      FINAL CLINICAL IMPRESSION(S) / ED DIAGNOSES   Final diagnoses:  Sepsis without acute organ dysfunction, due to unspecified organism (HCC)  Altered mental status, unspecified altered mental status type     Rx / DC Orders   ED Discharge Orders     None        Note:  This document was prepared using Dragon voice recognition software and may include unintentional dictation errors.   Chesley Noon, MD 08/22/22 [phone]

## 2022-08-22 NOTE — ED Provider Triage Note (Signed)
Emergency Medicine Provider Triage Evaluation Note  Dustin Wells , a 57 y.o. male  was evaluated in triage.  Pt complains of lower abdominal pain.  Symptoms started today.  No fever.  Some nausea and diarrhea.  Patient has stage III kidney disease.  Review of Systems  Positive: Negative:   Physical Exam  BP 111/74 (BP Location: Right Arm)   Pulse 95   Temp 99.3 F (37.4 C) (Oral)   Resp 18   SpO2 94%  Gen:   Awake, no distress   Resp:  Normal effort  MSK:   Moves extremities without difficulty  Other:    Medical Decision Making  Medically screening exam initiated at 10:29 AM.  Appropriate orders placed.  Dustin Wells was informed that the remainder of the evaluation will be completed by another provider, this initial triage assessment does not replace that evaluation, and the importance of remaining in the ED until their evaluation is complete.     Faythe Ghee, PA-C 08/22/22 1030

## 2022-08-22 NOTE — ED Triage Notes (Signed)
Patient c/o lower abdominal pain, back pain, chills, shaking, and nausea. Denies LOC  Denies fevers  Takes eliquis daily. History stroke 2018 and a-fib

## 2022-08-22 NOTE — ED Notes (Signed)
Dr. Larinda Buttery at bedside to do LP.

## 2022-08-23 ENCOUNTER — Inpatient Hospital Stay: Payer: BC Managed Care – PPO

## 2022-08-23 DIAGNOSIS — R7881 Bacteremia: Secondary | ICD-10-CM

## 2022-08-23 DIAGNOSIS — A4151 Sepsis due to Escherichia coli [E. coli]: Secondary | ICD-10-CM | POA: Diagnosis not present

## 2022-08-23 DIAGNOSIS — N179 Acute kidney failure, unspecified: Secondary | ICD-10-CM | POA: Diagnosis not present

## 2022-08-23 DIAGNOSIS — A419 Sepsis, unspecified organism: Secondary | ICD-10-CM | POA: Diagnosis present

## 2022-08-23 DIAGNOSIS — N189 Chronic kidney disease, unspecified: Secondary | ICD-10-CM

## 2022-08-23 DIAGNOSIS — R6520 Severe sepsis without septic shock: Secondary | ICD-10-CM | POA: Diagnosis not present

## 2022-08-23 DIAGNOSIS — G0490 Encephalitis and encephalomyelitis, unspecified: Secondary | ICD-10-CM | POA: Diagnosis not present

## 2022-08-23 DIAGNOSIS — D72829 Elevated white blood cell count, unspecified: Secondary | ICD-10-CM

## 2022-08-23 LAB — GASTROINTESTINAL PANEL BY PCR, STOOL (REPLACES STOOL CULTURE)

## 2022-08-23 LAB — BASIC METABOLIC PANEL
Anion gap: 11 (ref 5–15)
BUN: 22 mg/dL — ABNORMAL HIGH (ref 6–20)
CO2: 18 mmol/L — ABNORMAL LOW (ref 22–32)
Calcium: 8.6 mg/dL — ABNORMAL LOW (ref 8.9–10.3)
Chloride: 106 mmol/L (ref 98–111)
Creatinine, Ser: 1.76 mg/dL — ABNORMAL HIGH (ref 0.61–1.24)
GFR, Estimated: 45 mL/min — ABNORMAL LOW (ref >60)
Glucose, Bld: 217 mg/dL — ABNORMAL HIGH (ref 70–99)
Potassium: 4.5 mmol/L (ref 3.5–5.1)
Sodium: 135 mmol/L (ref 135–145)

## 2022-08-23 LAB — LACTIC ACID, PLASMA
Lactic Acid, Venous: 2.5 mmol/L (ref 0.5–1.9)
Lactic Acid, Venous: 2.8 mmol/L (ref 0.5–1.9)

## 2022-08-23 LAB — CBC
HCT: 44.3 % (ref 39.0–52.0)
Hemoglobin: 14.7 g/dL (ref 13.0–17.0)
MCH: 32.4 pg (ref 26.0–34.0)
MCHC: 33.2 g/dL (ref 30.0–36.0)
MCV: 97.6 fL (ref 80.0–100.0)
Platelets: 106 10*3/uL — ABNORMAL LOW (ref 150–400)
RBC: 4.54 MIL/uL (ref 4.22–5.81)
RDW: 13.2 % (ref 11.5–15.5)
WBC: 14.6 10*3/uL — ABNORMAL HIGH (ref 4.0–10.5)
nRBC: 0 % (ref 0.0–0.2)

## 2022-08-23 LAB — HSV 1/2 PCR, CSF
HSV-1 DNA: NEGATIVE
HSV-2 DNA: NEGATIVE

## 2022-08-23 LAB — CRYPTOCOCCAL ANTIGEN, CSF: Crypto Ag: NEGATIVE

## 2022-08-23 LAB — CULTURE, BLOOD (ROUTINE X 2)

## 2022-08-23 LAB — C DIFFICILE QUICK SCREEN W PCR REFLEX
C Diff antigen: NEGATIVE
C Diff interpretation: NOT DETECTED
C Diff toxin: NEGATIVE

## 2022-08-23 LAB — MRSA NEXT GEN BY PCR, NASAL: MRSA by PCR Next Gen: NOT DETECTED

## 2022-08-23 LAB — PHOSPHORUS: Phosphorus: 2.6 mg/dL (ref 2.5–4.6)

## 2022-08-23 LAB — GLUCOSE, CAPILLARY: Glucose-Capillary: 205 mg/dL — ABNORMAL HIGH (ref 70–99)

## 2022-08-23 LAB — CSF CULTURE W GRAM STAIN

## 2022-08-23 LAB — MAGNESIUM: Magnesium: 1.8 mg/dL (ref 1.7–2.4)

## 2022-08-23 MED ORDER — CHLORHEXIDINE GLUCONATE CLOTH 2 % EX PADS: 6.0000 | MEDICATED_PAD | Freq: Every day | CUTANEOUS | Status: DC

## 2022-08-23 MED ORDER — ORAL CARE MOUTH RINSE: 15.0000 mL | OROMUCOSAL | Status: DC | PRN

## 2022-08-23 MED ORDER — SODIUM CHLORIDE 0.9 % IV SOLN: INTRAVENOUS | Status: DC

## 2022-08-23 NOTE — Assessment & Plan Note (Signed)
E.coli (EPEC) infection --Continue Rocephin --Enteric precautions --Diuretics on hold --Monitor volumes status & electrolytes

## 2022-08-23 NOTE — Progress Notes (Addendum)
Date of Admission:  08/22/2022   T  ID: Dustin Wells is a 57 y.o. male Principal Problem:   Encephalitis Active Problems:   Alcohol use disorder, severe, dependence (HCC)   Stage 3b chronic kidney disease (CKD) (HCC)   Atrial fibrillation, chronic (HCC)   Depression with anxiety   Severe sepsis (HCC)   Thrombocytopenia (HCC)   HTN (hypertension)   HLD (hyperlipidemia)   Obesity with body mass index of 30.0-39.9   Chronic diastolic CHF (congestive heart failure) (HCC)   Seizure (HCC)   Prolonged QT interval   Diarrhea   Oxygen desaturation   Meningitis   Severe sepsis with acute organ dysfunction (HCC)    Subjective: Pt doing much better Feeling better No pain abdomen Some diarrhea  Medications:   allopurinol  100 mg Oral Daily   apixaban  5 mg Oral BID   atorvastatin  40 mg Oral Daily   Chlorhexidine Gluconate Cloth  6 each Topical Daily   cyanocobalamin  1,000 mcg Oral Daily   folic acid  1 mg Oral Daily   gabapentin  700 mg Oral QHS   lacosamide  100 mg Oral BID   levETIRAcetam  1,500 mg Oral BID   LORazepam  0-4 mg Intravenous Q6H   Followed by   Melene Muller ON 08/24/2022] LORazepam  0-4 mg Intravenous Q12H   magnesium oxide  400 mg Oral BID   metoprolol succinate  100 mg Oral Daily   multivitamin with minerals  1 tablet Oral Daily   sodium bicarbonate  650 mg Oral BID   thiamine  100 mg Oral Daily   Or   thiamine  100 mg Intravenous Daily   Vitamin D (Ergocalciferol)  50,000 Units Oral Daily    Objective: Vital signs in last 24 hours: Patient Vitals for the past 24 hrs:  BP Temp Temp src Pulse Resp SpO2  08/23/22 1200 131/76 -- -- 73 (!) 27 92 %  08/23/22 1100 117/83 -- -- 73 20 91 %  08/23/22 1000 (!) 143/89 -- -- 71 18 96 %  08/23/22 0900 136/87 -- -- 69 18 94 %  08/23/22 0837 -- -- -- 67 -- --  08/23/22 0800 (!) 139/92 98.1 F (36.7 C) Oral 62 19 94 %  08/23/22 0600 (!) 145/89 -- -- 60 (!) 24 95 %  08/23/22 0500 119/81 98.2 F (36.8 C) Oral (!) 53 11  93 %  08/23/22 0400 123/79 -- -- (!) 57 13 91 %  08/23/22 0300 (!) 148/97 -- -- (!) 57 15 99 %  08/23/22 0200 125/87 -- -- (!) 59 17 91 %  08/23/22 0100 116/83 -- -- 60 16 92 %  08/23/22 0004 134/89 98.6 F (37 C) Oral 66 (!) 26 94 %  08/22/22 2334 -- -- -- 71 (!) 23 97 %  08/22/22 2330 -- -- -- 73 (!) 29 96 %  08/22/22 2308 -- -- -- 64 (!) 24 94 %  08/22/22 2300 115/86 -- -- 64 (!) 23 94 %  08/22/22 2245 -- -- -- 65 (!) 26 91 %  08/22/22 2226 -- -- -- 66 (!) 21 93 %  08/22/22 2201 -- 98.7 F (37.1 C) Axillary -- -- --  08/22/22 2200 131/82 -- -- 71 (!) 22 92 %  08/22/22 2030 111/78 -- -- 76 (!) 23 93 %  08/22/22 2020 -- 98 F (36.7 C) Oral -- -- --  08/22/22 1800 107/76 -- -- 84 (!) 32 90 %  08/22/22 1730 114/77 -- --  86 19 96 %  08/22/22 1700 100/78 -- -- 84 (!) 37 97 %  08/22/22 1652 -- 98.2 F (36.8 C) Oral -- -- --  08/22/22 1633 115/67 -- -- 83 -- --  08/22/22 1630 115/67 -- -- 80 (!) 32 98 %  08/22/22 1600 106/75 -- -- 80 (!) 33 99 %       PHYSICAL EXAM:  General: Alert, cooperative, no distress, appears stated age.  Head: Normocephalic, without obvious abnormality, atraumatic. Eyes: Conjunctivae clear, anicteric sclerae. Pupils are equal ENT Nares normal. No drainage or sinus tenderness. Lips, mucosa, and tongue normal. No Thrush Neck: Supple, symmetrical, no adenopathy, thyroid: non tender no carotid bruit and no JVD. Back: No CVA tenderness. Lungs: Clear to auscultation bilaterally. No Wheezing or Rhonchi. No rales. Heart: Regular rate and rhythm, no murmur, rub or gallop. Abdomen: Soft, non-tender,not distended. Bowel sounds normal. No masses Extremities: atraumatic, no cyanosis. No edema. No clubbing Skin: No rashes or lesions. Or bruising Lymph: Cervical, supraclavicular normal. Neurologic: Grossly non-focal  Lab Results    Latest Ref Rng & Units 08/23/2022   12:23 AM 08/22/2022   10:27 AM 10/25/2021    6:12 AM  CBC  WBC 4.0 - 10.5 K/uL 14.6  2.7  5.3    Hemoglobin 13.0 - 17.0 g/dL 82.9  56.2  13.0   Hematocrit 39.0 - 52.0 % 44.3  48.5  36.6   Platelets 150 - 400 K/uL 106  128  308        Latest Ref Rng & Units 08/23/2022   12:23 AM 08/22/2022   10:27 AM 10/24/2021    4:53 AM  CMP  Glucose 70 - 99 mg/dL 865  784  97   BUN 6 - 20 mg/dL 22  23  15    Creatinine 0.61 - 1.24 mg/dL 6.96  2.95  2.84   Sodium 135 - 145 mmol/L 135  139  134   Potassium 3.5 - 5.1 mmol/L 4.5  4.2  4.5   Chloride 98 - 111 mmol/L 106  104  106   CO2 22 - 32 mmol/L 18  24  20    Calcium 8.9 - 10.3 mg/dL 8.6  9.4  9.3   Total Protein 6.5 - 8.1 g/dL  7.8    Total Bilirubin 0.3 - 1.2 mg/dL  1.5    Alkaline Phos 38 - 126 U/L  127    AST 15 - 41 U/L  42    ALT 0 - 44 U/L  38        Microbiology:  Studies/Results: CT HEAD WO CONTRAST ( )  Result Date: 08/23/2022 CLINICAL DATA:  Altered mental status. EXAM: CT HEAD WITHOUT CONTRAST TECHNIQUE: Contiguous axial images were obtained from the base of the skull through the vertex without intravenous contrast. RADIATION DOSE REDUCTION: This exam was performed according to the departmental dose-optimization program which includes automated exposure control, adjustment of the mA and/or kV according to patient size and/or use of iterative reconstruction technique. COMPARISON:  August 22, 2022 and October 15, 2021 FINDINGS: Brain: No evidence of acute infarction, hemorrhage, hydrocephalus, extra-axial collection or effect. A chronic right temporoparietal infarct is seen with associated ex vacuo dilatation of the posterior aspect of the right lateral ventricle. A stable 14 mm x 10 mm x 12 mm well-defined area of heterogeneous attenuation is seen within the posterior aspect of the right lateral ventricle (axial CT image 18, CT series 3/coronal reformatted image 47, CT series 5). Vascular: No hyperdense vessel or unexpected calcification.  Skull: Normal. Negative for fracture or focal lesion. Sinuses/Orbits: A small, stable posterior right  maxillary sinus polyp versus mucous retention cyst is seen. Other: None. IMPRESSION: 1. No acute intracranial abnormality. 2. 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 3. Stable, benign-appearing area of heterogeneous attenuation within the posterior aspect of the right lateral ventricle. Nonemergent MRI correlation is recommended. 4. Small, stable posterior right maxillary sinus polyp versus mucous retention cyst. Electronically Signed   By: Aram Candela M.D.   On: 08/23/2022 00:04   CT Head Wo Contrast  Result Date: 08/22/2022 CLINICAL DATA:  Mental status change, unknown cause EXAM: CT HEAD WITHOUT CONTRAST TECHNIQUE: Contiguous axial images were obtained from the base of the skull through the vertex without intravenous contrast. RADIATION DOSE REDUCTION: This exam was performed according to the departmental dose-optimization program which includes automated exposure control, adjustment of the mA and/or kV according to patient size and/or use of iterative reconstruction technique. COMPARISON:  CT 10/15/2021. FINDINGS: Brain: No evidence of acute large vascular territory infarction, hemorrhage, hydrocephalus, extra-axial collection or mass lesion/mass effect. Right parietotemporal encephalomalacia. Vascular: No definite focal hyperdense vessel. Calcific atherosclerosis. Skull: No acute fracture. Sinuses/Orbits: Inferior right maxillary sinus retention cyst. Otherwise, clear sinuses. No acute findings. Other: No mastoid effusions. IMPRESSION: 1. No evidence of acute intracranial abnormality. 2. Right parietotemporal encephalomalacia. Electronically Signed   By: Feliberto Harts M.D.   On: 08/22/2022 14:49   CT CHEST ABDOMEN PELVIS W CONTRAST  Result Date: 08/22/2022 CLINICAL DATA:  Sepsis. EXAM: CT CHEST, ABDOMEN, AND PELVIS WITH CONTRAST TECHNIQUE: Multidetector CT imaging of the chest, abdomen and pelvis was  performed following the standard protocol during bolus administration of intravenous contrast. RADIATION DOSE REDUCTION: This exam was performed according to the departmental dose-optimization program which includes automated exposure control, adjustment of the mA and/or kV according to patient size and/or use of iterative reconstruction technique. CONTRAST:  OMNIPAQUE IOHEXOL 350 MG/ML SOLN COMPARISON:  September 25, 2021. FINDINGS: CT CHEST FINDINGS Cardiovascular: No significant vascular findings. Normal heart size. No pericardial effusion. Mediastinum/Nodes: No enlarged mediastinal, hilar, or axillary lymph nodes. Thyroid gland, trachea, and esophagus demonstrate no significant findings. Lungs/Pleura: Lungs are clear. No pleural effusion or pneumothorax. Musculoskeletal: No chest wall mass or suspicious bone lesions identified. CT ABDOMEN PELVIS FINDINGS Hepatobiliary: No cholelithiasis or biliary dilatation. Hepatic steatosis. Pancreas: Unremarkable. No pancreatic ductal dilatation or surrounding inflammatory changes. Spleen: Normal in size without focal abnormality. Adrenals/Urinary Tract: Adrenal glands appear normal. Bilateral renal cortical scarring is noted. Small bilateral renal cysts are noted for which no further follow-up is required. No hydronephrosis or renal obstruction is noted. Urinary bladder is unremarkable. Stomach/Bowel: Stomach is within normal limits. Appendix appears normal. No evidence of bowel wall thickening, distention, or inflammatory changes. Vascular/Lymphatic: Aortic atherosclerosis. No enlarged abdominal or pelvic lymph nodes. Reproductive: Prostate is unremarkable. Other: Small fat containing periumbilical hernia.  No ascites. Musculoskeletal: No acute or significant osseous findings. IMPRESSION: Hepatic steatosis. Bilateral renal cortical scarring. Small fat containing periumbilical hernia. No acute abnormality seen in the abdomen or pelvis. Aortic Atherosclerosis (ICD10-I70.0).  Electronically Signed   By: Lupita Raider M.D.   On: 08/22/2022 13:28   DG Chest Portable 1 View  Result Date: 08/22/2022 CLINICAL DATA:  Weakness. Lower abdominal pain, back pain and chills EXAM: PORTABLE CHEST 1 VIEW COMPARISON:  CT AP from 10/15/2021 FINDINGS: Stable cardiomediastinal contours. No pleural effusion or edema. No airspace opacities identified. The  visualized osseous structures are unremarkable. IMPRESSION: 1. No acute findings. Electronically Signed   By: Signa Kell M.D.   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

## 2022-08-23 NOTE — Assessment & Plan Note (Signed)
POA with WBC 2.7, RR 37, heart rate 94.  Lactic acid is elevated at 3.0 --> 2.5 consistent with organ dysfunction.   Infection source is E coli GI infection with bacteremia. --ID is following --Continue Rocephin --Follow cultures susceptiblities --Monitor fever curve, CBC, hemodynamics --Enteric precautions --Trend lactic acid

## 2022-08-23 NOTE — Assessment & Plan Note (Signed)
Qtc 608 on admission. Hold flecainide Avoid QT prolonging meds including Zofran Serial EKG's to monitor

## 2022-08-23 NOTE — Assessment & Plan Note (Addendum)
Heart rate 90s. --Hold flecainide due to QTc prolongation of 608 --Continue metoprolol, Eliquis --Telemetry

## 2022-08-23 NOTE — Assessment & Plan Note (Signed)
Echo on 10/14/2021 showed EF of 55 to 60%. Appears compensated. --Hold diuretics (got fluids per sepsis, risk of hypotension) --monitor volume status & resume diuretics when appropriate

## 2022-08-23 NOTE — Assessment & Plan Note (Signed)
--  Seizure precaution --As needed Ativan for seizure --Continue home medications: Keppra, Lamictal

## 2022-08-23 NOTE — Assessment & Plan Note (Signed)
6/4 - pt given Ativan overnight for CIWA score of 11.  No tremors this AM --Monitor on CIWA --PRN Ativan for withdrawal signs/sx's

## 2022-08-23 NOTE — Progress Notes (Signed)
Progress Note   Patient: Dustin Wells ZOX:[id number] DOB: 1965/09/18 DOA: 08/22/2022     1 DOS: the patient was seen and examined on 08/23/2022   Brief hospital course: HPI on admission 6/3 by Dr. Clyde Lundborg: " Dustin Wells is a 57 y.o. 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   Per his wife at the bedside, the symptoms started this morning.  Patient has chills, shaking, headache, mild neck rigidity, generalized weakness, mild confusion.  No fever.  Temperature is 97.8 80.  Patient is still oriented x 3.  Could answer all questions appropriately.  Patient reports nausea, diarrhea and lower abdominal pain.  No vomiting.  Abdominal pain is mild to moderate, aching, constant, nonradiating.  Patient does not have cough or chest pain, reports mild shortness of breath.  Denies symptoms of UTI. He also reports lower back pain."  In the ED, afebrile, RR 30's, stable BP, O2 sat was 89% on room air but improved on 2 L [place] O2.  Labs notable for WBC 2.7, initial lactate 3.0, negative Covid-19.   Imaging - head CT non-acute.  CT abd/pelvis also no acute findings.    Pt was admitted and started on empiric IV antibiotics and antiviral therapy out of concern for potential meningitis/encephalitis.  LP was performed and CSF studies not consistent CNS infection.  ID was consulted. Stool studies revealed E coli (EPEC) infection and blood cultures also growing E coli. Antibiotics were de-escalated to Rocephin.   Assessment and Plan: * Encephalitis History of HHV-6 encephalitis Ruled Out. Follow MRI Brain  Severe sepsis (HCC) POA with WBC 2.7, RR 37, heart rate 94.  Lactic acid is elevated at 3.0 --> 2.5 consistent with organ dysfunction.   Infection source is E coli GI infection with  bacteremia. --ID is following --Continue Rocephin --Follow cultures susceptiblities --Monitor fever curve, CBC, hemodynamics --Enteric precautions --Trend lactic acid  Diarrhea E.coli (EPEC) infection --Continue Rocephin --Enteric precautions --Diuretics on hold --Monitor volumes status & electrolytes  Seizure (HCC) --Seizure precaution --As needed Ativan for seizure --Continue home medications: Keppra, Lamictal  Alcohol use disorder, severe, dependence (HCC) 6/4 - pt given Ativan overnight for CIWA score of 11.  No tremors this AM --Monitor on CIWA --PRN Ativan for withdrawal signs/sx's  Atrial fibrillation, chronic (HCC) Heart rate 90s. --Hold flecainide due to QTc prolongation of 608 --Continue metoprolol, Eliquis --Telemetry  Chronic diastolic CHF (congestive heart failure) (HCC) Echo on 10/14/2021 showed EF of 55 to 60%. Appears compensated. --Hold diuretics (got fluids per sepsis, risk of hypotension) --monitor volume status & resume diuretics when appropriate  Prolonged QT interval Qtc 608 on admission. Hold flecainide Avoid QT prolonging meds including Zofran Serial EKG's to monitor  HTN (hypertension) --Hold amlodipine (risk of hypotension w severe sepsis) --IV hydralazine as needed --Continue metoprolol for rate control  HLD (hyperlipidemia) Lipitor  Oxygen desaturation On 2 L oxygen currently.  Etiology is not clear, does not seem to have CHF exacerbation.  Chest x-ray negative.  Low suspicions for PE since patient is taking Eliquis. Suspect just due to poor inspiratory effort / shallow inspirations from abdominal pain & distension. --Supportive care --O2 per protocol --As needed albuterol and Mucinex  Depression with anxiety Continue home as needed Ativan   Obesity with body mass index of 30.0-39.9 Body mass index is 37.97 kg/m. Complicates overall care and prognosis.  Recommend lifestyle modifications including physical activity and diet for  weight loss and overall long-term health.  Meningitis Ruled out by LP, CSF cell counts Follow CSF culture to final Antibiotics de-escalated per ID        Subjective: Pt seen in ICU with his wife and sister at bedside.  He reports feeling somewhat better.  Not having tremors and denies severe withdrawal including seizures, Dts, hallucinations.  Continues to have diarrhea.  Abdominal pain improved, no nausea/vomiting. Abdomen does feel distended more than normal.  NO other acute complaints.   Physical Exam: Vitals:   08/23/22 0900 08/23/22 1000 08/23/22 1100 08/23/22 1200  BP: 136/87 (!) 143/89 117/83 131/76  Pulse: 69 71 73 73  Resp: 18 18 20  (!) 27  Temp:      TempSrc:      SpO2: 94% 96% 91% 92%  Weight:      Height:       General exam: awake, alert, no acute distress, obese HEENT: atraumatic, clear conjunctiva, anicteric sclera, moist mucus membranes, hearing grossly normal  Respiratory system: CTAB but diminished bases, on 2 L [place] O2, no wheezes, normal respiratory effort. Cardiovascular system: normal S1/S2, RRR, trace edema.   Gastrointestinal system: soft, NT, distended, +bowel sounds. Central nervous system: A&O x3. no gross focal neurologic deficits, normal speech Extremities: no tremor or outstretched hands, no edema, normal tone Skin: dry, intact, normal temperature Psychiatry: normal mood, congruent affect, judgement and insight appear normal   Data Reviewed:  Notable labs ---  bicarb 18, glucose 217, Cr 1.76, Ca 8.6. Lactic acid peaked 3.9 >> 2.5 >> repeat pending WBC 14.6, platelets 106k Stool studies ++ EPEC E coli, negative C diff Blood cultures -- E coli+ MRSA negative  CSF culture - no growth -- pending   Family Communication: wife and sister at bedside on rounds  Disposition: Status is: Inpatient Remains inpatient appropriate because: severity of illness remaining on IV antibiotics   Planned Discharge Destination: Home    Time spent: 46  minutes  Author: Pennie Banter, DO 08/23/2022 1:31 PM  For on call review [email or web address].

## 2022-08-23 NOTE — Assessment & Plan Note (Signed)
Body mass index is 37.97 kg/m. Complicates overall care and prognosis.  Recommend lifestyle modifications including physical activity and diet for weight loss and overall long-term health.

## 2022-08-23 NOTE — Assessment & Plan Note (Signed)
--  Hold amlodipine (risk of hypotension w severe sepsis) --IV hydralazine as needed --Continue metoprolol for rate control

## 2022-08-23 NOTE — Assessment & Plan Note (Signed)
-   Continue home as needed Ativan 

## 2022-08-23 NOTE — Plan of Care (Signed)

## 2022-08-23 NOTE — Assessment & Plan Note (Signed)
On 2 L oxygen currently.  Etiology is not clear, does not seem to have CHF exacerbation.  Chest x-ray negative.  Low suspicions for PE since patient is taking Eliquis. Suspect just due to poor inspiratory effort / shallow inspirations from abdominal pain & distension. --Supportive care --O2 per protocol --As needed albuterol and Mucinex

## 2022-08-23 NOTE — Assessment & Plan Note (Addendum)
History of HHV-6 encephalitis Ruled Out. Follow MRI Brain

## 2022-08-23 NOTE — Hospital Course (Addendum)
HPI on admission 6/3 by Dr. Clyde Lundborg: " Dustin Wells is a 57 y.o. 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   Per his wife at the bedside, the symptoms started this morning.  Patient has chills, shaking, headache, mild neck rigidity, generalized weakness, mild confusion.  No fever.  Temperature is 97.8 80.  Patient is still oriented x 3.  Could answer all questions appropriately.  Patient reports nausea, diarrhea and lower abdominal pain.  No vomiting.  Abdominal pain is mild to moderate, aching, constant, nonradiating.  Patient does not have cough or chest pain, reports mild shortness of breath.  Denies symptoms of UTI. He also reports lower back pain."  In the ED, afebrile, RR 30's, stable BP, O2 sat was 89% on room air but improved on 2 L [place] O2.  Labs notable for WBC 2.7, initial lactate 3.0, negative Covid-19.   Imaging - head CT non-acute.  CT abd/pelvis also no acute findings.    Pt was admitted and started on empiric IV antibiotics and antiviral therapy out of concern for potential meningitis/encephalitis.  LP was performed and CSF studies not consistent CNS infection.  ID was consulted. Stool studies revealed E coli (EPEC) infection and blood cultures also growing E coli. Antibiotics were de-escalated to Rocephin.

## 2022-08-23 NOTE — Assessment & Plan Note (Signed)
Ruled out by LP, CSF cell counts Follow CSF culture to final Antibiotics de-escalated per ID

## 2022-08-23 NOTE — Assessment & Plan Note (Signed)
Lipitor 

## 2022-08-24 ENCOUNTER — Inpatient Hospital Stay: Payer: BC Managed Care – PPO

## 2022-08-24 DIAGNOSIS — A4151 Sepsis due to Escherichia coli [E. coli]: Secondary | ICD-10-CM | POA: Diagnosis not present

## 2022-08-24 DIAGNOSIS — R319 Hematuria, unspecified: Secondary | ICD-10-CM

## 2022-08-24 DIAGNOSIS — N189 Chronic kidney disease, unspecified: Secondary | ICD-10-CM | POA: Diagnosis not present

## 2022-08-24 DIAGNOSIS — R6520 Severe sepsis without septic shock: Secondary | ICD-10-CM | POA: Diagnosis not present

## 2022-08-24 DIAGNOSIS — G9340 Encephalopathy, unspecified: Secondary | ICD-10-CM

## 2022-08-24 DIAGNOSIS — N179 Acute kidney failure, unspecified: Secondary | ICD-10-CM | POA: Diagnosis not present

## 2022-08-24 LAB — CBC
HCT: 42.8 % (ref 39.0–52.0)
Hemoglobin: 14.5 g/dL (ref 13.0–17.0)
MCH: 32.3 pg (ref 26.0–34.0)
MCHC: 33.9 g/dL (ref 30.0–36.0)
MCV: 95.3 fL (ref 80.0–100.0)
Platelets: 114 10*3/uL — ABNORMAL LOW (ref 150–400)
RBC: 4.49 MIL/uL (ref 4.22–5.81)
RDW: 13.1 % (ref 11.5–15.5)
WBC: 10.7 10*3/uL — ABNORMAL HIGH (ref 4.0–10.5)
nRBC: 0 % (ref 0.0–0.2)

## 2022-08-24 LAB — COMPREHENSIVE METABOLIC PANEL
ALT: 30 U/L (ref 0–44)
AST: 33 U/L (ref 15–41)
Albumin: 3.2 g/dL — ABNORMAL LOW (ref 3.5–5.0)
Alkaline Phosphatase: 74 U/L (ref 38–126)
Anion gap: 8 (ref 5–15)
BUN: 30 mg/dL — ABNORMAL HIGH (ref 6–20)
CO2: 20 mmol/L — ABNORMAL LOW (ref 22–32)
Calcium: 8.8 mg/dL — ABNORMAL LOW (ref 8.9–10.3)
Chloride: 110 mmol/L (ref 98–111)
Creatinine, Ser: 1.31 mg/dL — ABNORMAL HIGH (ref 0.61–1.24)
GFR, Estimated: >60 mL/min (ref >60)
Glucose, Bld: 149 mg/dL — ABNORMAL HIGH (ref 70–99)
Potassium: 3.7 mmol/L (ref 3.5–5.1)
Sodium: 138 mmol/L (ref 135–145)
Total Bilirubin: 0.8 mg/dL (ref 0.3–1.2)
Total Protein: 6.5 g/dL (ref 6.5–8.1)

## 2022-08-24 LAB — CULTURE, BLOOD (ROUTINE X 2): Special Requests: ADEQUATE

## 2022-08-24 LAB — CSF CULTURE W GRAM STAIN: Culture: NO GROWTH

## 2022-08-24 LAB — PROCALCITONIN: Procalcitonin: 66.02 ng/mL

## 2022-08-24 LAB — VITAMIN D 25 HYDROXY (VIT D DEFICIENCY, FRACTURES): Vit D, 25-Hydroxy: 29.12 ng/mL — ABNORMAL LOW (ref 30–100)

## 2022-08-24 NOTE — TOC Initial Note (Signed)
Transition of Care [facility]) - Initial/Assessment Note    Patient Details  Name: Dustin Wells MRN: [id number] Date of Birth: 10-09-1965  Transition of Care [facility]) CM/SW Contact:    Allena Katz, LCSW Phone Number: 08/24/2022, 2:10 PM  Clinical Narrative:  Pt admitted from home with Acute Encephalopathy. TOC consulted for substance sure resources. Resources added to AVS. ID is following pt currently. Pt lives at home with wife and reports no DME needs or SDOH concerns. Pt is active with Dr. Sampson Goon for primary care.   Expected Discharge Plan: Home/Self Care Barriers to Discharge: Continued Medical Work up   Patient Goals and CMS Choice Patient states their goals for this hospitalization and ongoing recovery are:: return home          Expected Discharge Plan and Services                                              Prior Living Arrangements/Services     Patient language and need for interpreter reviewed:: Yes Do you feel safe going back to the place where you live?: Yes      Need for Family Participation in Patient Care: No (Comment) Care giver support system in place?: No (comment)      Activities of Daily Living Home Assistive Devices/Equipment: None ADL Screening (condition at time of admission) Patient's cognitive ability adequate to safely complete daily activities?: Yes Is the patient deaf or have difficulty hearing?: No Does the patient have difficulty seeing, even when wearing glasses/contacts?: No Does the patient have difficulty concentrating, remembering, or making decisions?: No Patient able to express need for assistance with ADLs?: Yes Does the patient have difficulty dressing or bathing?: No Independently performs ADLs?: Yes (appropriate for developmental age) Does the patient have difficulty walking or climbing stairs?: No Weakness of Legs: None Weakness of Arms/Hands: None  Permission Sought/Granted                  Emotional  Assessment       Orientation: : Oriented to Self, Oriented to Place, Oriented to  Time, Oriented to Situation Alcohol / Substance Use: Alcohol Use    Admission diagnosis:  Meningitis [G03.9] Severe sepsis (HCC) [A41.9, R65.20] Altered mental status, unspecified altered mental status type [R41.82] Sepsis without acute organ dysfunction, due to unspecified organism (HCC) [A41.9] Severe sepsis with acute organ dysfunction (HCC) [A41.9, R65.20] Patient Active Problem List   Diagnosis Date Noted   Severe sepsis with acute organ dysfunction (HCC) 08/23/2022   Bacteremia due to Escherichia coli 08/23/2022   Sepsis due to Escherichia coli without acute organ dysfunction (HCC) 08/23/2022   Prolonged QT interval 08/22/2022   Diarrhea 08/22/2022   Oxygen desaturation 08/22/2022   Meningitis 08/22/2022   Seizure (HCC) 10/20/2021   Encephalopathy acute    Acute metabolic encephalopathy 10/15/2021   Atrial fibrillation, chronic (HCC) 10/15/2021   Depression with anxiety 10/15/2021   Severe sepsis (HCC) 10/15/2021   Thrombocytopenia (HCC) 10/15/2021   HTN (hypertension) 10/15/2021   HLD (hyperlipidemia) 10/15/2021   Obesity with body mass index of 30.0-39.9 10/15/2021   Testicular cancer (HCC) 10/15/2021   Chronic diastolic CHF (congestive heart failure) (HCC) 10/15/2021   Abnormal LFTs 10/15/2021   Abdominal distention 10/15/2021   Acute respiratory failure with hypoxia (HCC) 10/15/2021   Syncope 10/13/2021   Morbid obesity (HCC) 10/13/2021   OSA (obstructive sleep apnea)  10/13/2021   AKI (acute kidney injury) (HCC) 10/13/2021   Stage 3b chronic kidney disease (CKD) (HCC) 10/13/2021   A-fib (HCC) 10/13/2021   Alcohol use disorder, severe, dependence (HCC) 07/01/2019   MDD (major depressive disorder), recurrent episode, moderate (HCC) 07/01/2019   GAD (generalized anxiety disorder) 07/01/2019   PCP:  Mick Sell, MD Pharmacy:   [facility] [address] -  [place], [place] - [address] [facility] [address] [facility] [address] [place] [address] Phone: [phone] Fax: [phone]  [facility] [address] [place] - [address] [place] [address] Phone: [phone] Fax: [phone]  [facility] [address] [place] [address] Phone: [phone] Fax: [phone]     Social Determinants of Health (SDOH) Social History: SDOH Screenings   Food Insecurity: No Food Insecurity (08/23/2022)  Housing: Low Risk  (08/23/2022)  Transportation Needs: No Transportation Needs (08/23/2022)  Utilities: Not At Risk (08/23/2022)  Depression (PHQ2-9): Medium Risk (01/08/2021)  Tobacco Use: Low Risk  (08/22/2022)   SDOH Interventions:     Readmission Risk Interventions     No data to display

## 2022-08-24 NOTE — Discharge Instructions (Signed)
                  Intensive Outpatient Programs  [facility]    [facility] [address]     [address] [place],  [place]     [place], [place] [phone]      [phone]  [facility]   [facility]  (Inpatient and outpatient)  [phone] (Suboxone and Methadone) [address]           [phone]           [facility]    [facility] - Intensive Outpatient [address]     [address] [place], [place] [address]     [place], [place]  [phone]      [phone]  [facility] (Outpatient, Inpatient, [facility]  [facility] (Groups and Residental) (insurance only) [phone]    [place], [place]          [phone]       [facility]    [facility] (for caregivers and family) [address]     [address] [place], [place]     [place], [place] [phone]      [phone]  Residential Treatment Programs  [facility]  [place](2 years) Residential: 90 days)  [facility] [address]      [address] [place], [place]     [place], [place] [phone]      [phone] or [phone]  [facility]    [facility] [address]      [address] [place], [place]     [place], [place] [phone]      [phone]  [facility]   [facility] ([facility]) [address]     [address] [place], [place] [address]     [place], [place] [phone]      [phone] Admissions: 8am-3pm M-F  [facility]: Residential Program (90 Days)              [facility]: [facility]  [place], [place]     [place],   [phone] or [phone]    (Walk in Hours over the weekend or by referral)   [facility]: [facility]:[phone] (for crisis  response 24 hours a day) 

## 2022-08-24 NOTE — Progress Notes (Addendum)
Date of Admission:  08/22/2022   T  ID: Dustin Wells is a 57 y.o. male Principal Problem:   Encephalopathy acute Active Problems:   Alcohol use disorder, severe, dependence (HCC)   Stage 3b chronic kidney disease (CKD) (HCC)   Atrial fibrillation, chronic (HCC)   Depression with anxiety   Severe sepsis (HCC)   Thrombocytopenia (HCC)   HTN (hypertension)   HLD (hyperlipidemia)   Obesity with body mass index of 30.0-39.9   Chronic diastolic CHF (congestive heart failure) (HCC)   Seizure (HCC)   Prolonged QT interval   Diarrhea   Oxygen desaturation   Meningitis   Severe sepsis with acute organ dysfunction (HCC)   Bacteremia due to Escherichia coli   Sepsis due to Escherichia coli without acute organ dysfunction (HCC)   Wife at bed side Subjective: Pt doing much better Wife said he had hematuria- patient says his underwear had bright red spots  No dysuria No pain abdomen  Medications:   allopurinol  100 mg Oral Daily   apixaban  5 mg Oral BID   atorvastatin  40 mg Oral Daily   cyanocobalamin  1,000 mcg Oral Daily   folic acid  1 mg Oral Daily   gabapentin  700 mg Oral QHS   lacosamide  100 mg Oral BID   levETIRAcetam  1,500 mg Oral BID   LORazepam  0-4 mg Intravenous Q6H   Followed by   LORazepam  0-4 mg Intravenous Q12H   magnesium oxide  400 mg Oral BID   metoprolol succinate  100 mg Oral Daily   multivitamin with minerals  1 tablet Oral Daily   sodium bicarbonate  650 mg Oral BID   thiamine  100 mg Oral Daily   Or   thiamine  100 mg Intravenous Daily   Vitamin D (Ergocalciferol)  50,000 Units Oral Daily    Objective: Vital signs in last 24 hours: Patient Vitals for the past 24 hrs:  BP Temp Temp src Pulse Resp SpO2  08/24/22 0805 (!) 149/103 97.9 F (36.6 C) Oral 74 18 95 %  08/24/22 0500 (!) 146/90 98.9 F (37.2 C) -- 65 20 93 %  08/24/22 0032 (!) 140/76 97.8 F (36.6 C) Oral 74 18 97 %  08/23/22 1956 (!) 142/88 97.7 F (36.5 C) Oral 70 18 95 %   08/23/22 1656 (!) 157/89 97.7 F (36.5 C) -- 73 -- 97 %  08/23/22 1500 -- -- -- 71 -- --  08/23/22 1300 138/84 -- -- 77 19 94 %       PHYSICAL EXAM:  General: Alert, cooperative, no distress, appears stated age.  Head: Normocephalic, without obvious abnormality, atraumatic. Eyes: Conjunctivae clear, anicteric sclerae. Pupils are equal ENT Nares normal. No drainage or sinus tenderness. Lips, mucosa, and tongue normal. No Thrush Neck: Supple, symmetrical, no adenopathy, thyroid: non tender no carotid bruit and no JVD. Back: No CVA tenderness. Lungs: Clear to auscultation bilaterally. No Wheezing or Rhonchi. No rales. Heart: Regular rate and rhythm, no murmur, rub or gallop. Abdomen: Soft, non-tender,not distended. Bowel sounds normal. No masses On examining his penis there is a streak dried blood at the urethral entry point Extremities: atraumatic, no cyanosis. No edema. No clubbing Skin: No rashes or lesions. Or bruising Lymph: Cervical, supraclavicular normal. Neurologic: Grossly non-focal  Lab Results    Latest Ref Rng & Units 08/24/2022    3:43 AM 08/23/2022   12:23 AM 08/22/2022   10:27 AM  CBC  WBC 4.0 - 10.5  K/uL 10.7  14.6  2.7   Hemoglobin 13.0 - 17.0 g/dL 19.1  47.8  29.5   Hematocrit 39.0 - 52.0 % 42.8  44.3  48.5   Platelets 150 - 400 K/uL 114  106  128        Latest Ref Rng & Units 08/24/2022    3:43 AM 08/23/2022   12:23 AM 08/22/2022   10:27 AM  CMP  Glucose 70 - 99 mg/dL 621  308  657   BUN 6 - 20 mg/dL 30  22  23    Creatinine 0.61 - 1.24 mg/dL 8.46  9.62  9.52   Sodium 135 - 145 mmol/L 138  135  139   Potassium 3.5 - 5.1 mmol/L 3.7  4.5  4.2   Chloride 98 - 111 mmol/L 110  106  104   CO2 22 - 32 mmol/L 20  18  24    Calcium 8.9 - 10.3 mg/dL 8.8  8.6  9.4   Total Protein 6.5 - 8.1 g/dL 6.5   7.8   Total Bilirubin 0.3 - 1.2 mg/dL 0.8   1.5   Alkaline Phos 38 - 126 U/L 74   127   AST 15 - 41 U/L 33   42   ALT 0 - 44 U/L 30   38        Microbiology:  Studies/Results: MR BRAIN WO CONTRAST  Result Date: 08/23/2022 CLINICAL DATA:  Chills, shaking, headache, neck rigidity, generalized weakness, confusion. EXAM: MRI HEAD WITHOUT CONTRAST TECHNIQUE: Multiplanar, multiecho pulse sequences of the brain and surrounding structures were obtained without intravenous contrast. COMPARISON:  CT head 1 day prior FINDINGS: Brain: There is no acute intracranial hemorrhage, extra-axial fluid collection, or acute infarct. Background parenchymal volume is normal. There is unchanged encephalomalacia in the right temporal and parietal lobes with associated ex vacuo dilatation of the right lateral ventricle. There are a few additional small foci of FLAIR signal abnormality in the supratentorial white matter which are nonspecific but likely reflect sequela of mild chronic small-vessel ischemic change. 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 The pituitary and suprasellar region are normal There is no solid mass lesion. There is no mass effect or midline shift. Vascular: Normal flow voids. Skull and upper cervical spine: Normal marrow signal. Sinuses/Orbits: The paranasal sinuses are clear. The globes and orbits are unremarkable. Other: None. IMPRESSION: 1. No acute intracranial pathology. 2. 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 3. 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 Electronically Signed   By: Lesia Hausen M.D.   On: 08/23/2022 16:48   CT HEAD WO CONTRAST ( )  Result Date: 08/23/2022 CLINICAL DATA:  Altered mental status. EXAM: CT HEAD WITHOUT CONTRAST TECHNIQUE: Contiguous axial images were obtained from the base of the skull through the vertex without intravenous contrast. RADIATION DOSE REDUCTION: This exam  was performed according to the departmental dose-optimization program which includes automated exposure control, adjustment of the mA and/or kV according to patient size and/or use of iterative reconstruction technique. COMPARISON:  August 22, 2022 and October 15, 2021 FINDINGS: Brain: No evidence of acute infarction, hemorrhage, hydrocephalus, extra-axial collection or effect. A chronic right temporoparietal infarct is seen with associated ex vacuo dilatation of the posterior aspect of the right lateral ventricle. A stable 14 mm x 10  mm x 12 mm well-defined area of heterogeneous attenuation is seen within the posterior aspect of the right lateral ventricle (axial CT image 18, CT series 3/coronal reformatted image 47, CT series 5). Vascular: No hyperdense vessel or unexpected calcification. Skull: Normal. Negative for fracture or focal lesion. Sinuses/Orbits: A small, stable posterior right maxillary sinus polyp versus mucous retention cyst is seen. Other: None. IMPRESSION: 1. No acute intracranial abnormality. 2. 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 3. Stable, benign-appearing area of heterogeneous attenuation within the posterior aspect of the right lateral ventricle. Nonemergent MRI correlation is recommended. 4. Small, stable posterior right maxillary sinus polyp versus mucous retention cyst. Electronically Signed   By: Aram Candela M.D.   On: 08/23/2022 00:04   CT Head Wo Contrast  Result Date: 08/22/2022 CLINICAL DATA:  Mental status change, unknown cause EXAM: CT HEAD WITHOUT CONTRAST TECHNIQUE: Contiguous axial images were obtained from the base of the skull through the vertex without intravenous contrast. RADIATION DOSE REDUCTION: This exam was performed according to the departmental dose-optimization program which includes automated exposure control, adjustment of the mA and/or kV according to patient size and/or  use of iterative reconstruction technique. COMPARISON:  CT 10/15/2021. FINDINGS: Brain: No evidence of acute large vascular territory infarction, hemorrhage, hydrocephalus, extra-axial collection or mass lesion/mass effect. Right parietotemporal encephalomalacia. Vascular: No definite focal hyperdense vessel. Calcific atherosclerosis. Skull: No acute fracture. Sinuses/Orbits: Inferior right maxillary sinus retention cyst. Otherwise, clear sinuses. No acute findings. Other: No mastoid effusions. IMPRESSION: 1. No evidence of acute intracranial abnormality. 2. Right parietotemporal encephalomalacia. Electronically Signed   By: Feliberto Harts M.D.   On: 08/22/2022 14:49   CT CHEST ABDOMEN PELVIS W CONTRAST  Result Date: 08/22/2022 CLINICAL DATA:  Sepsis. EXAM: CT CHEST, ABDOMEN, AND PELVIS WITH CONTRAST TECHNIQUE: Multidetector CT imaging of the chest, abdomen and pelvis was performed following the standard protocol during bolus administration of intravenous contrast. RADIATION DOSE REDUCTION: This exam was performed according to the departmental dose-optimization program which includes automated exposure control, adjustment of the mA and/or kV according to patient size and/or use of iterative reconstruction technique. CONTRAST:  OMNIPAQUE IOHEXOL 350 MG/ML SOLN COMPARISON:  September 25, 2021. FINDINGS: CT CHEST FINDINGS Cardiovascular: No significant vascular findings. Normal heart size. No pericardial effusion. Mediastinum/Nodes: No enlarged mediastinal, hilar, or axillary lymph nodes. Thyroid gland, trachea, and esophagus demonstrate no significant findings. Lungs/Pleura: Lungs are clear. No pleural effusion or pneumothorax. Musculoskeletal: No chest wall mass or suspicious bone lesions identified. CT ABDOMEN PELVIS FINDINGS Hepatobiliary: No cholelithiasis or biliary dilatation. Hepatic steatosis. Pancreas: Unremarkable. No pancreatic ductal dilatation or surrounding inflammatory changes. Spleen: Normal in  size without focal abnormality. Adrenals/Urinary Tract: Adrenal glands appear normal. Bilateral renal cortical scarring is noted. Small bilateral renal cysts are noted for which no further follow-up is required. No hydronephrosis or renal obstruction is noted. Urinary bladder is unremarkable. Stomach/Bowel: Stomach is within normal limits. Appendix appears normal. No evidence of bowel wall thickening, distention, or inflammatory changes. Vascular/Lymphatic: Aortic atherosclerosis. No enlarged abdominal or pelvic lymph nodes. Reproductive: Prostate is unremarkable. Other: Small fat containing periumbilical hernia.  No ascites. Musculoskeletal: No acute or significant osseous findings. IMPRESSION: Hepatic steatosis. Bilateral renal cortical scarring. Small fat containing periumbilical hernia. No acute abnormality seen in the abdomen or pelvis. Aortic Atherosclerosis (ICD10-I70.0). Electronically Signed   By: Lupita Raider M.D.   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

## 2022-08-24 NOTE — Progress Notes (Signed)
[facility] Progress Note  Patient: Dustin Wells    ION:[id number]  DOA: 08/22/2022     Date of Service: the patient was seen and examined on 08/24/2022  Chief Complaint  Patient presents with   Abdominal Pain   Brief hospital course: HPI on admission 6/3 by Dr. Clyde Lundborg: " Dustin Wells is a 57 y.o. 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   Per his wife at the bedside, the symptoms started this morning.  Patient has chills, shaking, headache, mild neck rigidity, generalized weakness, mild confusion.  No fever.  Temperature is 97.8 80.  Patient is still oriented x 3.  Could answer all questions appropriately.  Patient reports nausea, diarrhea and lower abdominal pain.  No vomiting.  Abdominal pain is mild to moderate, aching, constant, nonradiating.  Patient does not have cough or chest pain, reports mild shortness of breath.  Denies symptoms of UTI. He also reports lower back pain."   In the ED, afebrile, RR 30's, stable BP, O2 sat was 89% on room air but improved on 2 L [place] O2.  Labs notable for WBC 2.7, initial lactate 3.0, negative Covid-19.   Imaging - head CT non-acute.  CT abd/pelvis also no acute findings.     Pt was admitted and started on empiric IV antibiotics and antiviral therapy out of concern for potential meningitis/encephalitis.  LP was performed and CSF studies not consistent CNS infection.   ID was consulted. Stool studies revealed E coli (EPEC) infection and blood cultures also growing E coli. Antibiotics were de-escalated to Rocephin.   Assessment and Plan:   # Encephalitis, Ruled Out History of HHV-6 encephalitis MRI Brain: 1. No acute intracranial pathology. 2. 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 3. 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    Severe sepsis due to E. coli bacteremia POA with WBC 2.7, RR 37, heart rate 94.  Lactic acid is elevated at 3.0 --> 2.5 consistent with organ dysfunction.   Infection source is E coli GI infection with bacteremia. Patient presented to the diarrhea, GI pathogen positive for E. coli, C. difficile negative --ID is following --Continue Rocephin --Follow cultures susceptiblities --Monitor fever curve, CBC, hemodynamics --Enteric precautions --Trend lactic acid     Seizure  --Seizure precaution --As needed Ativan for seizure --Continue home medications: Keppra, Lamictal   Alcohol use disorder, severe, dependence (HCC) 6/4 - pt given Ativan overnight for CIWA score of 11.  No tremors this AM --Monitor on CIWA --PRN Ativan for withdrawal signs/sx's   Atrial fibrillation, chronic (HCC) Heart rate 90s. --Hold flecainide due to QTc prolongation of 608 --Continue metoprolol, Eliquis --Telemetry   Chronic diastolic CHF (congestive heart failure) (HCC) Echo on 10/14/2021 showed EF of 55 to 60%. Appears compensated. --Hold diuretics (got fluids per sepsis, risk of hypotension) --monitor volume status & resume diuretics when appropriate   Prolonged QT interval Qtc 608 on admission. Hold flecainide Avoid QT prolonging meds including Zofran Serial EKG's to monitor Repeat EKG tomorrow a.m. on 6/6    HTN (hypertension) --Hold amlodipine (risk of hypotension w severe sepsis) --IV hydralazine as needed --Continue metoprolol for rate control   HLD (hyperlipidemia) Lipitor   Oxygen desaturation On 2 L oxygen currently.  Etiology is not clear, does not  seem to have CHF exacerbation.  Chest x-ray negative.  Low suspicions for PE since patient is taking Eliquis. Suspect just due to poor inspiratory effort / shallow inspirations from abdominal pain &  distension. --Supportive care --O2 per protocol --As needed albuterol and Mucinex   Depression with anxiety Continue home as needed Ativan    Obesity with body mass index of 30.0-39.9 Body mass index is 37.97 kg/m. Complicates overall care and prognosis.  Recommend lifestyle modifications including physical activity and diet for weight loss and overall long-term health.   Meningitis Ruled out by LP, CSF cell counts CSF culture NGTD Antibiotics de-escalated per ID   Body mass index is 37.97 kg/m.  Interventions:       Diet: Heart healthy diet DVT Prophylaxis: Therapeutic Anticoagulation with Eliquis    Advance goals of care discussion: Full code  Family Communication: family was present at bedside, at the time of interview.  The pt provided permission to discuss medical plan with the family. Opportunity was given to ask question and all questions were answered satisfactorily.   Disposition:  Pt is from Home, admitted with sepsis, E. coli bacteremia and diarrhea, still on IV antibiotics, pending repeat blood cultures and ID clearance, which precludes a safe discharge. Discharge to home, when clinically stable, may need 1-2 more days to stay in the hospital.  Subjective: No significant events overnight, no diarrhea today, denies any abdominal pain.  Yesterday patient had 6 episodes of diarrhea.  Denies any chest pain or palpitation, no shortness of breath, no any active issues.  Physical Exam: General: NAD, lying comfortably Appear in no distress, affect appropriate Eyes: PERRLA ENT: Oral Mucosa Clear, moist  Neck: no JVD,  Cardiovascular: S1 and S2 Present, no Murmur,  Respiratory: good respiratory effort, Bilateral Air entry equal and Decreased, no Crackles, no wheezes Abdomen: Bowel Sound present, Soft and no tenderness,  Skin: no rashes Extremities: no Pedal edema, no calf tenderness Neurologic: without any new focal findings Gait not checked due to patient safety  concerns  Vitals:   08/23/22 1956 08/24/22 0032 08/24/22 0500 08/24/22 0805  BP: (!) 142/88 (!) 140/76 (!) 146/90 (!) 149/103  Pulse: 70 74 65 74  Resp: 18 18 20 18   Temp: 97.7 F (36.5 C) 97.8 F (36.6 C) 98.9 F (37.2 C) 97.9 F (36.6 C)  TempSrc: Oral Oral  Oral  SpO2: 95% 97% 93% 95%  Weight:      Height:        Intake/Output Summary (Last 24 hours) at 08/24/2022 1358 Last data filed at 08/23/2022 1800 Gross per 24 hour  Intake 148.69 ml  Output 550 ml  Net -401.31 ml   Filed Weights   08/22/22 1028  Weight: 127 kg    Data Reviewed: I have personally reviewed and interpreted daily labs, tele strips, imagings as discussed above. I reviewed all nursing notes, pharmacy notes, vitals, pertinent old records I have discussed plan of care as described above with RN and patient/family.  CBC: Recent Labs  Lab 08/22/22 1027 08/23/22 0023 08/24/22 0343  WBC 2.7* 14.6* 10.7*  NEUTROABS 2.3  --   --   HGB 16.1 14.7 14.5  HCT 48.5 44.3 42.8  MCV 98.0 97.6 95.3  PLT 128* 106* 114*   Basic Metabolic Panel: Recent Labs  Lab 08/22/22 1027 08/23/22 0023 08/23/22 0500 08/24/22 0343  NA 139 135  --  138  K 4.2 4.5  --  3.7  CL 104 106  --  110  CO2  24 18*  --  20*  GLUCOSE 104* 217*  --  149*  BUN 23* 22*  --  30*  CREATININE 1.65* 1.76*  --  1.31*  CALCIUM 9.4 8.6*  --  8.8*  MG 1.6*  --  1.8  --   PHOS 2.2*  --  2.6  --     Studies: MR BRAIN WO CONTRAST  Result Date: 08/23/2022 CLINICAL DATA:  Chills, shaking, headache, neck rigidity, generalized weakness, confusion. EXAM: MRI HEAD WITHOUT CONTRAST TECHNIQUE: Multiplanar, multiecho pulse sequences of the brain and surrounding structures were obtained without intravenous contrast. COMPARISON:  CT head 1 day prior FINDINGS: Brain: There is no acute intracranial hemorrhage, extra-axial fluid collection, or acute infarct. Background parenchymal volume is normal. There is unchanged encephalomalacia in the right temporal  and parietal lobes with associated ex vacuo dilatation of the right lateral ventricle. There are a few additional small foci of FLAIR signal abnormality in the supratentorial white matter which are nonspecific but likely reflect sequela of mild chronic small-vessel ischemic change. 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 The pituitary and suprasellar region are normal There is no solid mass lesion. There is no mass effect or midline shift. Vascular: Normal flow voids. Skull and upper cervical spine: Normal marrow signal. Sinuses/Orbits: The paranasal sinuses are clear. The globes and orbits are unremarkable. Other: None. IMPRESSION: 1. No acute intracranial pathology. 2. 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 3. 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 Electronically Signed   By: Lesia Hausen M.D.   On: 08/23/2022 16:48    Scheduled Meds:  allopurinol  100 mg Oral Daily   apixaban  5 mg Oral BID   atorvastatin  40 mg Oral Daily   cyanocobalamin  1,000 mcg Oral Daily   folic acid  1 mg Oral Daily   gabapentin  700 mg Oral QHS   lacosamide  100 mg Oral BID   levETIRAcetam  1,500 mg Oral BID   LORazepam  0-4 mg Intravenous Q6H   Followed by   LORazepam  0-4 mg Intravenous Q12H   magnesium oxide  400 mg Oral BID   metoprolol succinate  100 mg Oral Daily   multivitamin with minerals  1 tablet Oral Daily   sodium bicarbonate  650 mg Oral BID   thiamine  100 mg Oral Daily   Or   thiamine  100 mg Intravenous Daily   Vitamin D (Ergocalciferol)  50,000 Units Oral Daily   Continuous Infusions:  sodium chloride 100 mL/hr at 08/23/22 1742   cefTRIAXone (ROCEPHIN)  IV 2 g (08/23/22 2143)   PRN Meds: acetaminophen, albuterol, dextromethorphan-guaiFENesin,  diphenhydrAMINE, hydrALAZINE, LORazepam **OR** LORazepam, LORazepam, LORazepam, mouth rinse, pantoprazole  Time spent: 35 minutes  Author: Gillis Santa. MD [facility] 08/24/2022 1:58 PM  To reach On-call, see care teams to locate the attending and reach out to them via [email or web address]. If 7PM-7AM, please contact night-coverage If you still have difficulty reaching the attending provider, please page the [facility] (Director on Call) for [facility] on amion for assistance.

## 2022-08-25 DIAGNOSIS — R001 Bradycardia, unspecified: Secondary | ICD-10-CM

## 2022-08-25 DIAGNOSIS — G9340 Encephalopathy, unspecified: Secondary | ICD-10-CM | POA: Diagnosis not present

## 2022-08-25 LAB — CULTURE, BLOOD (ROUTINE X 2)

## 2022-08-25 LAB — BASIC METABOLIC PANEL
Anion gap: 8 (ref 5–15)
BUN: 25 mg/dL — ABNORMAL HIGH (ref 6–20)
CO2: 21 mmol/L — ABNORMAL LOW (ref 22–32)
Calcium: 9 mg/dL (ref 8.9–10.3)
Chloride: 110 mmol/L (ref 98–111)
Creatinine, Ser: 1.18 mg/dL (ref 0.61–1.24)
GFR, Estimated: >60 mL/min (ref >60)
Glucose, Bld: 102 mg/dL — ABNORMAL HIGH (ref 70–99)
Potassium: 3.8 mmol/L (ref 3.5–5.1)
Sodium: 139 mmol/L (ref 135–145)

## 2022-08-25 LAB — MAGNESIUM: Magnesium: 1.7 mg/dL (ref 1.7–2.4)

## 2022-08-25 LAB — URINE CULTURE: Culture: NO GROWTH

## 2022-08-25 LAB — CBC
HCT: 42.7 % (ref 39.0–52.0)
Hemoglobin: 14.3 g/dL (ref 13.0–17.0)
MCH: 32.1 pg (ref 26.0–34.0)
MCHC: 33.5 g/dL (ref 30.0–36.0)
MCV: 95.7 fL (ref 80.0–100.0)
Platelets: 118 10*3/uL — ABNORMAL LOW (ref 150–400)
RBC: 4.46 MIL/uL (ref 4.22–5.81)
RDW: 13.2 % (ref 11.5–15.5)
WBC: 5.9 10*3/uL (ref 4.0–10.5)
nRBC: 0 % (ref 0.0–0.2)

## 2022-08-25 LAB — CSF CULTURE W GRAM STAIN

## 2022-08-25 LAB — PHOSPHORUS: Phosphorus: 3.5 mg/dL (ref 2.5–4.6)

## 2022-08-25 MED ORDER — SACCHAROMYCES BOULARDII 250 MG PO CAPS
250.0000 mg | ORAL_CAPSULE | Freq: Two times a day (BID) | ORAL | Status: DC
  Filled 2022-08-25: qty 1

## 2022-08-25 MED ORDER — SACCHAROMYCES BOULARDII 250 MG PO CAPS: 250.0000 mg | ORAL_CAPSULE | Freq: Two times a day (BID) | ORAL | 0 refills | Status: AC

## 2022-08-25 MED ORDER — AMOXICILLIN 500 MG PO CAPS: 1000.0000 mg | ORAL_CAPSULE | Freq: Three times a day (TID) | ORAL | 0 refills | Status: AC

## 2022-08-25 NOTE — Plan of Care (Signed)
Patient is adequate for discharge to home environment with family support via private vehicle in stable condition.

## 2022-08-25 NOTE — Progress Notes (Signed)
Patient brady between 38-50 but not sustained on tele. I personally check patient HR twice same between 56 and 63. Was asleep, I woke him up no complaint. When up hr is 63. Schedule EKG how brady at 63.

## 2022-08-25 NOTE — Discharge Summary (Signed)
[facility] Discharge Summary   Patient: Dustin Wells VWU:[id number]  PCP: Mick Sell, MD  Date of admission: 08/22/2022   Date of discharge:  08/25/2022     Discharge Diagnoses:  Principal Problem:   Encephalopathy acute Active Problems:   Severe sepsis (HCC)   Seizure (HCC)   Diarrhea   Alcohol use disorder, severe, dependence (HCC)   Atrial fibrillation, chronic (HCC)   Chronic diastolic CHF (congestive heart failure) (HCC)   Stage 3b chronic kidney disease (CKD) (HCC)   HTN (hypertension)   Prolonged QT interval   HLD (hyperlipidemia)   Thrombocytopenia (HCC)   Depression with anxiety   Oxygen desaturation   Obesity with body mass index of 30.0-39.9   Meningitis   Severe sepsis with acute organ dysfunction (HCC)   Bacteremia due to Escherichia coli   Sepsis due to Escherichia coli without acute organ dysfunction (HCC)   Hematuria   Admitted From: Home Disposition:  Home   Recommendations for Outpatient Follow-up:  Follow-up with PCP in 1 week Follow-up with cardiologist as per schedule Follow up LABS/TEST:     Diet recommendation: Cardiac diet  Activity: The patient is advised to gradually reintroduce usual activities, as tolerated  Discharge Condition: stable  Code Status: Full code   History of present illness: As per the H and P dictated on admission Hospital Course:  HPI on admission 6/3 by Dr. Clyde Lundborg: " Dustin Wells is a 57 y.o. 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 Per his wife at the bedside, the symptoms started this morning.  Patient has chills, shaking, headache, mild neck rigidity, generalized weakness, mild confusion.  No fever.  Temperature is 97.8 80.  Patient is still oriented x 3.  Could answer all  questions appropriately.  Patient reports nausea, diarrhea and lower abdominal pain.  No vomiting.  Abdominal pain is mild to moderate, aching, constant, nonradiating.  Patient does not have cough or chest pain, reports mild shortness of breath.  Denies symptoms of UTI. He also reports lower back pain." ED w/up:, afebrile, RR 30's, stable BP, O2 sat was 89% on room air but improved on 2 L [place] O2.  Labs notable for WBC 2.7, initial lactate 3.0, negative Covid-19.   Imaging - head CT non-acute.  CT abd/pelvis also no acute findings.   Pt was admitted and started on empiric IV antibiotics and antiviral therapy out of concern for potential meningitis/encephalitis.  LP was performed and CSF studies not consistent CNS infection. ID was consulted. Stool studies revealed E coli (EPEC) infection and blood cultures also growing E coli. Antibiotics were de-escalated to Rocephin.  Assessment and Plan: # Severe sepsis due to E. coli bacteremia POA with WBC 2.7, RR 37, heart rate 94.  Lactic acid is elevated at 3.0 --> 2.5 consistent with organ dysfunction. E coli GI infection with bacteremia. Patient presented to the diarrhea, GI pathogen positive for E. coli, C. difficile negative.  ID was consulted, s/p Rocephin during hospital stay.  Discharged on amoxicillin 1 g p.o. 3 times daily for 8 days.  Prescribed probiotics twice daily for 5 days. # Meningitis and encephalitis, Ruled Out Ruled out by LP, CSF cell counts. CSF culture NGTD. Antibiotics de-escalated per ID History of HHV-6 encephalitis MRI Brain: 1. No acute intracranial pathology. 2. 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 3. 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 # Seizure, Continue home medications: Keppra, Lamictal # Alcohol use disorder, severe, dependence. On 6/4 - pt was given Ativan overnight for CIWA score of 11.  No tremors this AM.  No  more withdrawal symptoms.  Alcohol abstinence counseling done. # Atrial fibrillation, chronic: Heart rate 90s., Held flecainide due to QTc prolongation of 608 and LBBB. Continued metoprolol, and Eliquis.  Patient was monitored on telemetry, no significant events noticed.  EKG repeated, QTc interval improved, LBBB resolved.  Discussed with cardiologist, recommended to resume flecainide on discharge.  Patient was recommended to follow with cardiology as an outpatient.  # Chronic diastolic CHF (congestive heart failure) Echo on 10/14/2021 showed EF of 55 to 60%. Appears compensated.  Patient got fluids per sepsis, due to risk of hypotension.  # HTN (hypertension), held amlodipine during hospital stay due to risk of hypotension.  Blood pressure continued.  BP remained stable, resumed home medications on discharge.    (hyperlipidemia): Lipitor #Acute hypoxic respiratory failure: On 2 L oxygen currently.  Etiology is not clear, does not seem to have CHF exacerbation.  Chest x-ray negative.  Low suspicions for PE since patient is taking Eliquis. Suspect just due to poor inspiratory effort / shallow inspirations from abdominal pain & distension.  Oxygen was gradually weaned off, currently patient is saturating well on room air. #Depression with anxiety: Continue home as needed Ativan  #Obesity with body mass index of 30.0-39.9 Body mass index is 37.97 kg/m. Complicates overall care and prognosis.  Recommend lifestyle modifications including physical activity and diet for weight loss and overall long-term health.  - Patient was instructed, not to drive, operate heavy machinery, perform activities at heights, swimming or participation in water activities or provide baby sitting services while on Pain, Sleep and Anxiety Medications; until his outpatient Physician has advised to do so again.  - Also recommended to not to take more than prescribed Pain, Sleep and Anxiety Medications.  Patient was ambulatory without  any assistance. On the day of the discharge the patient's vitals were stable, and no other acute medical condition were reported by patient. the patient was felt safe to be discharge at Home.  Consultants: ID Procedures: None  Discharge Exam: General: Appear in no distress, no Rash; Oral Mucosa Clear, moist. Cardiovascular: S1 and S2 Present, no Murmur, Respiratory: normal respiratory effort, Bilateral Air entry present and no Crackles, no wheezes Abdomen: Bowel Sound present, Soft and no tenderness, no hernia Extremities: no Pedal edema, no calf tenderness Neurology: alert and oriented to time, place, and person affect appropriate.  Filed Weights   08/22/22 1028  Weight: 127 kg   Vitals:   08/25/22 0846 08/25/22 1224  BP: (!) 142/75 (!) 146/96  Pulse: (!) 55 64  Resp: 16 16  Temp: 98.2 F (36.8 C) 98.3 F (36.8 C)  SpO2: 96% 96%    DISCHARGE MEDICATION: Allergies as of 08/25/2022       Reactions   Rivaroxaban Diarrhea, Nausea Only, Other (See Comments)   Nausea, diarrhea, abdominal pain Nausea, diarrhea, abdominal pain Nausea, diarrhea, abdominal pain   Sulfamethoxazole-trimethoprim Rash        Medication List     STOP taking these medications    clomiPHENE 50 MG tablet Commonly known as: CLOMID   cyanocobalamin 1000 MCG tablet   phenobarbital 16.2 MG tablet Commonly known as: LUMINAL   sildenafil 20 MG tablet Commonly known as:  REVATIO   valGANciclovir 450 MG tablet Commonly known as: VALCYTE       TAKE these medications    acetaminophen 325 MG tablet Commonly known as: TYLENOL Take 650 mg by mouth every 6 (six) hours as needed for mild pain.   allopurinol 300 MG tablet Commonly known as: ZYLOPRIM Take 100 mg by mouth daily.   amLODipine 5 MG tablet Commonly known as: NORVASC Take 5 mg by mouth daily.   amoxicillin 500 MG capsule Commonly known as: AMOXIL Take 2 capsules (1,000 mg total) by mouth 3 (three) times daily for 8 days.    atorvastatin 40 MG tablet Commonly known as: LIPITOR Take 40 mg by mouth daily.   Cholecalciferol 1.25 MG (50000 UT) Tabs Take 50,000 Units by mouth daily.   Eliquis 5 MG Tabs tablet Generic drug: apixaban Take 5 mg by mouth 2 (two) times daily.   flecainide 100 MG tablet Commonly known as: TAMBOCOR Take 100 mg by mouth every 12 (twelve) hours.   folic acid 1 MG tablet Commonly known as: FOLVITE Take 1 mg by mouth daily.   gabapentin 300 MG capsule Commonly known as: NEURONTIN Take 700 mg by mouth at bedtime. Has a rx for gabapentin 300 mg and 100 mg, for a total of 700 mg at bedtime   ketoconazole 2 % shampoo Commonly known as: NIZORAL Apply 1 Application topically daily as needed for irritation.   Lacosamide 100 MG Tabs Take 1 tablet (100 mg total) by mouth 2 (two) times daily.   levETIRAcetam 750 MG tablet Commonly known as: KEPPRA Take 2 tablets (1,500 mg total) by mouth 2 (two) times daily.   LORazepam 2 MG tablet Commonly known as: ATIVAN Take 2 mg by mouth at bedtime as needed for sleep, sedation, seizure or anxiety.   magnesium oxide 400 (240 Mg) MG tablet Commonly known as: MAG-OX Take 1 tablet by mouth 2 (two) times daily.   metoprolol succinate 100 MG 24 hr tablet Commonly known as: TOPROL-XL Take 1 tablet (100 mg total) by mouth daily. hold What changed:  when to take this additional instructions   pantoprazole 40 MG tablet Commonly known as: PROTONIX Take 40 mg by mouth daily as needed (acid reflux).   saccharomyces boulardii 250 MG capsule Commonly known as: FLORASTOR Take 1 capsule (250 mg total) by mouth 2 (two) times daily for 5 days.   sodium bicarbonate 650 MG tablet Take 650 mg by mouth 2 (two) times daily.       Allergies  Allergen Reactions   Rivaroxaban Diarrhea, Nausea Only and Other (See Comments)    Nausea, diarrhea, abdominal pain Nausea, diarrhea, abdominal pain Nausea, diarrhea, abdominal pain     Sulfamethoxazole-Trimethoprim Rash   Discharge Instructions     Call MD for:   Complete by: As directed    Persistent diarrhea or abdominal pain   Call MD for:  difficulty breathing, headache or visual disturbances   Complete by: As directed    Call MD for:  extreme fatigue   Complete by: As directed    Call MD for:  persistant dizziness or light-headedness   Complete by: As directed    Call MD for:  persistant nausea and vomiting   Complete by: As directed    Call MD for:  severe uncontrolled pain   Complete by: As directed    Call MD for:  temperature >100.4   Complete by: As directed    Diet - low sodium heart healthy   Complete by:  As directed    Discharge instructions   Complete by: As directed    Follow-up with PCP in 1 week Follow-up with as per schedule   Increase activity slowly   Complete by: As directed        The results of significant diagnostics from this hospitalization (including imaging, microbiology, ancillary and laboratory) are listed below for reference.    Significant Diagnostic Studies: US RENAL  Result Date: 08/24/2022 CLINICAL DATA:  Hematuria EXAM: RENAL / URINARY TRACT ULTRASOUND COMPLETE COMPARISON:  Renal ultrasound 10/14/2021 FINDINGS: Right Kidney: Renal measurements: 11.8 x 6.4 x 5.4 cm = volume: 213.1 mL. Echogenicity within normal limits. No mass or hydronephrosis visualized. Left Kidney: Renal measurements: 11.7 x 4.6 x 4.7 cm = volume: 132.5 mL. Normal renal cortical thickness and echogenicity. No hydronephrosis. There is a 7 mm too small to characterize hypoechoic lesion inferior pole left kidney. Given the small size, no imaging follow-up is needed. Bladder: Appears normal for degree of bladder distention. Other: None. IMPRESSION: No hydronephrosis. Electronically Signed   By: Annia Belt M.D.   On: 08/24/2022 15:58   MR BRAIN WO CONTRAST  Result Date: 08/23/2022 CLINICAL DATA:  Chills, shaking, headache, neck rigidity, generalized weakness,  confusion. EXAM: MRI HEAD WITHOUT CONTRAST TECHNIQUE: Multiplanar, multiecho pulse sequences of the brain and surrounding structures were obtained without intravenous contrast. COMPARISON:  CT head 1 day prior FINDINGS: Brain: There is no acute intracranial hemorrhage, extra-axial fluid collection, or acute infarct. Background parenchymal volume is normal. There is unchanged encephalomalacia in the right temporal and parietal lobes with associated ex vacuo dilatation of the right lateral ventricle. There are a few additional small foci of FLAIR signal abnormality in the supratentorial white matter which are nonspecific but likely reflect sequela of mild chronic small-vessel ischemic change. 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 The pituitary and suprasellar region are normal There is no solid mass lesion. There is no mass effect or midline shift. Vascular: Normal flow voids. Skull and upper cervical spine: Normal marrow signal. Sinuses/Orbits: The paranasal sinuses are clear. The globes and orbits are unremarkable. Other: None. IMPRESSION: 1. No acute intracranial pathology. 2. 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 3. 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 Electronically Signed   By: Lesia Hausen M.D.   On: 08/23/2022 16:48   CT HEAD WO CONTRAST ( )  Result Date: 08/23/2022 CLINICAL DATA:  Altered mental status. EXAM: CT HEAD WITHOUT CONTRAST TECHNIQUE: Contiguous axial images were obtained from the base of the skull through the vertex without intravenous contrast. RADIATION DOSE REDUCTION: This exam was performed according to the departmental dose-optimization program which includes automated exposure control, adjustment of the mA and/or kV according to patient size  and/or use of iterative reconstruction technique. COMPARISON:  August 22, 2022 and October 15, 2021 FINDINGS: Brain: No evidence of acute infarction, hemorrhage, hydrocephalus, extra-axial collection or effect. A chronic right temporoparietal infarct is seen with associated ex vacuo dilatation of the posterior aspect of the right lateral ventricle. A stable 14 mm x 10 mm x 12 mm well-defined area of heterogeneous attenuation is seen within the posterior aspect of the right lateral ventricle (axial CT image 18, CT series 3/coronal reformatted image 47, CT series 5). Vascular: No hyperdense vessel or unexpected calcification. Skull: Normal. Negative  for fracture or focal lesion. Sinuses/Orbits: A small, stable posterior right maxillary sinus polyp versus mucous retention cyst is seen. Other: None. IMPRESSION: 1. No acute intracranial abnormality. 2. 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 3. Stable, benign-appearing area of heterogeneous attenuation within the posterior aspect of the right lateral ventricle. Nonemergent MRI correlation is recommended. 4. Small, stable posterior right maxillary sinus polyp versus mucous retention cyst. Electronically Signed   By: Aram Candela M.D.   On: 08/23/2022 00:04   CT Head Wo Contrast  Result Date: 08/22/2022 CLINICAL DATA:  Mental status change, unknown cause EXAM: CT HEAD WITHOUT CONTRAST TECHNIQUE: Contiguous axial images were obtained from the base of the skull through the vertex without intravenous contrast. RADIATION DOSE REDUCTION: This exam was performed according to the departmental dose-optimization program which includes automated exposure control, adjustment of the mA and/or kV according to patient size and/or use of iterative reconstruction technique. COMPARISON:  CT 10/15/2021. FINDINGS: Brain: No evidence of acute large vascular territory infarction, hemorrhage,  hydrocephalus, extra-axial collection or mass lesion/mass effect. Right parietotemporal encephalomalacia. Vascular: No definite focal hyperdense vessel. Calcific atherosclerosis. Skull: No acute fracture. Sinuses/Orbits: Inferior right maxillary sinus retention cyst. Otherwise, clear sinuses. No acute findings. Other: No mastoid effusions. IMPRESSION: 1. No evidence of acute intracranial abnormality. 2. Right parietotemporal encephalomalacia. Electronically Signed   By: Feliberto Harts M.D.   On: 08/22/2022 14:49   CT CHEST ABDOMEN PELVIS W CONTRAST  Result Date: 08/22/2022 CLINICAL DATA:  Sepsis. EXAM: CT CHEST, ABDOMEN, AND PELVIS WITH CONTRAST TECHNIQUE: Multidetector CT imaging of the chest, abdomen and pelvis was performed following the standard protocol during bolus administration of intravenous contrast. RADIATION DOSE REDUCTION: This exam was performed according to the departmental dose-optimization program which includes automated exposure control, adjustment of the mA and/or kV according to patient size and/or use of iterative reconstruction technique. CONTRAST:  OMNIPAQUE IOHEXOL 350 MG/ML SOLN COMPARISON:  September 25, 2021. FINDINGS: CT CHEST FINDINGS Cardiovascular: No significant vascular findings. Normal heart size. No pericardial effusion. Mediastinum/Nodes: No enlarged mediastinal, hilar, or axillary lymph nodes. Thyroid gland, trachea, and esophagus demonstrate no significant findings. Lungs/Pleura: Lungs are clear. No pleural effusion or pneumothorax. Musculoskeletal: No chest wall mass or suspicious bone lesions identified. CT ABDOMEN PELVIS FINDINGS Hepatobiliary: No cholelithiasis or biliary dilatation. Hepatic steatosis. Pancreas: Unremarkable. No pancreatic ductal dilatation or surrounding inflammatory changes. Spleen: Normal in size without focal abnormality. Adrenals/Urinary Tract: Adrenal glands appear normal. Bilateral renal cortical scarring is noted. Small bilateral renal cysts  are noted for which no further follow-up is required. No hydronephrosis or renal obstruction is noted. Urinary bladder is unremarkable. Stomach/Bowel: Stomach is within normal limits. Appendix appears normal. No evidence of bowel wall thickening, distention, or inflammatory changes. Vascular/Lymphatic: Aortic atherosclerosis. No enlarged abdominal or pelvic lymph nodes. Reproductive: Prostate is unremarkable. Other: Small fat containing periumbilical hernia.  No ascites. Musculoskeletal: No acute or significant osseous findings. IMPRESSION: Hepatic steatosis. Bilateral renal cortical scarring. Small fat containing periumbilical hernia. No acute abnormality seen in the abdomen or pelvis. Aortic Atherosclerosis (ICD10-I70.0). Electronically Signed   By: Lupita Raider M.D.   On: 08/22/2022 13:28   DG Chest Portable 1 View  Result Date: 08/22/2022 CLINICAL DATA:  Weakness. Lower abdominal pain, back pain and chills EXAM: PORTABLE CHEST 1 VIEW COMPARISON:  CT AP from 10/15/2021 FINDINGS: Stable cardiomediastinal contours. No pleural effusion or edema. No airspace opacities identified. The visualized osseous structures are  unremarkable. IMPRESSION: 1. No acute findings. Electronically Signed   By: Signa Kell M.D.   On: 08/22/2022 12:07    Microbiology: Recent Results (from the past 240 hour(s))  Resp panel by RT-PCR (RSV, Flu A&B, Covid) Anterior Nasal Swab     Status: None   Collection Time: 08/22/22 11:04 AM   Specimen: Anterior Nasal Swab  Result Value Ref Range Status   SARS Coronavirus 2 by RT PCR NEGATIVE NEGATIVE Final    Comment: (NOTE) SARS-CoV-2 target nucleic acids are NOT DETECTED.  The SARS-CoV-2 RNA is generally detectable in upper respiratory specimens during the acute phase of infection. The lowest concentration of SARS-CoV-2 viral copies this assay can detect is 138 copies/mL. A negative result does not preclude SARS-Cov-2 infection and should not be used as the sole basis for  treatment or other patient management decisions. A negative result may occur with  improper specimen collection/handling, submission of specimen other than nasopharyngeal swab, presence of viral mutation(s) within the areas targeted by this assay, and inadequate number of viral copies(<138 copies/mL). A negative result must be combined with clinical observations, patient history, and epidemiological information. The expected result is Negative.  Fact Sheet for Patients:  BloggerCourse.com  Fact Sheet for Healthcare Providers:  SeriousBroker.it  This test is no t yet approved or cleared by the [place] FDA and  has been authorized for detection and/or diagnosis of SARS-CoV-2 by FDA under an Emergency Use Authorization (EUA). This EUA will remain  in effect (meaning this test can be used) for the duration of the COVID-19 declaration under Section 564(b)(1) of the Act, 21 U.S.C.section 360bbb-3(b)(1), unless the authorization is terminated  or revoked sooner.       Influenza A by PCR NEGATIVE NEGATIVE Final   Influenza B by PCR NEGATIVE NEGATIVE Final    Comment: (NOTE) The Xpert Xpress SARS-CoV-2/FLU/RSV plus assay is intended as an aid in the diagnosis of influenza from Nasopharyngeal swab specimens and should not be used as a sole basis for treatment. Nasal washings and aspirates are unacceptable for Xpert Xpress SARS-CoV-2/FLU/RSV testing.  Fact Sheet for Patients: BloggerCourse.com  Fact Sheet for Healthcare Providers: SeriousBroker.it  This test is not yet approved or cleared by the [place] FDA and has been authorized for detection and/or diagnosis of SARS-CoV-2 by FDA under an Emergency Use Authorization (EUA). This EUA will remain in effect (meaning this test can be used) for the duration of the COVID-19 declaration under Section 564(b)(1) of the Act, 21  U.S.C. section 360bbb-3(b)(1), unless the authorization is terminated or revoked.     Resp Syncytial Virus by PCR NEGATIVE NEGATIVE Final    Comment: (NOTE) Fact Sheet for Patients: BloggerCourse.com  Fact Sheet for Healthcare Providers: SeriousBroker.it  This test is not yet approved or cleared by the [place] FDA and has been authorized for detection and/or diagnosis of SARS-CoV-2 by FDA under an Emergency Use Authorization (EUA). This EUA will remain in effect (meaning this test can be used) for the duration of the COVID-19 declaration under Section 564(b)(1) of the Act, 21 U.S.C. section 360bbb-3(b)(1), unless the authorization is terminated or revoked.  Performed at [facility], [address] [place], [place] [address]   Culture, blood (routine x 2)     Status: Abnormal   Collection Time: 08/22/22 11:45 AM   Specimen: BLOOD LEFT FOREARM  Result Value Ref Range Status   Specimen Description   Final    BLOOD LEFT FOREARM Performed at [facility],  [address] [place], [place] [address]    Special Requests   Final    BOTTLES DRAWN AEROBIC AND ANAEROBIC Blood Culture adequate volume Performed at [facility], [address] [place], [place] [address]    Culture  Setup Time   Final    GRAM NEGATIVE RODS IN BOTH AEROBIC AND ANAEROBIC BOTTLES CRITICAL VALUE NOTED.  VALUE IS CONSISTENT WITH PREVIOUSLY REPORTED AND CALLED VALUE. Performed at [facility], [address] [place], [place] [address]    Culture (A)  Final    ESCHERICHIA COLI SUSCEPTIBILITIES PERFORMED ON PREVIOUS CULTURE WITHIN THE LAST 5 DAYS. Performed at [facility], [address], [place], [place] [address]    Report Status 08/25/2022 FINAL  Final  Culture, blood (routine x 2)     Status: Abnormal   Collection Time: 08/22/22 11:45 AM   Specimen: BLOOD RIGHT FOREARM  Result Value Ref Range Status    Specimen Description   Final    BLOOD RIGHT FOREARM Performed at [facility], [address] [place], [place] [address]    Special Requests   Final    BOTTLES DRAWN AEROBIC AND ANAEROBIC Blood Culture adequate volume Performed at [facility], [address] [place], [place] [address]    Culture  Setup Time   Final    Organism ID to follow GRAM NEGATIVE RODS IN BOTH AEROBIC AND ANAEROBIC BOTTLES CRITICAL RESULT CALLED TO, READ BACK BY AND VERIFIED WITH: [email or web address]  08/22/22 RH Performed at [facility], [address] [place], [place] [address]    Culture ESCHERICHIA COLI (A)  Final   Report Status 08/25/2022 FINAL  Final   Organism ID, Bacteria ESCHERICHIA COLI  Final      Susceptibility   Escherichia coli - MIC*    AMPICILLIN <=2 SENSITIVE Sensitive     CEFEPIME <=0.12 SENSITIVE Sensitive     CEFTAZIDIME <=1 SENSITIVE Sensitive     CEFTRIAXONE <=0.25 SENSITIVE Sensitive     CIPROFLOXACIN <=0.25 SENSITIVE Sensitive     GENTAMICIN <=1 SENSITIVE Sensitive     IMIPENEM <=0.25 SENSITIVE Sensitive     TRIMETH/SULFA <=20 SENSITIVE Sensitive     AMPICILLIN/SULBACTAM <=2 SENSITIVE Sensitive     PIP/TAZO <=4 SENSITIVE Sensitive     * ESCHERICHIA COLI  Blood Culture ID Panel (Reflexed)     Status: Abnormal   Collection Time: 08/22/22 11:45 AM  Result Value Ref Range Status   Enterococcus faecalis NOT DETECTED NOT DETECTED Final   Enterococcus Faecium NOT DETECTED NOT DETECTED Final   Listeria monocytogenes NOT DETECTED NOT DETECTED Final   Staphylococcus species NOT DETECTED NOT DETECTED Final   Staphylococcus aureus (BCID) NOT DETECTED NOT DETECTED Final   Staphylococcus epidermidis NOT DETECTED NOT DETECTED Final   Staphylococcus lugdunensis NOT DETECTED NOT DETECTED Final   Streptococcus species NOT DETECTED NOT DETECTED Final   Streptococcus agalactiae NOT DETECTED NOT DETECTED Final   Streptococcus pneumoniae NOT DETECTED NOT DETECTED Final    Streptococcus pyogenes NOT DETECTED NOT DETECTED Final   A.calcoaceticus-baumannii NOT DETECTED NOT DETECTED Final   Bacteroides fragilis NOT DETECTED NOT DETECTED Final   Enterobacterales DETECTED (A) NOT DETECTED Final    Comment: Enterobacterales represent a large order of gram negative bacteria, not a single organism. CRITICAL RESULT CALLED TO, READ BACK BY AND VERIFIED WITH: Garry [email or web address]  08/22/22 RH    Enterobacter cloacae complex NOT DETECTED NOT DETECTED Final   Escherichia coli DETECTED (A) NOT DETECTED Final  Comment: CRITICAL RESULT CALLED TO, READ BACK BY AND VERIFIED WITH: Ronen [email or web address]  08/22/22 RH    Klebsiella aerogenes NOT DETECTED NOT DETECTED Final   Klebsiella oxytoca NOT DETECTED NOT DETECTED Final   Klebsiella pneumoniae NOT DETECTED NOT DETECTED Final   Proteus species NOT DETECTED NOT DETECTED Final   Salmonella species NOT DETECTED NOT DETECTED Final   Serratia marcescens NOT DETECTED NOT DETECTED Final   Haemophilus influenzae NOT DETECTED NOT DETECTED Final   Neisseria meningitidis NOT DETECTED NOT DETECTED Final   Pseudomonas aeruginosa NOT DETECTED NOT DETECTED Final   Stenotrophomonas maltophilia NOT DETECTED NOT DETECTED Final   Candida albicans NOT DETECTED NOT DETECTED Final   Candida auris NOT DETECTED NOT DETECTED Final   Candida glabrata NOT DETECTED NOT DETECTED Final   Candida krusei NOT DETECTED NOT DETECTED Final   Candida parapsilosis NOT DETECTED NOT DETECTED Final   Candida tropicalis NOT DETECTED NOT DETECTED Final   Cryptococcus neoformans/gattii NOT DETECTED NOT DETECTED Final   CTX-M ESBL NOT DETECTED NOT DETECTED Final   Carbapenem resistance IMP NOT DETECTED NOT DETECTED Final   Carbapenem resistance KPC NOT DETECTED NOT DETECTED Final   Carbapenem resistance NDM NOT DETECTED NOT DETECTED Final   Carbapenem resist OXA 48 LIKE NOT DETECTED NOT DETECTED Final   Carbapenem resistance VIM NOT DETECTED NOT DETECTED Final     Comment: Performed at [facility], [address] [place], [place] [address]  CSF culture w Gram Stain     Status: None   Collection Time: 08/22/22  3:08 PM   Specimen: CSF; Cerebrospinal Fluid  Result Value Ref Range Status   Specimen Description   Final    CSF Performed at [facility], [address] [place], [place] [address]    Special Requests   Final    NONE Performed at [facility], [address] [place], [place] [address]    Gram Stain   Final    WBC SEEN NO ORGANISMS SEEN RED BLOOD CELLS PRESENT Performed at [facility], [address] [place], [place] [address]    Culture   Final    NO GROWTH 3 DAYS Performed at [facility], [address] [place], [place] [address]    Report Status 08/25/2022 FINAL  Final  MRSA Next Gen by PCR, Nasal     Status: None   Collection Time: 08/23/22 12:17 AM   Specimen: Nasal Mucosa; Nasal Swab  Result Value Ref Range Status   MRSA by PCR Next Gen NOT DETECTED NOT DETECTED Final    Comment: (NOTE) The GeneXpert MRSA Assay (FDA approved for NASAL specimens only), is one component of a comprehensive MRSA colonization surveillance program. It is not intended to diagnose MRSA infection nor to guide or monitor treatment for MRSA infections. Test performance is not FDA approved in patients less than 1 years old. Performed at [facility], [address] [place], [place] [address]   C Difficile Quick Screen w PCR reflex     Status: None   Collection Time: 08/23/22  2:50 AM   Specimen: STOOL  Result Value Ref Range Status   C Diff antigen NEGATIVE NEGATIVE Final   C Diff toxin NEGATIVE NEGATIVE Final   C Diff interpretation No C. difficile detected.  Final    Comment: Performed at [facility], [address] [place], [place] [address]  Gastrointestinal Panel by PCR , Stool     Status: Abnormal   Collection Time: 08/23/22  2:50 AM   Specimen: Stool  Result  Value Ref Range Status   Campylobacter species NOT DETECTED NOT DETECTED Final   Plesimonas shigelloides NOT DETECTED NOT DETECTED Final   Salmonella species NOT DETECTED NOT DETECTED Final   Yersinia enterocolitica NOT DETECTED NOT DETECTED Final   Vibrio species NOT DETECTED NOT DETECTED Final   Vibrio cholerae NOT DETECTED NOT DETECTED Final   Enteroaggregative E coli (EAEC) NOT DETECTED NOT DETECTED Final   Enteropathogenic E coli (EPEC) DETECTED (A) NOT DETECTED Final    Comment: RESULT CALLED TO, READ BACK BY AND VERIFIED WITH: NALLELY [email or web address]  08/23/22 RH    Enterotoxigenic E coli (ETEC) NOT DETECTED NOT DETECTED Final   Shiga like toxin producing E coli (STEC) NOT DETECTED NOT DETECTED Final   Shigella/Enteroinvasive E coli (EIEC) NOT DETECTED NOT DETECTED Final   Cryptosporidium NOT DETECTED NOT DETECTED Final   Cyclospora cayetanensis NOT DETECTED NOT DETECTED Final   Entamoeba histolytica NOT DETECTED NOT DETECTED Final   Giardia lamblia NOT DETECTED NOT DETECTED Final   Adenovirus F40/41 NOT DETECTED NOT DETECTED Final   Astrovirus NOT DETECTED NOT DETECTED Final   Norovirus GI/GII NOT DETECTED NOT DETECTED Final   Rotavirus A NOT DETECTED NOT DETECTED Final   Sapovirus (I, II, IV, and V) NOT DETECTED NOT DETECTED Final    Comment: Performed at [facility], [address] [place], [place] [address]  Urine Culture (for pregnant, neutropenic or urologic patients or patients with an indwelling urinary catheter)     Status: None   Collection Time: 08/24/22  2:53 PM   Specimen: Urine, Clean Catch  Result Value Ref Range Status   Specimen Description   Final    URINE, CLEAN CATCH Performed at [facility], [address], [place], [place] [address]    Special Requests   Final    NONE Performed at [facility], [address], [place], [place] [address]    Culture   Final    NO GROWTH Performed at [facility], [address],  [place], [place] [address]    Report Status 08/25/2022 FINAL  Final     Labs: CBC: Recent Labs  Lab 08/22/22 1027 08/23/22 0023 08/24/22 0343 08/25/22 0549  WBC 2.7* 14.6* 10.7* 5.9  NEUTROABS 2.3  --   --   --   HGB 16.1 14.7 14.5 14.3  HCT 48.5 44.3 42.8 42.7  MCV 98.0 97.6 95.3 95.7  PLT 128* 106* 114* 118*   Basic Metabolic Panel: Recent Labs  Lab 08/22/22 1027 08/23/22 0023 08/23/22 0500 08/24/22 0343 08/25/22 0549  NA 139 135  --  138 139  K 4.2 4.5  --  3.7 3.8  CL 104 106  --  110 110  CO2 24 18*  --  20* 21*  GLUCOSE 104* 217*  --  149* 102*  BUN 23* 22*  --  30* 25*  CREATININE 1.65* 1.76*  --  1.31* 1.18  CALCIUM 9.4 8.6*  --  8.8* 9.0  MG 1.6*  --  1.8  --  1.7  PHOS 2.2*  --  2.6  --  3.5   Liver Function Tests: Recent Labs  Lab 08/22/22 1027 08/24/22 0343  AST 42* 33  ALT 38 30  ALKPHOS 127* 74  BILITOT 1.5* 0.8  PROT 7.8 6.5  ALBUMIN 4.3 3.2*   Recent Labs  Lab 08/22/22 1027  LIPASE 33   Recent Labs  Lab 08/22/22 1646  AMMONIA 24  Cardiac Enzymes: No results for input(s): "CKTOTAL", "CKMB", "CKMBINDEX", "TROPONINI" in the last 168 hours. BNP (last 3 results) Recent Labs    10/15/21 2002 08/22/22 1027  BNP 256.2* 45.4   CBG: Recent Labs  Lab 08/22/22 2146 08/22/22 2358  GLUCAP 184* 205*    Time spent: 35 minutes  Signed:  Gillis Santa  [facility] 08/25/2022 2:05 PM

## 2022-08-31 LAB — MISC LABCORP TEST (SEND OUT): Labcorp test code: 9985

## 2023-09-26 NOTE — Progress Notes (Signed)
 Today the history is gathered from: 100% - patient  0% - alone today  REFERRING PHYSICIAN: Epifanio Alm Dudley* PRIMARY CARE PHYSICIAN:  Epifanio Alm Dover, MD  IMPRESSION/PLAN  Dustin Wells is a 58 y.o. male presenting for evaluation of  Hx OF STROKE / HEADACHE / ATRIAL FIBRILLATION / MEMORY LOSS / OSA / SEIZURE-LIKE ACTIVITY / Hx OF SEPSIS / Hx ENCEPHALITIS/ NUMBNESS/ TINGLING - Ongoing.  - Patient with no stroke or seizure like episodes. Headaches occurring twice a month at occipital region upon waking lasting for 1 hour. Ongoing numbness and tingling in left hand and wrist, no radiating. Difficulty with grip strength and apraxia. Cramping in bilateral feet and right knee pain, 4/10. Mild grip strength difficulty and mild difficulty buttoning up clothes. No new memory concerns. Improving restless legs. - Increase Cymbalta to 40 mg nightly. Refill. - Continue Keppra  750 mg twice daily for seizure like activity. Refill. - Continue Gabapentin  1200 mg daily. Taking 300 mg at dinnertime and 900 mg before bed. Refill. - Continue Vimpat  100 mg twice daily for seizure like activity. Refill.   Follow up with Dr. Lane in 1-2 months.    CHIEF COMPLAINT & HPI  Dustin Wells is a 59 y.o. male presenting for evaluation of: Chief Complaint  Patient presents with  . Hx OF STROKE / HEADACHE / ATRIAL FIBRILLATION  . Memory Loss  . Sleep Apnea  . SEIZURE-LIKE ACTIVITY  . Hx OF SEPSIS / Hx ENCEPHALITIS  . Numbness  . Tingling    Hx OF STROKE / HEADACHE / ATRIAL FIBRILLATION / MEMORY LOSS / OSA / SEIZURE-LIKE ACTIVITY / Hx OF SEPSIS / Hx OF ENCEPHALITIS/ NUMBNESS/ TINGLING Patient with no stroke like episodes or seizure like activity. Headaches at occipital region upon waking with increased frequency occurring twice a month lasting less than one hour. Will take Tylenol  at onset, is effective. Photophobia, phonophobia with episodes. Blurry vision, feels this is associated with age. No nausea,  vomiting with headaches. Reduced appetite since starting Cymbalta. Reports cramping in bilateral feet and right knee pain, 4/10. Unchanged left hand numbness and tingling, not radiating up left arm. Mild grip strength difficulty and mild difficulty buttoning up clothes. No new memory concerns. Improving restless legs since being on Gabapentin . Taking Cymbalta 20 mg daily, Gabapentin  1200 mg daily, Keppra  750 mg BID, Vimpat  100 mg BID.   DATA SUMMARY: 08/22/2022 MR BRAIN WO IMPRESSION:  1. No acute intracranial pathology.  2. 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  3. FLAIR hyperintense structure in the atrium of the right lateral  ventricles favored to reflect asymmetric choroid plexus.    08/22/2022 CT HEAD WO IMPRESSION:  1. No acute intracranial abnormality.  2. Chronic right temporoparietal infarct with associated ex vacuo  dilatation of the posterior aspect of the right lateral ventricle.  3. Stable, benign-appearing area of heterogeneous attenuation within  the posterior aspect of the right lateral ventricle. Nonemergent MRI  correlation is recommended.  4. Small, stable posterior right maxillary sinus polyp versus mucous  retention cyst.   08/22/2022 CT HEAD WO IMPRESSION:  1. No evidence of acute intracranial abnormality.  2. Right parietotemporal encephalomalacia.   05/06/2022 3 HOUR EEG IMPRESSION: This 3 hours EEG in the awake and asleep states are within normal limits.   05/03/2022 DIAGNOSTIC SLEEP STUDY REPORT IMPRESSION: This overnight polysomnogram demonstrates that the patient has:  1) Severe obstructive sleep apnea (AHI 83/hr) 2) Baseline hypoxemia was noted with a mean SaO2 of 90%  and SaO2 88% for 119 minutes of the total monitoring time.  The apnea/hypopnea breakdown was as follows: 95.1% obstructive, 1.8% central, 3.1% mixed. The SaO2 nadir during sleep was 63%  04/29/2022 ROUTINE EEG IMPRESSION: This routine EEG in the awake and  asleep states is within normal limits.  10/22/21 DG GUIDED LUMBAR PUNCTURE IMPRESSION:  Technically successful fluoroscopically-guided L4-L5 lumbar  puncture.   Opening pressure: 20 cm water.   16 mL of CSF obtained for laboratory studies.   10/21/21 OVERNIGHT EEG W VIDEO-[place] IMPRESSION:  This study showed evidence of epileptogenicity arising from right  parietal occipital region with increased risk of seizure  recurrence.  No definite seizures were seen during the study.   10/20/21 EEG-[place] IMPRESSION:  This study showed five electrographic seizures arising from right  parieto-occipital region, lasting about 10-15 seconds each. There  is also evidence of epileptogenicity with increased risk of  seizure recurrence.     10/19/21 EEG- IMPRESSION:  This study showed six electrographic seizures arising from right  parieto-occipital region, lasting about 30-45 seconds each. There  is also evidence of epileptogenicity with increased risk of  seizure recurrence. Consider long term eeg monitoring.   10/16/21 MRI BRAIN WO W CONTRAST IMPRESSION:  1. No acute intracranial abnormality or significant interval change.  2. Stable encephalomalacia of the right posterior temporal and  parietal lobe.  3. Scattered subcortical T2 hyperintensities bilaterally are mildly  advanced for age. The finding is nonspecific but can be seen in the  setting of chronic microvascular ischemia, a demyelinating process  such as multiple sclerosis, vasculitis, complicated migraine  headaches, or as the sequelae of a prior infectious or inflammatory  process.   10/15/21 CT HEAD WO CONTRAST IMPRESSION:  Stable remote posterior right MCA territory infarct. No acute  intracranial abnormality or interval change by noncontrast CT.   10/14/21 MRI BRAIN WO CONTRAST IMPRESSION:  1. No acute intracranial abnormality.  2. Chronic Right MCA territory encephalomalacia. Other mild  for age  nonspecific white matter changes.   10/13/21 MRI ANGIO CHEST W WO CONTRAST IMPRESSION:  1. No evidence of aortic dissection, aneurysm or other acute  arterial abnormality.  2. No acute cardiopulmonary process.   10/13/21 CT HEAD WO CONTRAST IMPRESSION:  1. 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  2. No acute intracranial findings or mass lesions.   10/13/21 CT CERVICAL SPINE WO CONTRAST IMPRESSION:  No acute or traumatic finding.  Mild cervical degenerative changes.   01/31/2020 MRI BRAIN WO IMPRESSION:  No acute or subacute finding. Old right MCA territory infarction  affecting the lateral temporal lobe, deep insula and temporoparietal  junction region. This has progressed to atrophy, encephalomalacia  and adjacent gliosis. No abnormality seen to explain headache.    11/27/2019  EMG Impression: Normal study.  There is no electrodiagnostic evidence of a large fiber neuropathy in the left upper extremity.   VISIT SUMMARIES: 01/05/21: Patient with history of stroke in 2018, stable, headaches and afib. Start gabapentin , will call in 300 mg tablets, can take one to three tablets at night. Will place a referral to [facility] to evaluate for sleep apnea and discuss treatment options. Continue eliquis  5 mg twice daily.   MEDICATIONS Current Outpatient Medications  Medication Sig Dispense Refill  . acetaminophen  (TYLENOL ) 325 MG tablet Take 2 tablets (650 mg total) by mouth as needed    . allopurinoL  (ZYLOPRIM ) 100 MG tablet Take 100 mg by mouth once daily    .  amLODIPine (NORVASC) 5 MG tablet take one tablet by mouth one time daily 90 tablet 3  . apixaban  (ELIQUIS ) 5 mg tablet Take 1 tablet (5 mg total) by mouth every 12 (twelve) hours 180 tablet 3  . atorvastatin  (LIPITOR) 40 MG tablet TAKE ONE TABLET BY MOUTH AT BEDTIME 90 tablet 3  . cetirizine HCl (ZYRTEC ORAL) Take by mouth 2 (two) times daily    . cyanocobalamin  (VITAMIN B12) 1000 MCG  tablet Take 1 tablet (1,000 mcg total) by mouth once daily    . dapagliflozin propanediol (FARXIGA) 10 mg tablet Take 10 mg by mouth once daily    . diphenhydramine  HCl (BENADRYL  ORAL) Take by mouth as needed    . DULoxetine (CYMBALTA) 20 MG DR capsule Take 1 capsule (20 mg total) by mouth once daily 30 capsule 1  . flecainide  (TAMBOCOR ) 100 MG tablet take one tablet by mouth every 12 hours 180 tablet 3  . folic acid  (FOLVITE ) 1 MG tablet TAKE ONE TABLET BY MOUTH ONE TIME DAILY 90 tablet 3  . gabapentin  (NEURONTIN ) 300 MG capsule Take 300 mg at dinnertime and 900 mg at before bed for numbness, tingling and restless legs 120 capsule 2  . ketoconazole (NIZORAL) 2 % shampoo Apply topically once daily Leave on for 5 minutes, then rinse. 120 mL 3  . lacosamide  (VIMPAT ) 100 mg tablet Take 1 tablet (100 mg total) by mouth 2 (two) times daily 60 tablet 2  . levETIRAcetam  (KEPPRA ) 750 MG tablet Take 1 tablet (750 mg total) by mouth at bedtime (Patient taking differently: Take 750 mg by mouth every 12 (twelve) hours) 90 tablet 1  . magnesium  oxide (MAG-OX) 400 mg (241.3 mg magnesium ) tablet TAKE ONE TABLET BY MOUTH TWICE A DAY 180 tablet 1  . metoprolol  succinate (TOPROL -XL) 100 MG XL tablet Take 1 tablet (100 mg total) by mouth once daily    . pantoprazole  (PROTONIX ) 40 MG DR tablet Take 1 tablet (40 mg total) by mouth once daily 90 tablet 1  . clotrimazole-betamethasone (LOTRISONE) 1-0.05 % cream Apply topically 2 (two) times daily (Patient not taking: Reported on 09/26/2023) 45 g 3  . LORazepam  (ATIVAN ) 2 MG tablet TAKE ONE TABLET BY MOUTH AT BEDTIME AS NEEDED FOR ANXIETY (Patient not taking: Reported on 09/26/2023) 30 tablet 2   No current facility-administered medications for this visit.    ALLERGIES Allergies  Allergen Reactions  . Bactrim [Sulfamethoxazole-Trimethoprim] Rash  . Xarelto [Rivaroxaban] Nausea    Nausea, diarrhea, abdominal pain     EXAM   Vitals:   09/26/23 1338  BP: (!) 132/90   Weight: (!) 122 kg (269 lb)  Height: 182.9 cm (6')  PainSc:   4  PainLoc: Knee      Body mass index is 36.48 kg/m.    GENERAL: Pleasant male, no acute distress.  Normocephalic and atraumatic.  MUSCULOSKELETAL: Bulk - Normal Tone - Normal Pronator Drift - Absent bilaterally. Ambulation - Gait and station is not assessed today. Romberg - not assessed today. Mild apraxia in left hand.  R/L 5/5    Shoulder abduction (deltoid/supraspinatus, axillary/suprascapular n, C5) 5/5    Elbow flexion (biceps brachii, musculoskeletal n, C5-6) 5/5    Elbow extension (triceps, radial n, C7) 5/5    Finger adduction (interossei, ulnar n, T1)  5/5    Hip flexion (iliopsoas, L1/L2) 5/5    Knee flexion (hamstrings, sciatic n, L5/S1)  5/5    Knee extension (quadriceps, femoral n, L3/4) 5/5    Ankle dorsiflexion (  tibialis anterior, deep fibular n, L4/5) 5/5    Ankle plantarflexion (gastroc, tibial n, S1)   NEUROLOGICAL: MENTAL STATUS: Patient is oriented to person, place and time.   Short-term memory is not assessed today. Long-term memory is intact.   Attention span and concentration are intact.   Naming and repetition are intact. Comprehension is intact.   Expressive speech is intact.   Patient's fund of knowledge is within normal limits for educational level.  CRANIAL NERVES: Visual acuity and visual fields are intact         Extraocular muscles are intact                        Facial sensation is intact bilaterally                Facial strength is intact bilaterally                   Hearing is intact bilaterally                              Palate elevates midline, normal phonation     Shoulder shrug strength is intact                    Tongue protrudes midline                       SENSATION: Pain and temperature (spinothalamic tracts) is normal. Position and vibration (dorsal columns) is normal.  REFLEXES: R/L 2+/2+    Biceps 2+/2+    Brachioradialis  2+/2+     Patellar 2+/2+    Achilles  COORDINATION/CEREBELLAR: Finger to nose testing is not assessed today.      PAST MEDICAL HISTORY Past Medical History:  Diagnosis Date  . Anxiety   . Arrhythmia   . Atrial fibrillation (CMS/HHS-HCC)   . CHF (congestive heart failure) (CMS/HHS-HCC)   . Chronic kidney disease   . Depression   . Encephalitis (HHS-HCC)   . GERD (gastroesophageal reflux disease)   . History of stroke   . Hyperlipidemia   . Hypertension   . Kidney stones   . Sleep apnea   . Testicular cancer (CMS/HHS-HCC) 2013    PAST SURGICAL HISTORY Past Surgical History:  Procedure Laterality Date  . testicle removal Left 2013  . Colon @ [facility]  09/02/2021   Diverticulosis/Rectal mucosa w/ mild hypperplastic changes/Repeat 75yrs/[facility]  . COLONOSCOPY    . KNEE ARTHROSCOPY Left    meniscus tear  . UPPER GASTROINTESTINAL ENDOSCOPY      FAMILY HISTORY Family History  Problem Relation Name Age of Onset  . Nephrolithiasis Mother Mom   . Bipolar disorder Mother Mom   . High blood pressure (Hypertension) Mother Mom   . Nephrolithiasis Father    . Heart failure Father    . Heart failure Sister    . Heart failure Brother    . Heart failure Sister    . Colon cancer Neg Hx    . Colon polyps Neg Hx      SOCIAL HISTORY  Social History   Tobacco Use  . Smoking status: Never  . Smokeless tobacco: Never  Vaping Use  . Vaping status: Never Used  Substance Use Topics  . Alcohol use: Not Currently    Alcohol/week: 0.0 standard drinks of alcohol  . Drug use: Never     REVIEW OF SYSTEMS:  13 system ROS form was given to the patient to complete and I have reviewed it.  The form was sent for scan to the patient's EHR.  Pertinent positives and negatives are mentioned above in the HPI and all other systems are negative.   DATA  I have personally reviewed all of the data outlined below both prior to the appointment and during the appointment with the patient as  appropriate.    EXAM:  MRI HEAD WITHOUT CONTRAST   TECHNIQUE:  Multiplanar, multiecho pulse sequences of the brain and surrounding  structures were obtained without intravenous contrast.   COMPARISON:  None.   FINDINGS:  Brain: Diffusion imaging does not show any acute or subacute  infarction. No focal abnormality affects the brainstem or  cerebellum. Somewhat layering degeneration of the right mid brain.  Left cerebral hemisphere shows few small foci of T2 and FLAIR signal  in the white matter consistent with mild small vessel change. Right  hemisphere shows an old infarction of the lateral temporal lobe,  deep insula and temporoparietal junction region. This has progressed  to atrophy, encephalomalacia and adjacent gliosis. No sign of  obstructive hydrocephalus. Ex vacuo enlargement of the atrium of the  right lateral ventricle. No extra-axial fluid collection. Minor  hemosiderin deposition in the region of the old right MCA territory  stroke.   Vascular: Major vessels at the base of the brain show flow.   Skull and upper cervical spine: Negative   Sinuses/Orbits: Clear except for an insignificant retention cyst in  the right maxillary sinus. Orbits negative.   Other: None   IMPRESSION:  No acute or subacute finding. Old right MCA territory infarction  affecting the lateral temporal lobe, deep insula and temporoparietal  junction region. This has progressed to atrophy, encephalomalacia  and adjacent gliosis. No abnormality seen to explain headache.    Office Visit on 07/18/2023  Component Date Value Ref Range Status  . Hemoglobin A1C 07/18/2023 5.8 (H)  4.2 - 5.6 % Final  . Average Blood Glucose (Calc) 07/18/2023 120  mg/dL Final   No follow-ups on file.  Payor: BLUE CROSS OOS / Plan: [employer] OOS IN NETWORK / Product Type: PPO /    This note is partially written by Greig Pouch, scribe, in the presence of and acting as the scribe of Dr. Arthea Farrow.   I have  reviewed, edited and added to the note as needed to reflect my best personal medical judgment.    Dr. Arthea Farrow, MD [facility] [place], [place] Ph:  [phone] Fax:  [phone]
